# Patient Record
Sex: Female | Born: 1940 | Race: White | Hispanic: No | Smoking: Former smoker
Health system: Southern US, Community
[De-identification: ages and names within clinical notes are randomized; demographics above are authoritative.]

## PROBLEM LIST (undated history)

## (undated) DIAGNOSIS — F32A Depression, unspecified: Secondary | ICD-10-CM

## (undated) DIAGNOSIS — F418 Other specified anxiety disorders: Secondary | ICD-10-CM

## (undated) DIAGNOSIS — K219 Gastro-esophageal reflux disease without esophagitis: Secondary | ICD-10-CM

## (undated) DIAGNOSIS — S7291XA Unspecified fracture of right femur, initial encounter for closed fracture: Secondary | ICD-10-CM

## (undated) DIAGNOSIS — E43 Unspecified severe protein-calorie malnutrition: Secondary | ICD-10-CM

## (undated) DIAGNOSIS — H3530 Unspecified macular degeneration: Secondary | ICD-10-CM

## (undated) DIAGNOSIS — IMO0001 Reserved for inherently not codable concepts without codable children: Secondary | ICD-10-CM

## (undated) DIAGNOSIS — C539 Malignant neoplasm of cervix uteri, unspecified: Secondary | ICD-10-CM

## (undated) DIAGNOSIS — S7290XA Unspecified fracture of unspecified femur, initial encounter for closed fracture: Secondary | ICD-10-CM

## (undated) DIAGNOSIS — M810 Age-related osteoporosis without current pathological fracture: Secondary | ICD-10-CM

## (undated) DIAGNOSIS — S22080D Wedge compression fracture of T11-T12 vertebra, subsequent encounter for fracture with routine healing: Secondary | ICD-10-CM

## (undated) DIAGNOSIS — G250 Essential tremor: Secondary | ICD-10-CM

## (undated) DIAGNOSIS — F332 Major depressive disorder, recurrent severe without psychotic features: Secondary | ICD-10-CM

## (undated) DIAGNOSIS — G621 Alcoholic polyneuropathy: Secondary | ICD-10-CM

## (undated) DIAGNOSIS — F329 Major depressive disorder, single episode, unspecified: Secondary | ICD-10-CM

## (undated) HISTORY — DX: Unspecified severe protein-calorie malnutrition: E43

## (undated) HISTORY — DX: Reserved for inherently not codable concepts without codable children: IMO0001

## (undated) HISTORY — DX: Alcoholic polyneuropathy: G62.1

## (undated) HISTORY — PX: ANKLE SURGERY: SHX546

## (undated) HISTORY — PX: CATARACT EXTRACTION: SUR2

## (undated) HISTORY — DX: Major depressive disorder, recurrent severe without psychotic features: F33.2

## (undated) HISTORY — DX: Unspecified macular degeneration: H35.30

## (undated) HISTORY — DX: Other specified anxiety disorders: F41.8

## (undated) HISTORY — DX: Malignant neoplasm of cervix uteri, unspecified: C53.9

## (undated) HISTORY — DX: Age-related osteoporosis without current pathological fracture: M81.0

## (undated) HISTORY — DX: Essential tremor: G25.0

## (undated) HISTORY — PX: TONSILLECTOMY: SHX5217

## (undated) HISTORY — DX: Gastro-esophageal reflux disease without esophagitis: K21.9

## (undated) HISTORY — DX: Major depressive disorder, single episode, unspecified: F32.9

## (undated) HISTORY — DX: Unspecified fracture of unspecified femur, initial encounter for closed fracture: S72.90XA

## (undated) HISTORY — DX: Unspecified fracture of right femur, initial encounter for closed fracture: S72.91XA

## (undated) HISTORY — DX: Depression, unspecified: F32.A

## (undated) HISTORY — DX: Wedge compression fracture of t11-T12 vertebra, subsequent encounter for fracture with routine healing: S22.080D

---

## 1978-06-08 HISTORY — PX: CERVIX SURGERY: SHX593

## 2002-10-12 ENCOUNTER — Encounter: Payer: Self-pay | Admitting: Urology

## 2002-10-18 ENCOUNTER — Inpatient Hospital Stay (HOSPITAL_COMMUNITY): Admission: RE | Admit: 2002-10-18 | Discharge: 2002-10-20 | Payer: Self-pay | Admitting: Urology

## 2002-10-19 ENCOUNTER — Encounter: Payer: Self-pay | Admitting: Urology

## 2003-03-18 ENCOUNTER — Inpatient Hospital Stay (HOSPITAL_COMMUNITY): Admission: EM | Admit: 2003-03-18 | Discharge: 2003-03-22 | Payer: Self-pay | Admitting: Emergency Medicine

## 2003-03-18 ENCOUNTER — Encounter: Payer: Self-pay | Admitting: Orthopedic Surgery

## 2003-03-18 ENCOUNTER — Encounter: Payer: Self-pay | Admitting: Emergency Medicine

## 2003-06-09 HISTORY — PX: FEMUR SURGERY: SHX943

## 2004-06-08 HISTORY — PX: HIP SURGERY: SHX245

## 2005-08-13 ENCOUNTER — Ambulatory Visit: Payer: Self-pay | Admitting: Physical Medicine & Rehabilitation

## 2005-08-13 ENCOUNTER — Inpatient Hospital Stay (HOSPITAL_COMMUNITY): Admission: AD | Admit: 2005-08-13 | Discharge: 2005-08-17 | Payer: Self-pay | Admitting: Orthopedic Surgery

## 2005-09-04 ENCOUNTER — Ambulatory Visit (HOSPITAL_COMMUNITY): Admission: RE | Admit: 2005-09-04 | Discharge: 2005-09-04 | Payer: Self-pay | Admitting: Internal Medicine

## 2011-03-27 ENCOUNTER — Encounter (INDEPENDENT_AMBULATORY_CARE_PROVIDER_SITE_OTHER): Payer: Self-pay | Admitting: General Surgery

## 2011-03-27 ENCOUNTER — Ambulatory Visit (INDEPENDENT_AMBULATORY_CARE_PROVIDER_SITE_OTHER): Payer: Medicare Other | Admitting: General Surgery

## 2011-03-27 VITALS — BP 122/70 | HR 80 | Temp 98.0°F | Resp 20 | Ht 62.5 in | Wt 111.4 lb

## 2011-03-27 DIAGNOSIS — L723 Sebaceous cyst: Secondary | ICD-10-CM

## 2011-03-27 NOTE — Progress Notes (Signed)
Subjective:     Patient ID: Jody Moore, female   DOB: 12/29/1940, 70 y.o.   MRN: [id number]  HPIThe patient is referred for a little sebaceous cyst on the left neck posterior supraclavicular area it has been present for 2 more years and has never been tender. This was ultrasound ordered by the referring physician shows that as a sebaceous cyst the patient said that she would like to get the area removed today if possible. She is not on a type of blood thinners and we were able to remove it with local anesthesia in the minor surgery room to day  Review of Systems No current outpatient prescriptions on file.       Objective:   Physical ExamBP 122/70  Pulse 80  Temp 98 F (36.7 C)  Resp 20  Ht 5' 2.5" (1.588 m)  Wt 111 lb 6 oz (50.519 kg)  BMI 20.05 kg/m2  Patient has approximately 2-3 cm sebaceous cyst on the left neck area superficial and I excised it with local anesthesia in the minor surgery room. I did have some of the debris since the overlying skin was then but very minimal bleeding and put 3 5-0 nylon sutures to Approximate the wound. She'll start washing the air and upon little Neosporin tomorrow and return to see me for suture removal in one week we did not send the tissue to be examined since it was obviously a sebaceous cyst that does not appear to be actively infected but it did have a lot of odor and the skin debris    Assessment:     Removal of sebaceous cyst left neck with local anesthesia    Plan:     One week

## 2011-03-27 NOTE — Patient Instructions (Signed)
Washed area daily starting tomorrow and apply a little but of Neosporin to the incision I will remove her sutures in approximately one way and we will not send the tissue for pathology examination.

## 2011-04-03 ENCOUNTER — Ambulatory Visit (INDEPENDENT_AMBULATORY_CARE_PROVIDER_SITE_OTHER): Payer: Medicare Other | Admitting: General Surgery

## 2011-04-03 ENCOUNTER — Encounter (INDEPENDENT_AMBULATORY_CARE_PROVIDER_SITE_OTHER): Payer: Self-pay | Admitting: General Surgery

## 2011-04-03 VITALS — BP 126/62 | HR 68 | Temp 98.6°F | Resp 18 | Ht 62.5 in | Wt 111.0 lb

## 2011-04-03 DIAGNOSIS — L723 Sebaceous cyst: Secondary | ICD-10-CM

## 2011-04-03 NOTE — Patient Instructions (Signed)
Keep the Steri-Strips on for approximately one we. If you have increased swelling or tenderness please call for followup appointment otherwise see [place] on a p.r.n. basis

## 2011-04-03 NOTE — Progress Notes (Signed)
Subjective:     Patient ID: Jody Moore, female   DOB: November 06, 1940, 70 y.o.   MRN: [id number]  HPIPatient is here for suture removal she had a 3 cm sebaceous cyst of the left neck the removed here in the office and local anesthesia last Friday we did not sent for path report since there was obviously a big sebaceous cyst which she's material and I placed 3 5-0 nylon sutures incision appears to be unsatisfactory and more sutures placed 2 half-inch Steri-Strips quadrant Steri-Strips and hopefully the strips was then placed in one-week she can see [place] on a p.r.n. basis   Review of Systems     Objective:   Physical Exam BP 126/62  Pulse 68  Temp(Src) 98.6 F (37 C) (Temporal)  Resp 18  Ht 5' 2.5" (1.588 m)  Wt 111 lb (50.349 kg)  BMI 19.98 kg/m2     Assessment:    Satisfactory healing now one week status post removal of a large inflamed sebaceous cyst it did present to 3 years from the left lower neck     Plan:     Return prn

## 2013-10-04 ENCOUNTER — Ambulatory Visit (INDEPENDENT_AMBULATORY_CARE_PROVIDER_SITE_OTHER): Payer: Medicare Other | Admitting: Neurology

## 2013-10-04 ENCOUNTER — Encounter: Payer: Self-pay | Admitting: Neurology

## 2013-10-04 VITALS — BP 148/78 | HR 60 | Resp 14 | Ht 62.5 in | Wt 108.0 lb

## 2013-10-04 DIAGNOSIS — G621 Alcoholic polyneuropathy: Secondary | ICD-10-CM

## 2013-10-04 DIAGNOSIS — G252 Other specified forms of tremor: Secondary | ICD-10-CM

## 2013-10-04 DIAGNOSIS — G250 Essential tremor: Secondary | ICD-10-CM

## 2013-10-04 HISTORY — DX: Alcoholic polyneuropathy: G62.1

## 2013-10-04 HISTORY — DX: Essential tremor: G25.0

## 2013-10-04 NOTE — Progress Notes (Signed)
Note faxed to [facility] at [phone]. Confirmation received.

## 2013-10-04 NOTE — Patient Instructions (Signed)
1.  Let us know if tremor gets bad enough that you decide you want treatment 2.  You need to slowly taper your alcohol use and ultimately discontinue it.  I would recommend you talk to your primary care physician about how to do this safely
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
## 2014-05-14 ENCOUNTER — Telehealth: Payer: Self-pay | Admitting: Neurology

## 2014-05-14 NOTE — Telephone Encounter (Signed)
Called and made sister aware that we do not have a signed release on file so I can not discuss her sister's medical information with her. She expressed understanding. States she thinks her sister is depressed, so she will encourage her to call us but she doesn't think she will. I encouraged that if patient is having a change in symptoms, they should make an appt to come in and discuss.

## 2014-05-14 NOTE — Telephone Encounter (Signed)
Is this new or the same problem she was having in April?  Make sure you have a release to talk with sister

## 2014-05-14 NOTE — Telephone Encounter (Signed)
Sunday Spillers, pt's sister called wanting a referral for pt for physical therapy due to her weak legs. Please call Sunday Spillers # [phone]

## 2014-05-14 NOTE — Telephone Encounter (Signed)
Have not seen patient since April- Okay to refer?

## 2014-10-09 DIAGNOSIS — Z1211 Encounter for screening for malignant neoplasm of colon: Secondary | ICD-10-CM | POA: Diagnosis not present

## 2014-10-09 DIAGNOSIS — D120 Benign neoplasm of cecum: Secondary | ICD-10-CM | POA: Diagnosis not present

## 2014-10-09 DIAGNOSIS — D128 Benign neoplasm of rectum: Secondary | ICD-10-CM | POA: Diagnosis not present

## 2014-10-09 DIAGNOSIS — D122 Benign neoplasm of ascending colon: Secondary | ICD-10-CM | POA: Diagnosis not present

## 2014-10-09 DIAGNOSIS — D123 Benign neoplasm of transverse colon: Secondary | ICD-10-CM | POA: Diagnosis not present

## 2015-07-10 ENCOUNTER — Ambulatory Visit (INDEPENDENT_AMBULATORY_CARE_PROVIDER_SITE_OTHER): Payer: Medicare Other | Admitting: Internal Medicine

## 2015-07-10 ENCOUNTER — Encounter: Payer: Self-pay | Admitting: Internal Medicine

## 2015-07-10 VITALS — BP 120/72 | HR 77 | Temp 98.5°F | Resp 20 | Ht 63.0 in | Wt 125.8 lb

## 2015-07-10 DIAGNOSIS — M810 Age-related osteoporosis without current pathological fracture: Secondary | ICD-10-CM | POA: Diagnosis not present

## 2015-07-10 DIAGNOSIS — K219 Gastro-esophageal reflux disease without esophagitis: Secondary | ICD-10-CM

## 2015-07-10 DIAGNOSIS — H3530 Unspecified macular degeneration: Secondary | ICD-10-CM | POA: Diagnosis not present

## 2015-07-10 DIAGNOSIS — G621 Alcoholic polyneuropathy: Secondary | ICD-10-CM

## 2015-07-10 DIAGNOSIS — Z1239 Encounter for other screening for malignant neoplasm of breast: Secondary | ICD-10-CM

## 2015-07-10 DIAGNOSIS — Z23 Encounter for immunization: Secondary | ICD-10-CM | POA: Diagnosis not present

## 2015-07-10 DIAGNOSIS — F332 Major depressive disorder, recurrent severe without psychotic features: Secondary | ICD-10-CM | POA: Diagnosis not present

## 2015-07-10 DIAGNOSIS — Z136 Encounter for screening for cardiovascular disorders: Secondary | ICD-10-CM | POA: Diagnosis not present

## 2015-07-10 DIAGNOSIS — E2839 Other primary ovarian failure: Secondary | ICD-10-CM

## 2015-07-10 DIAGNOSIS — G250 Essential tremor: Secondary | ICD-10-CM | POA: Diagnosis not present

## 2015-07-10 HISTORY — DX: Age-related osteoporosis without current pathological fracture: M81.0

## 2015-07-10 HISTORY — DX: Major depressive disorder, recurrent severe without psychotic features: F33.2

## 2015-07-10 HISTORY — DX: Unspecified macular degeneration: H35.30

## 2015-07-10 HISTORY — DX: Gastro-esophageal reflux disease without esophagitis: K21.9

## 2015-07-10 MED ORDER — GABAPENTIN 100 MG PO CAPS: 100.0000 mg | ORAL_CAPSULE | Freq: Every day | ORAL | Status: DC

## 2015-07-10 MED ORDER — PAROXETINE HCL 40 MG PO TABS: 40.0000 mg | ORAL_TABLET | ORAL | Status: DC

## 2015-07-10 NOTE — Patient Instructions (Signed)
Increase paxil 40mg  daily  Start gabapentin 100mg  at bedtime for tremors and neuropathy  Continue other medications as ordered  Avoid alcohol use   Will call with lab results  Will call with imaging referrals  Follow up in 3 mos for CPE/ECG/MMSE

## 2015-07-10 NOTE — Progress Notes (Signed)
Patient ID: Jody Moore, female   DOB: March 05, 1941, 75 y.o.   MRN: [id number]    Location:    [facility]   Place of Service:   OFFICE  Chief Complaint  Patient presents with  . Establish Care    HPI:  75 yo female seen today as a new pt.  Depression - she feels overwhelmed at times due to frail spouse who has dementia. She takes her spouse's paxil until her appt today. She is having breakthrough sx's and feels she needs a higher dose. She increased anxiety off meds along with shaking tremors. No SI/HI  Osteoporosis - takes Calcium supplements. Last DXA about 4 yrs ago.  GERD - stable. Takes tums as Omeprazole causes hives  Hx cervical cancer - she was tx with XRT with radium implants and 2 weeks of cobalt in the 1970s.. She has been cancer free x many years  Macular degeneration - followed by Dr [place]. She had OU cats with IOL about 3 yrs ago. Vision is impaired  Essential tremor - worsening. Has seen Neurology in the past. It is difficult to sign paperwork. 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 She stopped heavy drinking in 2015. She has unstable gait due to arthritic condition from right hip fx that did not heal properly. Uses cane to ambulate  Past Medical History  Diagnosis Date  . Osteoporosis   . Reflux   . Depression   . Cervical cancer [facility])     Past Surgical History  Procedure Laterality Date  . Tonsillectomy    . Femur surgery  2005    fractured right femur   . Hip surgery  2006    fractured right hip   . Cervix surgery  1980    cancer-radiation   . Ankle surgery    . Cataract extraction      Patient Care Team: Merrilee Seashore, MD as PCP - General (Internal Medicine)  Social History   Social History  . Marital Status: Single    Spouse Name: N/A  . Number of Children: N/A  . Years of Education: N/A   Occupational History  . 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 Smoking status: Former  Smoker -- 2.00 packs/day    Types: Cigarettes    Quit date: 06/08/2013  . Smokeless tobacco: Never Used  . Alcohol Use: Yes     Comment: 3-4 glasses wine/day  . Drug Use: No  . Sexual Activity: Not on file   Other Topics Concern  . Not on file   Social History Narrative   Diet:   Do you drink/eat things with caffeine? yes   Marital status:  Married                            What year were you married? 2015   Do you live in a house, apartment, assisted living, condo, trailer, etc)? house   Is it one or more stories? yes   How many persons live in your home? 2  Do you have any pets in your home?  2 cats   Current or past profession: Hospital doctor   Do you exercise?    no                                                 Type & how often:   Do you have a living will?  no   Do you have a DNR Form?  no   Do you have a POA/HPOA forms?  no        reports that she quit smoking about 2 years ago. Her smoking use included Cigarettes. She smoked 2.00 packs per day. She has never used smokeless tobacco. She reports that she drinks alcohol. She reports that she does not use illicit drugs.  Family History  Problem Relation Age of Onset  . Heart disease Father    Family Status  Relation Status Death Age  . Mother Alive     HTN, diabetes  . Father Deceased 6    cancer  . Sister Alive     healthy     There is no immunization history on file for this patient.  Allergies  Allergen Reactions  . Coumadin [Warfarin Sodium] Rash    Medications: Patient's Medications  New Prescriptions   No medications on file  Previous Medications   CALCIUM CARBONATE-VITAMIN D (CALCIUM + D PO)    Take by mouth.   MULTIPLE VITAMIN (MULTIVITAMIN) TABLET    Take 1 tablet by mouth daily.   OMEPRAZOLE (PRILOSEC) 40 MG CAPSULE    Take 40 mg by mouth daily.    PAROXETINE (PAXIL) 20 MG TABLET    Take 20 mg by  mouth daily.  Modified Medications   No medications on file  Discontinued Medications   No medications on file    Review of Systems  Constitutional: Positive for fatigue.  HENT: Positive for dental problem (wears dentures).        Loss of taste   Eyes: Positive for visual disturbance (due to macular degeneration).       Dry eyes  Gastrointestinal: Positive for diarrhea.  Genitourinary:       Weak urine stream  Skin:       itchy  Neurological: Positive for tremors and weakness.       Loss of balance  Hematological: Bruises/bleeds easily.  Psychiatric/Behavioral: Positive for dysphoric mood (depression; mood swings).  All other systems reviewed and are negative.   Filed Vitals:   07/10/15 1102  BP: 120/72  Pulse: 77  Temp: 98.5 F (36.9 C)  TempSrc: Oral  Resp: 20  Height: 5\' 3"  (1.6 m)  Weight: 125 lb 12.8 oz (57.063 kg)  SpO2: 93%   Body mass index is 22.29 kg/(m^2).  Physical Exam  Constitutional: She is oriented to person, place, and time. She appears well-developed.  Frail appearing in NAD  HENT:  Mouth/Throat: Oropharynx is clear and moist. No oropharyngeal exudate.  Eyes: Pupils are equal, round, and reactive to light. No scleral icterus.  Neck: Neck supple. Carotid bruit is not present. No tracheal deviation present.  Cardiovascular: Normal rate, regular rhythm, normal heart sounds and intact distal pulses.  Exam reveals no gallop and no friction rub.   No murmur heard. No LE edema b/l. no calf TTP.   Pulmonary/Chest: Effort normal and breath sounds normal. No stridor.  No respiratory distress. She has no wheezes. She has no rales.  Abdominal: Soft. Bowel sounds are normal. She exhibits no distension and no mass. There is no hepatomegaly. There is no tenderness. There is no rebound and no guarding.  Musculoskeletal: She exhibits edema and tenderness.  Right hip internally rotated. RLE contracture present  Lymphadenopathy:    She has no cervical adenopathy.    Neurological: She is alert and oriented to person, place, and time. She displays tremor (resting). Gait (unsteady) abnormal.  Skin: Skin is warm and dry. No rash noted.  Psychiatric: She has a normal mood and affect. Her behavior is normal. Judgment and thought content normal.     Labs reviewed: No results found for any previous visit.  No results found.   Assessment/Plan   ICD-9-CM ICD-10-CM   1. Severe episode of recurrent major depressive disorder, without psychotic features ([place]) 296.33 F33.2 CMP     CBC with Differential     PARoxetine (PAXIL) 40 MG tablet  2. Essential tremor 333.1 G25.0 CMP     TSH     gabapentin (NEURONTIN) 100 MG capsule  3. Gastroesophageal reflux disease without esophagitis 530.81 K21.9   4. 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 Osteoporosis 733.00 M81.0 DG Bone Density  6. Macular degeneration of both eyes 362.50 H35.30   7. Screening for cardiovascular condition V81.2 Z13.6 Lipid Panel  8. Breast cancer screening V76.10 Z12.39 MM DIGITAL SCREENING BILATERAL   Increase paxil 40mg  daily  Start gabapentin 100mg  at bedtime for tremors and neuropathy  Pneumovax and influenza vaccine given today  Continue other medications as ordered  Avoid alcohol use   Will call with lab results  Will call with imaging referrals  Follow up in 3 mos for CPE/ECG/MMSE  Tibor Lemmons S. Perlie Gold  [facility] [address] [place], [place] [address] [phone] Cell (Monday-Friday 8 AM - 5 PM) [phone] After 5 PM and follow prompts

## 2015-07-11 ENCOUNTER — Ambulatory Visit (INDEPENDENT_AMBULATORY_CARE_PROVIDER_SITE_OTHER): Payer: Medicare Other

## 2015-07-11 ENCOUNTER — Telehealth: Payer: Self-pay

## 2015-07-11 VITALS — HR 73 | Temp 97.3°F | Resp 20 | Wt 125.8 lb

## 2015-07-11 DIAGNOSIS — T7840XA Allergy, unspecified, initial encounter: Secondary | ICD-10-CM | POA: Diagnosis not present

## 2015-07-11 LAB — CBC WITH DIFFERENTIAL/PLATELET
BASOS ABS: 0.1 10*3/uL (ref 0.0–0.2)
Basos: 1 %
EOS (ABSOLUTE): 0.2 10*3/uL (ref 0.0–0.4)
Eos: 2 %
Hematocrit: 37.4 % (ref 34.0–46.6)
Hemoglobin: 12.1 g/dL (ref 11.1–15.9)
IMMATURE GRANS (ABS): 0 10*3/uL (ref 0.0–0.1)
Immature Granulocytes: 0 %
LYMPHS: 20 %
Lymphocytes Absolute: 2 10*3/uL (ref 0.7–3.1)
MCH: 30.3 pg (ref 26.6–33.0)
MCHC: 32.4 g/dL (ref 31.5–35.7)
MCV: 94 fL (ref 79–97)
Monocytes Absolute: 0.9 10*3/uL (ref 0.1–0.9)
Monocytes: 9 %
NEUTROS ABS: 7 10*3/uL (ref 1.4–7.0)
NEUTROS PCT: 68 %
PLATELETS: 399 10*3/uL — AB (ref 150–379)
RBC: 3.99 x10E6/uL (ref 3.77–5.28)
RDW: 14.3 % (ref 12.3–15.4)
WBC: 10.1 10*3/uL (ref 3.4–10.8)

## 2015-07-11 LAB — COMPREHENSIVE METABOLIC PANEL
A/G RATIO: 1.4 (ref 1.1–2.5)
ALBUMIN: 3.8 g/dL (ref 3.5–4.8)
ALK PHOS: 97 IU/L (ref 39–117)
ALT: 7 IU/L (ref 0–32)
AST: 18 IU/L (ref 0–40)
BILIRUBIN TOTAL: 0.5 mg/dL (ref 0.0–1.2)
BUN / CREAT RATIO: 14 (ref 11–26)
BUN: 9 mg/dL (ref 8–27)
CHLORIDE: 100 mmol/L (ref 96–106)
CO2: 21 mmol/L (ref 18–29)
Calcium: 9.5 mg/dL (ref 8.7–10.3)
Creatinine, Ser: 0.66 mg/dL (ref 0.57–1.00)
GFR calc Af Amer: 101 mL/min/{1.73_m2} (ref >59)
GFR calc non Af Amer: 87 mL/min/{1.73_m2} (ref >59)
GLOBULIN, TOTAL: 2.7 g/dL (ref 1.5–4.5)
Glucose: 93 mg/dL (ref 65–99)
POTASSIUM: 4.7 mmol/L (ref 3.5–5.2)
SODIUM: 141 mmol/L (ref 134–144)
Total Protein: 6.5 g/dL (ref 6.0–8.5)

## 2015-07-11 LAB — HEMOGLOBIN A1C
Est. average glucose Bld gHb Est-mCnc: 128 mg/dL
HEMOGLOBIN A1C: 6.1 % — AB (ref 4.8–5.6)

## 2015-07-11 LAB — TSH: TSH: 1.39 u[IU]/mL (ref 0.450–4.500)

## 2015-07-11 LAB — LIPID PANEL
CHOL/HDL RATIO: 3.1 ratio (ref 0.0–4.4)
Cholesterol, Total: 237 mg/dL — ABNORMAL HIGH (ref 100–199)
HDL: 76 mg/dL (ref >39)
LDL CALC: 132 mg/dL — AB (ref 0–99)
TRIGLYCERIDES: 147 mg/dL (ref 0–149)
VLDL Cholesterol Cal: 29 mg/dL (ref 5–40)

## 2015-07-11 MED ORDER — PREDNISONE 10 MG (21) PO TBPK: 10.0000 mg | ORAL_TABLET | Freq: Every day | ORAL | Status: DC

## 2015-07-11 MED ORDER — METHYLPREDNISOLONE ACETATE 40 MG/ML IJ SUSP
40.0000 mg | Freq: Once | INTRAMUSCULAR | Status: AC
  Administered 2015-07-11: 40 mg via INTRAMUSCULAR

## 2015-07-11 MED ORDER — CETIRIZINE HCL 10 MG PO TABS: 10.0000 mg | ORAL_TABLET | Freq: Every day | ORAL | Status: DC

## 2015-07-11 NOTE — Telephone Encounter (Signed)
Per Dr.Carter: 1.) Nurse Visit for Depo Medrol 40 mg.mL, diagnosis- Allergic reaction  2.) Continue Benadryl as instructed on packaging 3.) Zyrtec OTC 4.) Prednisone dose pack to begin tomorrow 07/12/15 (Friday)  Return office visit if symptoms not resolved after treatment plan.  Patient aware of instructions, scheduled nurse visit for today at 3:15 pm. RX for prednisone and Zyrtec sent to the pharmacy.

## 2015-07-11 NOTE — Telephone Encounter (Signed)
Patient called triage line stating she had a reaction to flu vaccine given yesterday. Patient with redness/sewlling and warmth to the touch at the site of the injection. Patient c/o hives all over and swelling in hands (had to remove wedding band). Patient states last year she had slight trouble with the flu vaccine (soreness and swelling) and was willing to give it another try. Patient will avoid flu vaccine in the future. Patient took benadryl with no relief.   Please advise

## 2015-07-23 NOTE — Addendum Note (Signed)
Addended by: Rafael Bihari A on: 07/23/2015 10:38 AM   Modules accepted: Orders

## 2015-08-02 ENCOUNTER — Telehealth: Payer: Self-pay

## 2015-08-02 ENCOUNTER — Other Ambulatory Visit: Payer: Self-pay | Admitting: Internal Medicine

## 2015-08-02 NOTE — Telephone Encounter (Signed)
Called the patient left message, we received a request from her pharmacy for refill on the Prednisone pack. This was given to her 07/11/15 for the problem she was having, we can't refill it. If she continues to have the problem she needs to call or make an appointment to be seen.

## 2015-08-09 ENCOUNTER — Telehealth: Payer: Self-pay | Admitting: Internal Medicine

## 2015-08-09 NOTE — Telephone Encounter (Signed)
S/w her spouse, Francene Castle, by phone and pt has been having intense itching mostly at night. She awakens with severe bruising and scratching. She also has been c/o no energy and no appetite. He would like to schedule her an appt before her routine visit. Instructed him to call office to schedule appt to further eval pt. She does have a hx Etoh abuse, depression, GERD. She also has a hx cervical cancer. Once seen in office, further recommendations to follow

## 2015-08-12 ENCOUNTER — Ambulatory Visit: Payer: Self-pay

## 2015-08-12 ENCOUNTER — Inpatient Hospital Stay: Admission: RE | Admit: 2015-08-12 | Payer: Self-pay | Source: Ambulatory Visit

## 2015-09-02 ENCOUNTER — Ambulatory Visit
Admission: RE | Admit: 2015-09-02 | Discharge: 2015-09-02 | Disposition: A | Discharge status: Home / Self Care | Payer: Medicare Other | Source: Ambulatory Visit | Attending: Internal Medicine | Admitting: Internal Medicine

## 2015-09-02 DIAGNOSIS — Z780 Asymptomatic menopausal state: Secondary | ICD-10-CM | POA: Diagnosis not present

## 2015-09-02 DIAGNOSIS — Z1239 Encounter for other screening for malignant neoplasm of breast: Secondary | ICD-10-CM

## 2015-09-02 DIAGNOSIS — Z1231 Encounter for screening mammogram for malignant neoplasm of breast: Secondary | ICD-10-CM | POA: Diagnosis not present

## 2015-09-02 DIAGNOSIS — E2839 Other primary ovarian failure: Secondary | ICD-10-CM

## 2015-09-02 DIAGNOSIS — M810 Age-related osteoporosis without current pathological fracture: Secondary | ICD-10-CM | POA: Diagnosis not present

## 2015-10-09 ENCOUNTER — Encounter: Payer: Self-pay | Admitting: Internal Medicine

## 2015-10-09 ENCOUNTER — Ambulatory Visit (INDEPENDENT_AMBULATORY_CARE_PROVIDER_SITE_OTHER): Payer: Medicare Other | Admitting: Internal Medicine

## 2015-10-09 VITALS — BP 110/68 | HR 100 | Temp 98.0°F | Resp 20 | Ht 63.0 in | Wt 107.8 lb

## 2015-10-09 DIAGNOSIS — R61 Generalized hyperhidrosis: Secondary | ICD-10-CM

## 2015-10-09 DIAGNOSIS — F332 Major depressive disorder, recurrent severe without psychotic features: Secondary | ICD-10-CM

## 2015-10-09 DIAGNOSIS — F418 Other specified anxiety disorders: Secondary | ICD-10-CM | POA: Diagnosis not present

## 2015-10-09 DIAGNOSIS — M810 Age-related osteoporosis without current pathological fracture: Secondary | ICD-10-CM | POA: Diagnosis not present

## 2015-10-09 DIAGNOSIS — E2839 Other primary ovarian failure: Secondary | ICD-10-CM | POA: Diagnosis not present

## 2015-10-09 DIAGNOSIS — R7303 Prediabetes: Secondary | ICD-10-CM

## 2015-10-09 DIAGNOSIS — R630 Anorexia: Secondary | ICD-10-CM | POA: Diagnosis not present

## 2015-10-09 DIAGNOSIS — Z87891 Personal history of nicotine dependence: Secondary | ICD-10-CM

## 2015-10-09 DIAGNOSIS — Z0000 Encounter for general adult medical examination without abnormal findings: Secondary | ICD-10-CM

## 2015-10-09 DIAGNOSIS — E785 Hyperlipidemia, unspecified: Secondary | ICD-10-CM

## 2015-10-09 DIAGNOSIS — K219 Gastro-esophageal reflux disease without esophagitis: Secondary | ICD-10-CM | POA: Diagnosis not present

## 2015-10-09 DIAGNOSIS — G250 Essential tremor: Secondary | ICD-10-CM | POA: Diagnosis not present

## 2015-10-09 MED ORDER — BUSPIRONE HCL 15 MG PO TABS: 15.0000 mg | ORAL_TABLET | Freq: Two times a day (BID) | ORAL | Status: DC

## 2015-10-09 MED ORDER — GABAPENTIN 100 MG PO CAPS
200.0000 mg | ORAL_CAPSULE | Freq: Every day | ORAL | Status: DC
Start: 2015-10-09 — End: 2016-07-29

## 2015-10-09 MED ORDER — MIRTAZAPINE 7.5 MG PO TABS: 7.5000 mg | ORAL_TABLET | Freq: Every day | ORAL | Status: DC

## 2015-10-09 NOTE — Progress Notes (Addendum)
Patient ID: TRECA TALLMADGE, female   DOB: [birth year], 75 y.o.   MRN: [id number]   Location:    [facility]   Place of Service:   OFFICE  Provider: DR Arletha Grippe  Patient Care Team: Gildardo Cranker, DO as PCP - General (Internal Medicine)  Extended Emergency Contact Information Primary Emergency Contact: Stewart,Keith Address: [place], [place] [address] [address]nette [place]itter of [place] Phone: [phone] Mobile Phone: [phone] Relation: Spouse Secondary Emergency Contact: Graham,Sylvia Address: [address] [place] [address] [place]nette [place]itter of [place] Phone: [phone] Mobile Phone: [phone] Relation: Sister   Goals of Care: Advanced Directive information Advanced Directives 10/09/2015  Does patient have an advance directive? No  Would patient like information on creating an advanced directive? Yes - Environmental education officer Complaint  Patient presents with  . Annual Exam    Annual Exam  . OTHER    Sister in room with patient    HPI: Patient is a 75 y.o. female seen in today for an annual wellness exam. She is a poor historian due to depression/anxiety. Hx obtained from sister, Sunday Spillers, who is present today and chart.   She c/o at least 2 episodes of drenching night sweat in the same night where she had to change the linen on her bed. She is a past smoker.  Depression - she feels overwhelmed at times due to frail spouse who has dementia. Mood improved on paxil 40mg  daily but she has loss of appetite and not eating and has lost 15 Lbs since last OV. No SI/HI. Increased anxiety with interruption of sleep, hives, HA, nausea, diarrhea, increased weakness. She attempts to exercise with TV program to gain strength.  Osteoporosis - takes Calcium supplements. Last DXA in March 2017.  GERD - stable. Takes tums as Omeprazole causes hives  Hx cervical cancer - she was tx with XRT with radium implants and 2 weeks of cobalt  in the 1970s.. She has been cancer free x many years  Macular degeneration - followed by Dr [place]. She had OU cats with IOL about 3 yrs ago. Vision is impaired  Essential tremor - worsening. Has seen Neurology in the past. It is difficult to sign paperwork. 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 She stopped heavy drinking in 2015. She has unstable gait due to arthritic condition from right hip fx that did not heal properly. She has a short right leg. Uses cane to ambulate. A1c 6.1%   Depression screen [facility] 2/9 10/09/2015  Decreased Interest 0  Down, Depressed, Hopeless 0  PHQ - 2 Score 0    Fall Risk  10/09/2015  Falls in the past year? No   MMSE - Mini Mental State Exam 10/09/2015  Orientation to time 5  Orientation to Place 5  Registration 3  Attention/ Calculation 5  Recall 3  Language- name 2 objects 2  Language- repeat 1  Language- follow 3 step command 3  Language- read & follow direction 1  Write a sentence 1  Copy design 1  Total score 30     Health Maintenance  Topic Date Due  . TETANUS/TDAP  03/23/1960  . COLONOSCOPY  03/24/1991  . ZOSTAVAX  03/23/2001  . INFLUENZA VACCINE  01/07/2016  . PNA vac Low Risk Adult (2 of 2 - PCV13) 07/09/2016  .  MAMMOGRAM  09/01/2017  . DEXA SCAN  Completed    Urinary incontinence? none  Functional Status Survey: Is the patient deaf or have difficulty hearing?: No Does the patient have difficulty seeing, even when wearing glasses/contacts?: Yes Does the patient have difficulty concentrating, remembering, or making decisions?: No Does the patient have difficulty walking or climbing stairs?: Yes (uses cane) Does the patient have difficulty dressing or bathing?: Yes Does the patient have difficulty doing errands alone such as visiting a doctor's office or shopping?: Yes (requires transportation) Exercise? Does sit and be fit with TV program  Diet? Makes healthy food choices. Avoids foods  high in sugar  Vision Screening Comments: Was seen at the doctor on 02/07/2015 Dentition: she has upper and partial lower dentures  Pain: in lower back  Past Medical History  Diagnosis Date  . Osteoporosis   . Reflux   . Depression   . Cervical cancer [facility])     Past Surgical History  Procedure Laterality Date  . Tonsillectomy    . Femur surgery  2005    fractured right femur   . Hip surgery  2006    fractured right hip   . Cervix surgery  1980    cancer-radiation   . Ankle surgery    . Cataract extraction      Social History   Social History  . Marital Status: Single    Spouse Name: N/A  . Number of Children: N/A  . Years of Education: N/A   Occupational History  . 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 Smoking status: Former Smoker -- 2.00 packs/day    Types: Cigarettes    Quit date: 06/08/2013  . Smokeless tobacco: Never Used  . Alcohol Use: Yes     Comment: 3-4 glasses wine/day  . Drug Use: No  . Sexual Activity: Not on file   Other Topics Concern  . Not on file   Social History Narrative   Diet:   Do you drink/eat things with caffeine? yes   Marital status:  Married                            What year were you married? 2015   Do you live in a house, apartment, assisted living, condo, trailer, etc)? house   Is it one or more stories? yes   How many persons live in your home? 2                                                                                               Do you have any pets in your home?  2 cats   Current or past profession: Hospital doctor   Do you exercise?    no                                                 Type & how often:  Do you have a living will?  no   Do you have a DNR Form?  no   Do you have a POA/HPOA forms?  no       Allergies  Allergen Reactions  . Influenza Vaccines Hives    Hives/swelling/injection site war to the touch. No relief with benadryl   . Omeprazole Hives  . Coumadin [Warfarin Sodium]  Rash      Medication List       This list is accurate as of: 10/09/15  3:41 PM.  Always use your most recent med list.               CALCIUM + D PO  Take by mouth.     cetirizine 10 MG tablet  Commonly known as:  ZYRTEC  Take 1 tablet (10 mg total) by mouth daily.     gabapentin 100 MG capsule  Commonly known as:  NEURONTIN  Take 1 capsule (100 mg total) by mouth at bedtime.     multivitamin tablet  Take 1 tablet by mouth daily.     PARoxetine 40 MG tablet  Commonly known as:  PAXIL  Take 1 tablet (40 mg total) by mouth every morning.     predniSONE 10 MG (21) Tbpk tablet  Commonly known as:  STERAPRED UNI-PAK 21 TAB  Take 1 tablet (10 mg total) by mouth daily. As directed per package instructions         Review of Systems:  Review of Systems  Unable to perform ROS: Psychiatric disorder    Physical Exam: Filed Vitals:   10/09/15 1453  BP: 110/68  Pulse: 100  Temp: 98 F (36.7 C)  TempSrc: Oral  Resp: 20  Height: 5\' 3"  (1.6 m)  Weight: 107 lb 12.8 oz (48.898 kg)  SpO2: 92%   Body mass index is 19.1 kg/(m^2). Physical Exam  Constitutional: She appears well-developed. No distress.  Frail appearing in NAD.   HENT:  Head: Normocephalic and atraumatic.  Right Ear: Hearing, tympanic membrane, external ear and ear canal normal.  Left Ear: Hearing, tympanic membrane, external ear and ear canal normal.  Mouth/Throat: Uvula is midline, oropharynx is clear and moist and mucous membranes are normal. She does not have dentures.  Eyes: Conjunctivae, EOM and lids are normal. Pupils are equal, round, and reactive to light. No scleral icterus.  (+) diplopia with accomodation  Neck: Trachea normal and normal range of motion. Neck supple. Carotid bruit is not present. No thyroid mass and no thyromegaly present.  Cardiovascular: Normal rate, regular rhythm, normal heart sounds and intact distal pulses.  Exam reveals no gallop and no friction rub.   No murmur heard. No  carotid bruit b/l. No LE edema b/l. No calf TTP.   Pulmonary/Chest: Effort normal and breath sounds normal. She has no wheezes. She has no rhonchi. She has no rales. Right breast exhibits no inverted nipple, no mass, no nipple discharge, no skin change and no tenderness. Left breast exhibits no inverted nipple, no mass, no nipple discharge, no skin change and no tenderness. Breasts are symmetrical.  Abdominal: Soft. Normal appearance, normal aorta and bowel sounds are normal. She exhibits no pulsatile midline mass and no mass. There is no hepatosplenomegaly. There is no tenderness. There is no rigidity, no rebound and no guarding. No hernia.  Musculoskeletal: Normal range of motion. She exhibits edema (small and large joint).  (+) thoracic kyphosis  Lymphadenopathy:       Head (right side): No posterior auricular adenopathy  present.       Head (left side): No posterior auricular adenopathy present.    She has no cervical adenopathy.       Right: No supraclavicular adenopathy present.       Left: No supraclavicular adenopathy present.  Neurological: She is alert. She has normal strength. She displays tremor (resting). No cranial nerve deficit. Gait normal.  Skin: Skin is warm, dry and intact. No rash noted. Nails show no clubbing.  Psychiatric: Her speech is normal and behavior is normal. Thought content normal. Her mood appears anxious. Cognition and memory are normal.    Labs reviewed:  Basic Metabolic Panel:  Recent Labs  07/10/15 1214  NA 141  K 4.7  CL 100  CO2 21  GLUCOSE 93  BUN 9  CREATININE 0.66  CALCIUM 9.5  TSH 1.390   Liver Function Tests:  Recent Labs  07/10/15 1214  AST 18  ALT 7  ALKPHOS 97  BILITOT 0.5  PROT 6.5  ALBUMIN 3.8   No results for input(s): LIPASE, AMYLASE in the last 8760 hours. No results for input(s): AMMONIA in the last 8760 hours. CBC:  Recent Labs  07/10/15 1214  WBC 10.1  NEUTROABS 7.0  HCT 37.4  MCV 94  PLT 399*   Lipid  Panel:  Recent Labs  07/10/15 1214  CHOL 237*  HDL 76  LDLCALC 132*  TRIG 147  CHOLHDL 3.1   Lab Results  Component Value Date   HGBA1C 6.1* 07/10/2015    Procedures: ECG OBTAINED AND REVIEWED BY MYSELF:  NSR @ 96 bpm, LAD,LAE, T wave inversion V1-V4, IVCD. No acute ischemic changes. No other ECG available to compare  Assessment/Plan   ICD-9-CM ICD-10-CM   1. Well adult exam V70.0 Z00.00 EKG 12-Lead  2. Night sweats 780.8 R61 DG Chest 2 View     CMP  3. Loss of appetite with  783.0 R63.0 mirtazapine (REMERON) 7.5 MG tablet  4. Essential tremor 333.1 G25.0 gabapentin (NEURONTIN) 100 MG capsule  5. Situational anxiety 300.09 F41.8 busPIRone (BUSPAR) 15 MG tablet  6. Severe episode of recurrent major depressive disorder, without psychotic features ([place]) 296.33 F33.2 mirtazapine (REMERON) 7.5 MG tablet  7. Osteoporosis 733.00 M81.0   8. Gastroesophageal reflux disease without esophagitis 530.81 K21.9   9. Estrogen deficiency 256.39 E28.39   10. History of tobacco abuse V15.82 Z87.891   11. Hyperlipidemia 272.4 E78.5 Lipid Panel     EKG 12-Lead  12. Prediabetes 790.29 R73.03 Hemoglobin A1c   Pt is UTD on health maintenance. Vaccinations are UTD. Pt maintains a healthy lifestyle. Encouraged pt to exercise 30-45 minutes 4-5 times per week. Eat a well balanced diet. Avoid smoking. Limit alcohol intake. Wear seatbelt when riding in the car. Wear sun block (SPF >50) when spending extended times outside.  Increase gabapentin to 2 caps at bedtime  Start remeron for appetite, depression and insomnia  Start buspar as needed for anxiety  Continue other medications as ordered  Will call with lab results  Get chest xray and will call with results  Follow up in 1 month for depression/anxiety/loss of appetite  Adel Burch S. Perlie Gold  [facility] [address] [place], [place] [address] [phone] Cell (Monday-Friday 8 AM - 5  PM) [phone] After 5 PM and follow prompts

## 2015-10-09 NOTE — Addendum Note (Signed)
Addended by: Gildardo Cranker on: 10/09/2015 10:27 PM   Modules accepted: Miquel Dunn

## 2015-10-09 NOTE — Patient Instructions (Signed)
Encouraged her to exercise 30-45 minutes 4-5 times per week. Eat a well balanced diet. Avoid smoking. Limit alcohol intake. Wear seatbelt when riding in the car. Wear sun block (SPF >50) when spending extended times outside.  Increase gabapentin to 2 caps at bedtime  Start remeron for appetite, depression and insomnia  Start buspar as needed for anxiety  Continue other medications as ordered  Will call with lab results  Get chest xray and will call with results  Follow up in 1 month for depression/anxiety/loss of appetite

## 2015-10-10 LAB — COMPREHENSIVE METABOLIC PANEL
ALT: 9 IU/L (ref 0–32)
AST: 14 IU/L (ref 0–40)
Albumin/Globulin Ratio: 0.9 — ABNORMAL LOW (ref 1.2–2.2)
Albumin: 3.2 g/dL — ABNORMAL LOW (ref 3.5–4.8)
Alkaline Phosphatase: 120 IU/L — ABNORMAL HIGH (ref 39–117)
BUN/Creatinine Ratio: 18 (ref 12–28)
BUN: 10 mg/dL (ref 8–27)
Bilirubin Total: 0.7 mg/dL (ref 0.0–1.2)
CALCIUM: 9 mg/dL (ref 8.7–10.3)
CO2: 19 mmol/L (ref 18–29)
Chloride: 95 mmol/L — ABNORMAL LOW (ref 96–106)
Creatinine, Ser: 0.57 mg/dL (ref 0.57–1.00)
GFR, EST AFRICAN AMERICAN: 106 mL/min/{1.73_m2} (ref >59)
GFR, EST NON AFRICAN AMERICAN: 92 mL/min/{1.73_m2} (ref >59)
GLUCOSE: 94 mg/dL (ref 65–99)
Globulin, Total: 3.4 g/dL (ref 1.5–4.5)
Potassium: 4.1 mmol/L (ref 3.5–5.2)
Sodium: 136 mmol/L (ref 134–144)
TOTAL PROTEIN: 6.6 g/dL (ref 6.0–8.5)

## 2015-10-10 LAB — LIPID PANEL
CHOL/HDL RATIO: 2.1 ratio (ref 0.0–4.4)
Cholesterol, Total: 184 mg/dL (ref 100–199)
HDL: 87 mg/dL (ref >39)
LDL CALC: 76 mg/dL (ref 0–99)
Triglycerides: 107 mg/dL (ref 0–149)
VLDL CHOLESTEROL CAL: 21 mg/dL (ref 5–40)

## 2015-10-10 LAB — HEMOGLOBIN A1C
ESTIMATED AVERAGE GLUCOSE: 123 mg/dL
HEMOGLOBIN A1C: 5.9 % — AB (ref 4.8–5.6)

## 2015-11-05 ENCOUNTER — Encounter: Payer: Self-pay | Admitting: Internal Medicine

## 2015-11-13 ENCOUNTER — Ambulatory Visit: Payer: Medicare Other | Admitting: Internal Medicine

## 2016-04-23 ENCOUNTER — Other Ambulatory Visit: Payer: Self-pay | Admitting: Internal Medicine

## 2016-04-23 DIAGNOSIS — F332 Major depressive disorder, recurrent severe without psychotic features: Secondary | ICD-10-CM

## 2016-05-20 ENCOUNTER — Other Ambulatory Visit: Payer: Self-pay | Admitting: Internal Medicine

## 2016-05-20 DIAGNOSIS — F332 Major depressive disorder, recurrent severe without psychotic features: Secondary | ICD-10-CM

## 2016-07-08 ENCOUNTER — Other Ambulatory Visit: Payer: Self-pay | Admitting: Internal Medicine

## 2016-07-08 ENCOUNTER — Telehealth: Payer: Self-pay

## 2016-07-08 DIAGNOSIS — F332 Major depressive disorder, recurrent severe without psychotic features: Secondary | ICD-10-CM

## 2016-07-08 NOTE — Telephone Encounter (Signed)
Pending appointment 07/29/16

## 2016-07-08 NOTE — Telephone Encounter (Signed)
Spoke with patient and informed her she was due for a follow-up in June 2017, in order to continue refilling medications patient will need to schedule a follow-up appointment as instructed by Dr.Carter.  Patient verbalized understanding and schedule an appointment for 07/29/16

## 2016-07-29 ENCOUNTER — Encounter: Payer: Self-pay | Admitting: Internal Medicine

## 2016-07-29 ENCOUNTER — Ambulatory Visit (INDEPENDENT_AMBULATORY_CARE_PROVIDER_SITE_OTHER): Payer: Medicare Other | Admitting: Internal Medicine

## 2016-07-29 VITALS — BP 124/78 | HR 68 | Temp 98.5°F | Ht 63.0 in | Wt 106.8 lb

## 2016-07-29 DIAGNOSIS — G250 Essential tremor: Secondary | ICD-10-CM

## 2016-07-29 DIAGNOSIS — R7303 Prediabetes: Secondary | ICD-10-CM | POA: Diagnosis not present

## 2016-07-29 DIAGNOSIS — F332 Major depressive disorder, recurrent severe without psychotic features: Secondary | ICD-10-CM

## 2016-07-29 DIAGNOSIS — F418 Other specified anxiety disorders: Secondary | ICD-10-CM | POA: Diagnosis not present

## 2016-07-29 DIAGNOSIS — G621 Alcoholic polyneuropathy: Secondary | ICD-10-CM

## 2016-07-29 DIAGNOSIS — M810 Age-related osteoporosis without current pathological fracture: Secondary | ICD-10-CM

## 2016-07-29 DIAGNOSIS — Z79899 Other long term (current) drug therapy: Secondary | ICD-10-CM

## 2016-07-29 HISTORY — DX: Other specified anxiety disorders: F41.8

## 2016-07-29 LAB — COMPLETE METABOLIC PANEL WITH GFR
ALT: 14 U/L (ref 6–29)
AST: 22 U/L (ref 10–35)
Albumin: 3.9 g/dL (ref 3.6–5.1)
Alkaline Phosphatase: 64 U/L (ref 33–130)
BILIRUBIN TOTAL: 1 mg/dL (ref 0.2–1.2)
BUN: 9 mg/dL (ref 7–25)
CO2: 23 mmol/L (ref 20–31)
Calcium: 9.5 mg/dL (ref 8.6–10.4)
Chloride: 108 mmol/L (ref 98–110)
Creat: 0.76 mg/dL (ref 0.60–0.93)
GFR, EST AFRICAN AMERICAN: 89 mL/min (ref >=60)
GFR, EST NON AFRICAN AMERICAN: 77 mL/min (ref >=60)
Glucose, Bld: 96 mg/dL (ref 65–99)
POTASSIUM: 5 mmol/L (ref 3.5–5.3)
Sodium: 139 mmol/L (ref 135–146)
TOTAL PROTEIN: 6.6 g/dL (ref 6.1–8.1)

## 2016-07-29 MED ORDER — ATENOLOL 25 MG PO TABS: 12.5000 mg | ORAL_TABLET | Freq: Every day | ORAL | 2 refills | Status: DC

## 2016-07-29 MED ORDER — PAROXETINE HCL 40 MG PO TABS: 40.0000 mg | ORAL_TABLET | Freq: Every day | ORAL | 3 refills | Status: DC

## 2016-07-29 MED ORDER — TETANUS-DIPHTH-ACELL PERTUSSIS 5-2.5-18.5 LF-MCG/0.5 IM SUSP: 0.5000 mL | Freq: Once | INTRAMUSCULAR | 0 refills | Status: AC

## 2016-07-29 NOTE — Patient Instructions (Addendum)
START ATENOLOL take 1/2 tablet daily for tremors  Continue other medications as ordered  Follow up in 3 - 4 mos for AWV/CPE/ECG. Fasting labs prior to appt

## 2016-07-29 NOTE — Progress Notes (Signed)
Patient ID: Jody Moore, female   DOB: 27-May-1941, 76 y.o.   MRN: [id number]    Location:  [facility] Place of Service: OFFICE  Chief Complaint  Patient presents with  . Medical Management of Chronic Issues    9 months routine visit    HPI:  76 yo female seen today. She has not been seen since May 2017. Took 1 tab of remeron and noticed increased anger and fatigue and she never took another dose.  She is a poor historian due to depression/anxiety. Hx obtained from chart.   Depression - she feels overwhelmed at times due to frail spouse who has dementia. Mood improved on paxil 28m daily but she has loss of appetite and not eating and has lost 15 Lbs since last OV. No SI/HI. Increased anxiety with interruption of sleep, hives, HA, nausea, diarrhea, increased weakness. She does not recall trying buspar at all when it was rx at last OV. She attempts to exercise with TV program to gain strength.  Osteoporosis - takes Calcium supplements. Last DXA in March 2017.  GERD - stable. Takes tums as Omeprazole causes hives  Hx cervical cancer - she was tx with XRT with radium implants and 2 weeks of cobalt in the 1970s. She has been cancer free x many years  Macular degeneration - followed by Dr [place] She had OU cats with IOL about 3 yrs ago. Vision is impaired  Essential tremor - worsening. Has seen Neurology in the past. It is difficult to sign paperwork. She tried gabapentin x 3-4 weeks but did not see any change in tremor and she felt "spacey".  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 She stopped heavy drinking in 2015. She has unstable gait due to arthritic condition from right hip fx that did not heal properly. She has a short right leg. Uses cane to ambulate.   Prediabetes- A1c 5.9%. Diet controlled  Hx tobacco/Etoh abuse - stopped smoking in 2015; stopped drinking in 2016.   Past Medical History:  Diagnosis Date  . Cervical cancer ([facility]   . Depression   . Osteoporosis     . Reflux     Past Surgical History:  Procedure Laterality Date  . ANKLE SURGERY    . CATARACT EXTRACTION    . [place]  cancer-radiation   . FEMUR SURGERY  2005   fractured right femur   . HIP SURGERY  2006   fractured right hip   . TONSILLECTOMY      Patient Care Team: MGildardo Cranker DO as PCP - General (Internal Medicine)  Social History   Social History  . Marital status: Single    Spouse name: N/A  . Number of children: N/A  . Years of education: N/A   Occupational History  . 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 Smoking status: Former Smoker    Packs/day: 2.00    Types: Cigarettes    Quit date: 06/08/2013  . Smokeless tobacco: Never Used  . Alcohol use Yes     Comment: 3-4 glasses wine/day  . Drug use: No  . Sexual activity: Not on file   Other Topics Concern  . Not on file   Social History Narrative   Diet:   Do you drink/eat things with caffeine? yes   Marital status:  Married  What year were you married? 2015   Do you live in a house, apartment, assisted living, condo, trailer, etc)? house   Is it one or more stories? yes   How many persons live in your home? 2                                                                                               Do you have any pets in your home?  2 cats   Current or past profession: Hospital doctor   Do you exercise?    no                                                 Type & how often:   Do you have a living will?  no   Do you have a DNR Form?  no   Do you have a POA/HPOA forms?  no        reports that she quit smoking about 3 years ago. Her smoking use included Cigarettes. She smoked 2.00 packs per day. She has never used smokeless tobacco. She reports that she drinks alcohol. She reports that she does not use drugs.  Family History  Problem Relation Age of Onset  . Heart disease Father    Family Status  Relation Status  . Mother Alive   HTN,  diabetes  . Father Deceased at age 37   cancer  . Sister Alive   healthy     Allergies  Allergen Reactions  . Influenza Vaccines Hives    Hives/swelling/injection site war to the touch. No relief with benadryl   . Omeprazole Hives  . Coumadin [Warfarin Sodium] Rash    Medications: Patient's Medications  New Prescriptions   No medications on file  Previous Medications   CALCIUM CARBONATE-VITAMIN D (CALCIUM + D PO)    Take by mouth.   MULTIPLE VITAMIN (MULTIVITAMIN) TABLET    Take 1 tablet by mouth daily.   PAROXETINE (PAXIL) 40 MG TABLET    TAKE 1 TABLET BY MOUTH EVERY MORNING, APPOINTMENT OVERDUE   TDAP (BOOSTRIX) 5-2.5-18.5 LF-MCG/0.5 INJECTION    Inject 0.5 mLs into the muscle once.  Modified Medications   No medications on file  Discontinued Medications   BUSPIRONE (BUSPAR) 15 MG TABLET    Take 1 tablet (15 mg total) by mouth 2 (two) times daily. Take as needed   CETIRIZINE (ZYRTEC) 10 MG TABLET    Take 1 tablet (10 mg total) by mouth daily.   GABAPENTIN (NEURONTIN) 100 MG CAPSULE    Take 2 capsules (200 mg total) by mouth at bedtime.   MIRTAZAPINE (REMERON) 7.5 MG TABLET    Take 1 tablet (7.5 mg total) by mouth at bedtime.   PREDNISONE (STERAPRED UNI-PAK 21 TAB) 10 MG (21) TBPK TABLET    Take 1 tablet (10 mg total) by mouth daily. As directed per package instructions    Review of Systems  Unable to perform ROS: Psychiatric disorder  Vitals:   07/29/16 1058  BP: 124/78  Pulse: 68  Temp: 98.5 F (36.9 C)  TempSrc: Oral  SpO2: 95%  Weight: 106 lb 12.8 oz (48.4 kg)  Height: 5' 3"  (1.6 m)   Body mass index is 18.92 kg/m.  Physical Exam  Constitutional: She is oriented to person, place, and time. She appears well-developed.  Frail appearing in NAD  HENT:  Mouth/Throat: Oropharynx is clear and moist. No oropharyngeal exudate.  Eyes: Pupils are equal, round, and reactive to light. No scleral icterus.  Neck: Neck supple. Carotid bruit is not present. No  tracheal deviation present.  Cardiovascular: Normal rate, regular rhythm, normal heart sounds and intact distal pulses.  Exam reveals no gallop and no friction rub.   No murmur heard. No LE edema b/l. no calf TTP.   Pulmonary/Chest: Effort normal and breath sounds normal. No stridor. No respiratory distress. She has no wheezes. She has no rales.  Abdominal: Soft. Bowel sounds are normal. She exhibits no distension and no mass. There is no hepatomegaly. There is no tenderness. There is no rebound and no guarding.  Musculoskeletal: She exhibits edema, tenderness and deformity (kyphosis).  Right hip internally rotated. RLE contracture present  Lymphadenopathy:    She has no cervical adenopathy.  Neurological: She is alert and oriented to person, place, and time. She displays tremor (resting). Gait (unsteady) abnormal.  Skin: Skin is warm and dry. No rash noted.  Psychiatric: She has a normal mood and affect. Her behavior is normal. Judgment and thought content normal.     Labs reviewed: No visits with results within 3 Month(s) from this visit.  Latest known visit with results is:  Office Visit on 10/09/2015  Component Date Value Ref Range Status  . Glucose 10/09/2015 94  65 - 99 mg/dL Final  . BUN 10/09/2015 10  8 - 27 mg/dL Final  . Creatinine, Ser 10/09/2015 0.57  0.57 - 1.00 mg/dL Final  . GFR calc non Af Amer 10/09/2015 92  >59 mL/min/1.73 Final  . GFR calc Af Amer 10/09/2015 106  >59 mL/min/1.73 Final  . BUN/Creatinine Ratio 10/09/2015 18  12 - 28 Final  . Sodium 10/09/2015 136  134 - 144 mmol/L Final  . Potassium 10/09/2015 4.1  3.5 - 5.2 mmol/L Final  . Chloride 10/09/2015 95* 96 - 106 mmol/L Final  . CO2 10/09/2015 19  18 - 29 mmol/L Final  . Calcium 10/09/2015 9.0  8.7 - 10.3 mg/dL Final  . Total Protein 10/09/2015 6.6  6.0 - 8.5 g/dL Final  . Albumin 10/09/2015 3.2* 3.5 - 4.8 g/dL Final  . Globulin, Total 10/09/2015 3.4  1.5 - 4.5 g/dL Final  . Albumin/Globulin Ratio  10/09/2015 0.9* 1.2 - 2.2 Final  . Bilirubin Total 10/09/2015 0.7  0.0 - 1.2 mg/dL Final  . Alkaline Phosphatase 10/09/2015 120* 39 - 117 IU/L Final  . AST 10/09/2015 14  0 - 40 IU/L Final  . ALT 10/09/2015 9  0 - 32 IU/L Final  . Cholesterol, Total 10/09/2015 184  100 - 199 mg/dL Final  . Triglycerides 10/09/2015 107  0 - 149 mg/dL Final  . HDL 10/09/2015 87  >39 mg/dL Final  . VLDL Cholesterol Cal 10/09/2015 21  5 - 40 mg/dL Final  . LDL Calculated 10/09/2015 76  0 - 99 mg/dL Final  . Chol/HDL Ratio 10/09/2015 2.1  0.0 - 4.4 ratio units Final   Comment:  T. Chol/HDL Ratio                                             Men  Women                               1/2 Avg.Risk  3.4    3.3                                   Avg.Risk  5.0    4.4                                2X Avg.Risk  9.6    7.1                                3X Avg.Risk 23.4   11.0   . Hgb A1c MFr Bld 10/09/2015 5.9* 4.8 - 5.6 % Final   Comment:          Pre-diabetes: 5.7 - 6.4          Diabetes: >6.4          Glycemic control for adults with diabetes: <7.0   . Est. average glucose Bld gHb Est-m* 10/09/2015 123  mg/dL Final    No results found.   Assessment/Plan   ICD-9-CM ICD-10-CM   1. Severe episode of recurrent major depressive disorder, without psychotic features ([place]) 296.33 F33.2 PARoxetine (PAXIL) 40 MG tablet     CMP with eGFR     Lipid Panel  2. Situational anxiety 300.09 F41.8   3. Prediabetes 790.29 R73.03 CMP with eGFR     BMP with eGFR     Lipid Panel     Hemoglobin A1c  4. Essential tremor 333.1 G25.0 CMP with eGFR     TSH  5. Age-related osteoporosis without current pathological fracture 733.01 M81.0   6. 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 High risk medication use V58.69 Z79.899 BMP with eGFR     ALT     CBC with Differential/Platelets     Lipid Panel   START ATENOLOL take 1/2 tablet daily for  tremors  Continue other medications as ordered  Follow up in 3 - 4 mos for AWV/CPE/ECG. Fasting labs prior to appt    Logan S. Perlie Gold  [facility] [address] [place], [place] [address] [phone] Cell (Monday-Friday 8 AM - 5 PM) [phone] After 5 PM and follow prompts

## 2016-10-29 ENCOUNTER — Other Ambulatory Visit: Payer: Self-pay | Admitting: Internal Medicine

## 2016-10-29 DIAGNOSIS — F332 Major depressive disorder, recurrent severe without psychotic features: Secondary | ICD-10-CM

## 2016-12-03 ENCOUNTER — Encounter: Payer: Self-pay | Admitting: Internal Medicine

## 2016-12-21 ENCOUNTER — Inpatient Hospital Stay (HOSPITAL_COMMUNITY): Payer: Medicare Other

## 2016-12-21 ENCOUNTER — Inpatient Hospital Stay (HOSPITAL_COMMUNITY)
Admission: EM | Admit: 2016-12-21 | Discharge: 2016-12-25 | DRG: 481 | Disposition: A | Discharge status: Skilled Nursing Facility | Payer: Medicare Other | Attending: Internal Medicine | Admitting: Internal Medicine

## 2016-12-21 ENCOUNTER — Encounter (HOSPITAL_COMMUNITY): Payer: Self-pay | Admitting: Emergency Medicine

## 2016-12-21 ENCOUNTER — Emergency Department (HOSPITAL_COMMUNITY): Payer: Medicare Other

## 2016-12-21 DIAGNOSIS — K219 Gastro-esophageal reflux disease without esophagitis: Secondary | ICD-10-CM | POA: Diagnosis not present

## 2016-12-21 DIAGNOSIS — Z681 Body mass index (BMI) 19 or less, adult: Secondary | ICD-10-CM | POA: Diagnosis not present

## 2016-12-21 DIAGNOSIS — E872 Acidosis: Secondary | ICD-10-CM | POA: Diagnosis not present

## 2016-12-21 DIAGNOSIS — E46 Unspecified protein-calorie malnutrition: Secondary | ICD-10-CM | POA: Diagnosis not present

## 2016-12-21 DIAGNOSIS — Z87891 Personal history of nicotine dependence: Secondary | ICD-10-CM

## 2016-12-21 DIAGNOSIS — M4850XA Collapsed vertebra, not elsewhere classified, site unspecified, initial encounter for fracture: Secondary | ICD-10-CM | POA: Diagnosis present

## 2016-12-21 DIAGNOSIS — S0033XA Contusion of nose, initial encounter: Secondary | ICD-10-CM | POA: Diagnosis present

## 2016-12-21 DIAGNOSIS — S72412A Displaced unspecified condyle fracture of lower end of left femur, initial encounter for closed fracture: Secondary | ICD-10-CM | POA: Diagnosis not present

## 2016-12-21 DIAGNOSIS — W19XXXA Unspecified fall, initial encounter: Secondary | ICD-10-CM | POA: Diagnosis not present

## 2016-12-21 DIAGNOSIS — S72401A Unspecified fracture of lower end of right femur, initial encounter for closed fracture: Secondary | ICD-10-CM | POA: Diagnosis not present

## 2016-12-21 DIAGNOSIS — R600 Localized edema: Secondary | ICD-10-CM | POA: Diagnosis not present

## 2016-12-21 DIAGNOSIS — M810 Age-related osteoporosis without current pathological fracture: Secondary | ICD-10-CM | POA: Diagnosis not present

## 2016-12-21 DIAGNOSIS — R488 Other symbolic dysfunctions: Secondary | ICD-10-CM | POA: Diagnosis not present

## 2016-12-21 DIAGNOSIS — S50812A Abrasion of left forearm, initial encounter: Secondary | ICD-10-CM | POA: Diagnosis not present

## 2016-12-21 DIAGNOSIS — E8889 Other specified metabolic disorders: Secondary | ICD-10-CM | POA: Diagnosis present

## 2016-12-21 DIAGNOSIS — D72829 Elevated white blood cell count, unspecified: Secondary | ICD-10-CM | POA: Diagnosis not present

## 2016-12-21 DIAGNOSIS — M4850XD Collapsed vertebra, not elsewhere classified, site unspecified, subsequent encounter for fracture with routine healing: Secondary | ICD-10-CM | POA: Diagnosis not present

## 2016-12-21 DIAGNOSIS — S728X9A Other fracture of unspecified femur, initial encounter for closed fracture: Secondary | ICD-10-CM | POA: Diagnosis not present

## 2016-12-21 DIAGNOSIS — Y92009 Unspecified place in unspecified non-institutional (private) residence as the place of occurrence of the external cause: Secondary | ICD-10-CM

## 2016-12-21 DIAGNOSIS — S0083XA Contusion of other part of head, initial encounter: Secondary | ICD-10-CM | POA: Diagnosis not present

## 2016-12-21 DIAGNOSIS — Z888 Allergy status to other drugs, medicaments and biological substances status: Secondary | ICD-10-CM | POA: Diagnosis not present

## 2016-12-21 DIAGNOSIS — R1311 Dysphagia, oral phase: Secondary | ICD-10-CM | POA: Diagnosis not present

## 2016-12-21 DIAGNOSIS — E876 Hypokalemia: Secondary | ICD-10-CM | POA: Diagnosis not present

## 2016-12-21 DIAGNOSIS — Z8541 Personal history of malignant neoplasm of cervix uteri: Secondary | ICD-10-CM

## 2016-12-21 DIAGNOSIS — Z9181 History of falling: Secondary | ICD-10-CM | POA: Diagnosis not present

## 2016-12-21 DIAGNOSIS — S7290XA Unspecified fracture of unspecified femur, initial encounter for closed fracture: Secondary | ICD-10-CM | POA: Diagnosis present

## 2016-12-21 DIAGNOSIS — W0110XA Fall on same level from slipping, tripping and stumbling with subsequent striking against unspecified object, initial encounter: Secondary | ICD-10-CM | POA: Diagnosis not present

## 2016-12-21 DIAGNOSIS — S0990XA Unspecified injury of head, initial encounter: Secondary | ICD-10-CM | POA: Diagnosis not present

## 2016-12-21 DIAGNOSIS — Z419 Encounter for procedure for purposes other than remedying health state, unspecified: Secondary | ICD-10-CM

## 2016-12-21 DIAGNOSIS — Z23 Encounter for immunization: Secondary | ICD-10-CM | POA: Diagnosis not present

## 2016-12-21 DIAGNOSIS — S7291XA Unspecified fracture of right femur, initial encounter for closed fracture: Secondary | ICD-10-CM

## 2016-12-21 DIAGNOSIS — S0512XA Contusion of eyeball and orbital tissues, left eye, initial encounter: Secondary | ICD-10-CM | POA: Diagnosis present

## 2016-12-21 DIAGNOSIS — G8911 Acute pain due to trauma: Secondary | ICD-10-CM | POA: Diagnosis not present

## 2016-12-21 DIAGNOSIS — E860 Dehydration: Secondary | ICD-10-CM | POA: Diagnosis not present

## 2016-12-21 DIAGNOSIS — Z7982 Long term (current) use of aspirin: Secondary | ICD-10-CM | POA: Diagnosis not present

## 2016-12-21 DIAGNOSIS — M25571 Pain in right ankle and joints of right foot: Secondary | ICD-10-CM

## 2016-12-21 DIAGNOSIS — Z79899 Other long term (current) drug therapy: Secondary | ICD-10-CM | POA: Diagnosis not present

## 2016-12-21 DIAGNOSIS — S299XXA Unspecified injury of thorax, initial encounter: Secondary | ICD-10-CM | POA: Diagnosis not present

## 2016-12-21 DIAGNOSIS — Z9849 Cataract extraction status, unspecified eye: Secondary | ICD-10-CM

## 2016-12-21 DIAGNOSIS — S72401D Unspecified fracture of lower end of right femur, subsequent encounter for closed fracture with routine healing: Secondary | ICD-10-CM | POA: Diagnosis not present

## 2016-12-21 DIAGNOSIS — M19071 Primary osteoarthritis, right ankle and foot: Secondary | ICD-10-CM | POA: Diagnosis not present

## 2016-12-21 DIAGNOSIS — Z887 Allergy status to serum and vaccine status: Secondary | ICD-10-CM | POA: Diagnosis not present

## 2016-12-21 DIAGNOSIS — M5124 Other intervertebral disc displacement, thoracic region: Secondary | ICD-10-CM | POA: Diagnosis not present

## 2016-12-21 DIAGNOSIS — S199XXA Unspecified injury of neck, initial encounter: Secondary | ICD-10-CM | POA: Diagnosis not present

## 2016-12-21 DIAGNOSIS — M25551 Pain in right hip: Secondary | ICD-10-CM | POA: Diagnosis not present

## 2016-12-21 DIAGNOSIS — F329 Major depressive disorder, single episode, unspecified: Secondary | ICD-10-CM | POA: Diagnosis present

## 2016-12-21 DIAGNOSIS — T148XXA Other injury of unspecified body region, initial encounter: Secondary | ICD-10-CM | POA: Diagnosis not present

## 2016-12-21 DIAGNOSIS — S72391A Other fracture of shaft of right femur, initial encounter for closed fracture: Secondary | ICD-10-CM | POA: Diagnosis not present

## 2016-12-21 DIAGNOSIS — S72491A Other fracture of lower end of right femur, initial encounter for closed fracture: Principal | ICD-10-CM | POA: Diagnosis present

## 2016-12-21 DIAGNOSIS — E441 Mild protein-calorie malnutrition: Secondary | ICD-10-CM | POA: Diagnosis present

## 2016-12-21 DIAGNOSIS — R251 Tremor, unspecified: Secondary | ICD-10-CM | POA: Diagnosis present

## 2016-12-21 DIAGNOSIS — M545 Low back pain, unspecified: Secondary | ICD-10-CM

## 2016-12-21 DIAGNOSIS — S0511XA Contusion of eyeball and orbital tissues, right eye, initial encounter: Secondary | ICD-10-CM | POA: Diagnosis present

## 2016-12-21 DIAGNOSIS — M79604 Pain in right leg: Secondary | ICD-10-CM | POA: Diagnosis not present

## 2016-12-21 DIAGNOSIS — R627 Adult failure to thrive: Secondary | ICD-10-CM | POA: Diagnosis not present

## 2016-12-21 DIAGNOSIS — D649 Anemia, unspecified: Secondary | ICD-10-CM | POA: Diagnosis present

## 2016-12-21 DIAGNOSIS — S79911A Unspecified injury of right hip, initial encounter: Secondary | ICD-10-CM | POA: Diagnosis not present

## 2016-12-21 DIAGNOSIS — M6281 Muscle weakness (generalized): Secondary | ICD-10-CM | POA: Diagnosis not present

## 2016-12-21 HISTORY — DX: Unspecified fracture of right femur, initial encounter for closed fracture: S72.91XA

## 2016-12-21 LAB — COMPREHENSIVE METABOLIC PANEL
ALBUMIN: 3.2 g/dL — AB (ref 3.5–5.0)
ALT: 30 U/L (ref 14–54)
AST: 71 U/L — AB (ref 15–41)
Alkaline Phosphatase: 73 U/L (ref 38–126)
Anion gap: 11 (ref 5–15)
BUN: 20 mg/dL (ref 6–20)
CHLORIDE: 105 mmol/L (ref 101–111)
CO2: 22 mmol/L (ref 22–32)
CREATININE: 0.71 mg/dL (ref 0.44–1.00)
Calcium: 8.6 mg/dL — ABNORMAL LOW (ref 8.9–10.3)
GFR calc non Af Amer: >60 mL/min (ref >60)
GLUCOSE: 109 mg/dL — AB (ref 65–99)
Potassium: 3.4 mmol/L — ABNORMAL LOW (ref 3.5–5.1)
SODIUM: 138 mmol/L (ref 135–145)
Total Bilirubin: 0.8 mg/dL (ref 0.3–1.2)
Total Protein: 6.6 g/dL (ref 6.5–8.1)

## 2016-12-21 LAB — CREATININE, SERUM: CREATININE: 0.69 mg/dL (ref 0.44–1.00)

## 2016-12-21 LAB — CBC
HCT: 30.7 % — ABNORMAL LOW (ref 36.0–46.0)
HCT: 29.4 % — ABNORMAL LOW (ref 36.0–46.0)
HEMOGLOBIN: 9.7 g/dL — AB (ref 12.0–15.0)
Hemoglobin: 10.2 g/dL — ABNORMAL LOW (ref 12.0–15.0)
MCH: 31.4 pg (ref 26.0–34.0)
MCH: 31.4 pg (ref 26.0–34.0)
MCHC: 33.2 g/dL (ref 30.0–36.0)
MCHC: 33 g/dL (ref 30.0–36.0)
MCV: 94.5 fL (ref 78.0–100.0)
MCV: 95.1 fL (ref 78.0–100.0)
PLATELETS: 327 10*3/uL (ref 150–400)
PLATELETS: 319 10*3/uL (ref 150–400)
RBC: 3.25 MIL/uL — AB (ref 3.87–5.11)
RBC: 3.09 MIL/uL — AB (ref 3.87–5.11)
RDW: 13.6 % (ref 11.5–15.5)
RDW: 13.8 % (ref 11.5–15.5)
WBC: 12.5 10*3/uL — ABNORMAL HIGH (ref 4.0–10.5)
WBC: 10.8 10*3/uL — AB (ref 4.0–10.5)

## 2016-12-21 LAB — URINALYSIS, ROUTINE W REFLEX MICROSCOPIC
BILIRUBIN URINE: NEGATIVE
Glucose, UA: NEGATIVE mg/dL
KETONES UR: 80 mg/dL — AB
Leukocytes, UA: NEGATIVE
Nitrite: NEGATIVE
Protein, ur: 30 mg/dL — AB
Specific Gravity, Urine: 1.021 (ref 1.005–1.030)
pH: 5 (ref 5.0–8.0)

## 2016-12-21 LAB — ETHANOL

## 2016-12-21 MED ORDER — TETANUS-DIPHTH-ACELL PERTUSSIS 5-2.5-18.5 LF-MCG/0.5 IM SUSP
0.5000 mL | Freq: Once | INTRAMUSCULAR | Status: AC
  Administered 2016-12-21: 0.5 mL via INTRAMUSCULAR
  Filled 2016-12-21: qty 0.5

## 2016-12-21 MED ORDER — SODIUM CHLORIDE 0.9 % IV SOLN
INTRAVENOUS | Status: AC
  Administered 2016-12-21: 22:00:00 via INTRAVENOUS

## 2016-12-21 MED ORDER — FENTANYL CITRATE (PF) 100 MCG/2ML IJ SOLN
50.0000 ug | Freq: Once | INTRAMUSCULAR | Status: AC
  Administered 2016-12-21: 50 ug via INTRAVENOUS
  Filled 2016-12-21: qty 2

## 2016-12-21 MED ORDER — OXYCODONE-ACETAMINOPHEN 5-325 MG PO TABS
1.0000 | ORAL_TABLET | Freq: Once | ORAL | Status: AC
  Administered 2016-12-21: 1 via ORAL
  Filled 2016-12-21: qty 1

## 2016-12-21 MED ORDER — OXYCODONE-ACETAMINOPHEN 5-325 MG PO TABS
1.0000 | ORAL_TABLET | ORAL | Status: DC | PRN
Start: 2016-12-21 — End: 2016-12-25
  Administered 2016-12-22 – 2016-12-24 (×5): 1 via ORAL
  Filled 2016-12-21 (×6): qty 1

## 2016-12-21 MED ORDER — POLYETHYLENE GLYCOL 3350 17 G PO PACK
17.0000 g | PACK | Freq: Every day | ORAL | Status: DC
  Administered 2016-12-21 – 2016-12-23 (×2): 17 g via ORAL
  Filled 2016-12-21 (×3): qty 1

## 2016-12-21 MED ORDER — SENNOSIDES-DOCUSATE SODIUM 8.6-50 MG PO TABS
1.0000 | ORAL_TABLET | Freq: Two times a day (BID) | ORAL | Status: DC
  Administered 2016-12-21 – 2016-12-25 (×5): 1 via ORAL
  Filled 2016-12-21 (×7): qty 1

## 2016-12-21 MED ORDER — PAROXETINE HCL 20 MG PO TABS
40.0000 mg | ORAL_TABLET | Freq: Every day | ORAL | Status: DC
  Administered 2016-12-22 – 2016-12-25 (×4): 40 mg via ORAL
  Filled 2016-12-21 (×5): qty 2

## 2016-12-21 MED ORDER — MORPHINE SULFATE (PF) 4 MG/ML IV SOLN
1.0000 mg | INTRAVENOUS | Status: DC | PRN
  Administered 2016-12-21 – 2016-12-22 (×2): 1 mg via INTRAVENOUS
  Filled 2016-12-21 (×2): qty 1

## 2016-12-21 MED ORDER — ENOXAPARIN SODIUM 40 MG/0.4ML ~~LOC~~ SOLN: 40.0000 mg | SUBCUTANEOUS | Status: DC

## 2016-12-21 NOTE — Progress Notes (Signed)
Call ED RN and requested to bring patient after patient has floor orders, to verify that patient will not have orders for telemetry.

## 2016-12-21 NOTE — ED Notes (Signed)
IT called twice about broken scanner in room

## 2016-12-21 NOTE — ED Notes (Signed)
Bed: WA03 Expected date:  Expected time:  Means of arrival:  Comments: EMS-fall-knee pain

## 2016-12-21 NOTE — H&P (Signed)
History and Physical  SHARLISA HOLLIFIELD NWG:[id number] DOB: [birth year] DOA: 12/21/2016  Referring physician: EDP PCP: Gildardo Cranker, DO   Chief Complaint: fall on Friday and Sunday, not able to ambulate, right leg pain  HPI: Jody Moore is a 76 y.o. female   H/o osteoporosis, depression sent from home by EMS due to fall x1 on Friday and once on Sunday. she has not been able to ambulate since the fall, she has been crowing in the house , she has not been eating much due to not able to get up. she takes antidiarrhea meds to prevent having BM because she could not get up, she finally called 911 today and is brought to the ED.  Patient report the fall is accidental, she denies chest pain, denies syncope, she report no recent illness, no fever, no cough, no chest pain, no dysuria.   ED course: vital stable, no fever, wbc 12.5, hgb 10,  k 3.4, ast 71, ua concentrated , + ketone, cxr lung and cardiac stable but reviewed thoracic vertebral compression deformity, ct right leg "Impacted transverse fracture of the metaphysis of the distal right femur." [facility] consulted, hospitalist called to admit the patient.   Review of Systems:  Detail per HPI, Review of systems are otherwise negative  Past Medical History:  Diagnosis Date  . Cervical cancer ([place])   . Depression   . Osteoporosis   . Reflux    Past Surgical History:  Procedure Laterality Date  . ANKLE SURGERY    . CATARACT EXTRACTION    . [place]   cancer-radiation   . FEMUR SURGERY  2005   fractured right femur   . HIP SURGERY  2006   fractured right hip   . TONSILLECTOMY     Social History:  reports that she quit smoking about 3 years ago. Her smoking use included Cigarettes. She smoked 2.00 packs per day. She has never used smokeless tobacco. She reports that she drinks alcohol. She reports that she does not use drugs. Patient lives at home, independent prior to the fall, not able to function after the  fall  Allergies  Allergen Reactions  . Influenza Vaccines Hives    Hives/swelling/injection site war to the touch. No relief with benadryl   . Omeprazole Hives  . Coumadin [Warfarin Sodium] Rash    Family History  Problem Relation Age of Onset  . Heart disease Father       Prior to Admission medications   Medication Sig Start Date End Date Taking? Authorizing Provider  aspirin 325 MG EC tablet Take 325 mg by mouth daily as needed for pain.    Yes [provider]  Calcium Carbonate-Vitamin D (CALCIUM + D PO) Take 1 tablet by mouth daily.    Yes [provider]  Multiple Vitamin (MULTIVITAMIN) tablet Take 1 tablet by mouth daily.   Yes [provider]  PARoxetine (PAXIL) 40 MG tablet TAKE 1 TABLET(40 MG) BY MOUTH DAILY 10/29/16  Yes Gildardo Cranker, DO  atenolol (TENORMIN) 25 MG tablet Take 0.5 tablets (12.5 mg total) by mouth daily. Patient not taking: Reported on 12/21/2016 07/29/16   Gildardo Cranker, DO    Physical Exam: BP (!) 147/78 (BP Location: Right Arm)   Pulse 75   Temp 97.6 F (36.4 C) (Oral)   Resp 17   Ht 5\' 3"  (1.6 m)   Wt 46.7 kg (103 lb)   SpO2 95%   BMI 18.25 kg/m   General:  Frail, thin, malnourish elderly female , NAD Eyes: PERRL ENT: unremarkable Neck: supple, no JVD Cardiovascular: RRR Respiratory: CTABL Abdomen: soft/ND/ND, positive bowel sounds Skin: scatter ecchymosis, right periorbit, bilateral arms, legs Musculoskeletal:  Right leg shortened, externally rotated, right lower leg and foot significant edematous, decreased sensation right foot, palpable pedal pulse. Point tenderness mid/low back Psychiatric: calm/cooperative Neurologic: aaox3          Labs on Admission:  Basic Metabolic Panel:  Recent Labs Lab 12/21/16 1232  NA 138  K 3.4*  CL 105  CO2 22  GLUCOSE 109*  BUN 20  CREATININE 0.71  CALCIUM 8.6*   Liver Function Tests:  Recent Labs Lab 12/21/16 1232  AST 71*  ALT 30  ALKPHOS 73  BILITOT  0.8  PROT 6.6  ALBUMIN 3.2*   No results for input(s): LIPASE, AMYLASE in the last 168 hours. No results for input(s): AMMONIA in the last 168 hours. CBC:  Recent Labs Lab 12/21/16 1232  WBC 12.5*  HGB 10.2*  HCT 30.7*  MCV 94.5  PLT 327   Cardiac Enzymes: No results for input(s): CKTOTAL, CKMB, CKMBINDEX, TROPONINI in the last 168 hours.  BNP (last 3 results) No results for input(s): BNP in the last 8760 hours.  ProBNP (last 3 results) No results for input(s): PROBNP in the last 8760 hours.  CBG: No results for input(s): GLUCAP in the last 168 hours.  Radiological Exams on Admission: Dg Chest 2 View  Result Date: 12/21/2016 CLINICAL DATA:  [place] 3 days ago.  No reported chest symptoms. EXAM: CHEST  2 VIEW COMPARISON:  08/13/2005. FINDINGS: Normal sized heart. Mild linear density at both lung bases. Approximately 50% lower thoracic vertebral compression deformity. No visible acute fracture lines. No previous lateral view for comparison. IMPRESSION: 1. Approximately 50% lower thoracic vertebral compression deformity, age indeterminate. 2. Mild bibasilar linear atelectasis or scarring. Electronically Signed   By: Claudie Revering M.D.   On: 12/21/2016 13:26   Ct Head Wo Contrast  Result Date: 12/21/2016 CLINICAL DATA:  Fall 3 days ago EXAM: CT HEAD WITHOUT CONTRAST CT CERVICAL SPINE WITHOUT CONTRAST TECHNIQUE: Multidetector CT imaging of the head and cervical spine was performed following the standard protocol without intravenous contrast. Multiplanar CT image reconstructions of the cervical spine were also generated. COMPARISON:  None. FINDINGS: CT HEAD FINDINGS Brain: There are mild chronic ischemic changes in the periventricular white matter. No mass effect, midline shift, or acute hemorrhage. Mild global atrophy appropriate to age. Vascular: No hyperdense vessel or unexpected calcification. Skull: Cranium is intact. Sinuses/Orbits: Visualized paranasal sinuses and mastoid air cells  are clear. Other: Noncontributory CT CERVICAL SPINE FINDINGS Alignment: There is anterolisthesis C4 upon C5 which is related to facet arthropathy. There is reversed cervical lordosis. Skull base and vertebrae: No acute fracture.  No dislocation. Soft tissues and spinal canal: No evidence of spinal hematoma or soft tissue injury. Disc levels: There is severe narrowing of the C5-6 and C6-7 discs with posterior osteophytic ridging. Spinal canal is grossly patent. Upper chest: Emphysema.  No lung mass. Other: Thyroid is unremarkable. IMPRESSION: No acute intracranial pathology. No evidence of cervical spine injury. Chronic changes. Electronically Signed   By: Marybelle Killings M.D.   On: 12/21/2016 13:39   Ct Cervical Spine Wo Contrast  Result Date: 12/21/2016 CLINICAL DATA:  Fall 3 days ago EXAM: CT HEAD WITHOUT CONTRAST CT CERVICAL SPINE WITHOUT CONTRAST TECHNIQUE: Multidetector CT imaging of the head and cervical spine was performed following the standard protocol without intravenous  contrast. Multiplanar CT image reconstructions of the cervical spine were also generated. COMPARISON:  None. FINDINGS: CT HEAD FINDINGS Brain: There are mild chronic ischemic changes in the periventricular white matter. No mass effect, midline shift, or acute hemorrhage. Mild global atrophy appropriate to age. Vascular: No hyperdense vessel or unexpected calcification. Skull: Cranium is intact. Sinuses/Orbits: Visualized paranasal sinuses and mastoid air cells are clear. Other: Noncontributory CT CERVICAL SPINE FINDINGS Alignment: There is anterolisthesis C4 upon C5 which is related to facet arthropathy. There is reversed cervical lordosis. Skull base and vertebrae: No acute fracture.  No dislocation. Soft tissues and spinal canal: No evidence of spinal hematoma or soft tissue injury. Disc levels: There is severe narrowing of the C5-6 and C6-7 discs with posterior osteophytic ridging. Spinal canal is grossly patent. Upper chest:  Emphysema.  No lung mass. Other: Thyroid is unremarkable. IMPRESSION: No acute intracranial pathology. No evidence of cervical spine injury. Chronic changes. Electronically Signed   By: Marybelle Killings M.D.   On: 12/21/2016 13:39   Dg Hip Unilat With Pelvis 2-3 Views Right  Result Date: 12/21/2016 CLINICAL DATA:  Right leg pain following a fall 3 days ago. EXAM: DG HIP (WITH OR WITHOUT PELVIS) 2-3V RIGHT COMPARISON:  Right femur radiographs obtained at the same obtained. FINDINGS: Intramedullary rod and screw fixation of the proximal femur. Old, healed proximal femur fracture, laterally. No acute fracture or dislocation. Diffuse osteopenia. IMPRESSION: No right hip fracture or dislocation. Electronically Signed   By: Claudie Revering M.D.   On: 12/21/2016 13:30   Dg Femur Min 2 Views Right  Result Date: 12/21/2016 CLINICAL DATA:  Right leg pain following a fall 3 days ago. EXAM: RIGHT FEMUR 2 VIEWS COMPARISON:  None. FINDINGS: Intramedullary variety extending the length of the femur with proximal and distal fixation screws. Mildly comminuted fracture of the distal femoral metaphysis with possible extension into the joint space. Posterior displacement and angulation of the distal fragments. Old, healed proximal femur fracture. Diffuse osteopenia. Moderate-sized effusion. Atheromatous arterial calcifications. IMPRESSION: Mildly comminuted fracture of the distal femoral metaphysis with possible extension into the joint space with an associated moderate-sized effusion. Electronically Signed   By: Claudie Revering M.D.   On: 12/21/2016 13:29     Assessment/Plan Present on Admission: **None**  Accidental Fall/right femur fracture: -CT right leg: Impacted transverse fracture of the metaphysis of the distal right femur. -exam right leg externally rotated, edematous , with decreased sensation.  -[facility] Dr Marlou Sa consulted, will keep npo after midnight in case of surgery. -prn analgesics  Right ankle pain,  right leg edema: Will get right ankle xray Right lower extremity venous doppler Pain control.  Vertebral compression fracture and back pain: She report low back pain is progressive, with band like sensation, her back pain was aggravating recently.  On exam she does has point tenderness mid low back, she denies bowel and bladder incontinence.  Will get MRI thoracic and lumber spine Pain control, may need eval for kyphoplasty by IR pending mri result  Leukocytosis: likely from dehydration and stress, no sign of infection Ivf, repeat labs in am  Hypokalemia: replace k, check mag  Normocytic anemia: hgb 10. possible some blood loss from fracture. Repeat cbc in am  Mildly Elevated AST:  -She does report alcohol use, report drinks wine about three times a week, she has not drink any since the fall on Friday. -will check ck level, she might have some muscle injury from the fall -will check hepatitis panel  Dehydration/starvation ketosis (+ ketone in urine) -hydration, regular diet  Malnutrition:  Body mass index is 18.25 kg/m.  Will get nutrition consult   Poor social situation: patient report she takes cares of her husband who has progressive dementia.  Social worker consulted, she agreed to SNF placement inf needed at discharge.  DVT prophylaxis: hold lovenox for now due to possible surgical intervention, foot pump for now  Consultants: orthopedics  Code Status: full   Family Communication:  Patient   Disposition Plan: admit to med surg  Time spent: 75mins  Davionne Mastrangelo MD, PhD [facility] Pager [phone] If 7PM-7AM, please contact night-coverage at [email or web address], password [facility]

## 2016-12-21 NOTE — ED Triage Notes (Addendum)
Patient here via EMS from home with complaints of fall on Friday. Lives with husband who helped her get in chair after fall. Has been sitting in chair since. Pain 10/10 of right leg. Unable to move. Normally walks. Reports that she has broken her right femur before so leg is normally 1 inch shorter than other.  50 mcg Fentanyl given in route

## 2016-12-21 NOTE — ED Notes (Signed)
Patient declined ankle x-rays. MD notified.

## 2016-12-21 NOTE — ED Provider Notes (Signed)
[facility] DEPT Provider Note   CSN: [id number] Arrival date & time: 12/21/16  1107     History   Chief Complaint Chief Complaint  Patient presents with  . Fall  . Leg Pain    HPI Jody Moore is a 76 y.o. female.  75 year old female with PMH including osteoporosis, GERD, depression who p/w R leg pain. Three days ago, she twisted her right ankle and lost her balance, falling to the ground. She hit her head but did not lose consciousness. She has had constant right thigh pain since then, 10/10 in intensity with moving but minimal when still. She has been unable to walk and has been dragging herself around home since then. She received fentanyl by EMS in route. She denies any vomiting, chest or abdominal pain, or other extremity injury. No anticoagulant use. She previously had femur fracture repair by Dr. Marlou Sa.    The history is provided by the patient.  Fall   Knee Pain      Past Medical History:  Diagnosis Date  . Cervical cancer ([place])   . Depression   . Osteoporosis   . Reflux     Patient Active Problem List   Diagnosis Date Noted  . Prediabetes 07/29/2016  . Situational anxiety 07/29/2016  . Gastroesophageal reflux disease without esophagitis 07/10/2015  . Severe episode of recurrent major depressive disorder, without psychotic features ([place]) 07/10/2015  . Osteoporosis 07/10/2015  . 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 Essential tremor 10/04/2013  . 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 ANKLE SURGERY    . CATARACT EXTRACTION    . [place]   cancer-radiation   . FEMUR SURGERY  2005   fractured right femur   . HIP SURGERY  2006   fractured right hip   . TONSILLECTOMY      OB History    No data available       Home Medications    Prior to Admission medications   Medication Sig Start Date End Date Taking? Authorizing Provider  aspirin 325 MG EC tablet  Take 325 mg by mouth daily as needed for pain.    Yes [provider]  Calcium Carbonate-Vitamin D (CALCIUM + D PO) Take 1 tablet by mouth daily.    Yes [provider]  Multiple Vitamin (MULTIVITAMIN) tablet Take 1 tablet by mouth daily.   Yes [provider]  PARoxetine (PAXIL) 40 MG tablet TAKE 1 TABLET(40 MG) BY MOUTH DAILY 10/29/16  Yes Gildardo Cranker, DO  atenolol (TENORMIN) 25 MG tablet Take 0.5 tablets (12.5 mg total) by mouth daily. Patient not taking: Reported on 12/21/2016 07/29/16   Gildardo Cranker, DO    Family History Family History  Problem Relation Age of Onset  . Heart disease Father     Social History Social History  Substance Use Topics  . Smoking status: Former Smoker    Packs/day: 2.00    Types: Cigarettes    Quit date: 06/08/2013  . Smokeless tobacco: Never Used  . Alcohol use Yes     Comment: 3-4 glasses wine/day     Allergies   Influenza vaccines; Omeprazole; and Coumadin [warfarin sodium]   Review of Systems Review of Systems All other systems reviewed and are negative except that which was mentioned in HPI   Physical Exam Updated Vital Signs BP (!) 147/78 (BP Location: Right Arm)   Pulse 75   Temp 97.6  F (36.4 C) (Oral)   Resp 17   Ht 5\' 3"  (1.6 m)   Wt 46.7 kg (103 lb)   SpO2 95%   BMI 18.25 kg/m   Physical Exam  Constitutional: She is oriented to person, place, and time. No distress.  Frail elderly woman awake and comfortable  HENT:  Head: Normocephalic.  B/l periorbital ecchymoses, ecchymosis on nasal bridge  Eyes: Pupils are equal, round, and reactive to light. Conjunctivae are normal.  Neck: Normal range of motion. Neck supple.  Cardiovascular: Normal rate, regular rhythm and intact distal pulses.   Murmur heard.  Systolic murmur is present with a grade of 4/6  Pulmonary/Chest: Effort normal and breath sounds normal.  Abdominal: Soft. Bowel sounds are normal. She exhibits no distension. There is no  tenderness.  Musculoskeletal: She exhibits edema, tenderness and deformity.  Closed deformity of R femur w/ dependent ecchymoses on posterior thigh, R knee; edema of R foot; R leg externally rotated and significantly shortened compared to left  Neurological: She is alert and oriented to person, place, and time. No sensory deficit.  Fluent speech  Skin: Skin is warm and dry.  Ecchymoses b/l knees, scattered R thigh; abrasion down left forearm  Psychiatric: She has a normal mood and affect. Judgment normal.  Nursing note and vitals reviewed.    ED Treatments / Results  Labs (all labs ordered are listed, but only abnormal results are displayed) Labs Reviewed  COMPREHENSIVE METABOLIC PANEL - Abnormal; Notable for the following:       Result Value   Potassium 3.4 (*)    Glucose, Bld 109 (*)    Calcium 8.6 (*)    Albumin 3.2 (*)    AST 71 (*)    All other components within normal limits  CBC - Abnormal; Notable for the following:    WBC 12.5 (*)    RBC 3.25 (*)    Hemoglobin 10.2 (*)    HCT 30.7 (*)    All other components within normal limits  URINALYSIS, ROUTINE W REFLEX MICROSCOPIC - Abnormal; Notable for the following:    APPearance HAZY (*)    Hgb urine dipstick MODERATE (*)    Ketones, ur 80 (*)    Protein, ur 30 (*)    Bacteria, UA RARE (*)    Squamous Epithelial / LPF 0-5 (*)    All other components within normal limits    EKG  EKG Interpretation None       Radiology Dg Chest 2 View  Result Date: 12/21/2016 CLINICAL DATA:  [place] 3 days ago.  No reported chest symptoms. EXAM: CHEST  2 VIEW COMPARISON:  08/13/2005. FINDINGS: Normal sized heart. Mild linear density at both lung bases. Approximately 50% lower thoracic vertebral compression deformity. No visible acute fracture lines. No previous lateral view for comparison. IMPRESSION: 1. Approximately 50% lower thoracic vertebral compression deformity, age indeterminate. 2. Mild bibasilar linear atelectasis or scarring.  Electronically Signed   By: Claudie Revering M.D.   On: 12/21/2016 13:26   Ct Head Wo Contrast  Result Date: 12/21/2016 CLINICAL DATA:  Fall 3 days ago EXAM: CT HEAD WITHOUT CONTRAST CT CERVICAL SPINE WITHOUT CONTRAST TECHNIQUE: Multidetector CT imaging of the head and cervical spine was performed following the standard protocol without intravenous contrast. Multiplanar CT image reconstructions of the cervical spine were also generated. COMPARISON:  None. FINDINGS: CT HEAD FINDINGS Brain: There are mild chronic ischemic changes in the periventricular white matter. No mass effect, midline shift, or acute hemorrhage. Mild  global atrophy appropriate to age. Vascular: No hyperdense vessel or unexpected calcification. Skull: Cranium is intact. Sinuses/Orbits: Visualized paranasal sinuses and mastoid air cells are clear. Other: Noncontributory CT CERVICAL SPINE FINDINGS Alignment: There is anterolisthesis C4 upon C5 which is related to facet arthropathy. There is reversed cervical lordosis. Skull base and vertebrae: No acute fracture.  No dislocation. Soft tissues and spinal canal: No evidence of spinal hematoma or soft tissue injury. Disc levels: There is severe narrowing of the C5-6 and C6-7 discs with posterior osteophytic ridging. Spinal canal is grossly patent. Upper chest: Emphysema.  No lung mass. Other: Thyroid is unremarkable. IMPRESSION: No acute intracranial pathology. No evidence of cervical spine injury. Chronic changes. Electronically Signed   By: Marybelle Killings M.D.   On: 12/21/2016 13:39   Ct Cervical Spine Wo Contrast  Result Date: 12/21/2016 CLINICAL DATA:  Fall 3 days ago EXAM: CT HEAD WITHOUT CONTRAST CT CERVICAL SPINE WITHOUT CONTRAST TECHNIQUE: Multidetector CT imaging of the head and cervical spine was performed following the standard protocol without intravenous contrast. Multiplanar CT image reconstructions of the cervical spine were also generated. COMPARISON:  None. FINDINGS: CT HEAD FINDINGS  Brain: There are mild chronic ischemic changes in the periventricular white matter. No mass effect, midline shift, or acute hemorrhage. Mild global atrophy appropriate to age. Vascular: No hyperdense vessel or unexpected calcification. Skull: Cranium is intact. Sinuses/Orbits: Visualized paranasal sinuses and mastoid air cells are clear. Other: Noncontributory CT CERVICAL SPINE FINDINGS Alignment: There is anterolisthesis C4 upon C5 which is related to facet arthropathy. There is reversed cervical lordosis. Skull base and vertebrae: No acute fracture.  No dislocation. Soft tissues and spinal canal: No evidence of spinal hematoma or soft tissue injury. Disc levels: There is severe narrowing of the C5-6 and C6-7 discs with posterior osteophytic ridging. Spinal canal is grossly patent. Upper chest: Emphysema.  No lung mass. Other: Thyroid is unremarkable. IMPRESSION: No acute intracranial pathology. No evidence of cervical spine injury. Chronic changes. Electronically Signed   By: Marybelle Killings M.D.   On: 12/21/2016 13:39   Dg Hip Unilat With Pelvis 2-3 Views Right  Result Date: 12/21/2016 CLINICAL DATA:  Right leg pain following a fall 3 days ago. EXAM: DG HIP (WITH OR WITHOUT PELVIS) 2-3V RIGHT COMPARISON:  Right femur radiographs obtained at the same obtained. FINDINGS: Intramedullary rod and screw fixation of the proximal femur. Old, healed proximal femur fracture, laterally. No acute fracture or dislocation. Diffuse osteopenia. IMPRESSION: No right hip fracture or dislocation. Electronically Signed   By: Claudie Revering M.D.   On: 12/21/2016 13:30   Dg Femur Min 2 Views Right  Result Date: 12/21/2016 CLINICAL DATA:  Right leg pain following a fall 3 days ago. EXAM: RIGHT FEMUR 2 VIEWS COMPARISON:  None. FINDINGS: Intramedullary variety extending the length of the femur with proximal and distal fixation screws. Mildly comminuted fracture of the distal femoral metaphysis with possible extension into the joint  space. Posterior displacement and angulation of the distal fragments. Old, healed proximal femur fracture. Diffuse osteopenia. Moderate-sized effusion. Atheromatous arterial calcifications. IMPRESSION: Mildly comminuted fracture of the distal femoral metaphysis with possible extension into the joint space with an associated moderate-sized effusion. Electronically Signed   By: Claudie Revering M.D.   On: 12/21/2016 13:29    Procedures Procedures (including critical care time)  Medications Ordered in ED Medications  oxyCODONE-acetaminophen (PERCOCET/ROXICET) 5-325 MG per tablet 1 tablet (not administered)  Tdap (BOOSTRIX) injection 0.5 mL (0.5 mLs Intramuscular Given 12/21/16 1234)  fentaNYL (  SUBLIMAZE) injection 50 mcg (50 mcg Intravenous Given 12/21/16 1542)     Initial Impression / Assessment and Plan / ED Course  I have reviewed the triage vital signs and the nursing notes.  Pertinent labs & imaging results that were available during my care of the patient were reviewed by me and considered in my medical decision making (see chart for details).    Pt w/ R leg injury after fall 3 days ago. She had multiple areas of bruising including her face and right thigh on exam. She was neurologically intact. Vital signs reassuring. Reading labs show hemoglobin 10.2 which is slightly lower than previous. Obtained imaging of leg as well as head and C-spine given trauma on exam.   CT head and C-spine negative acute. Chest x-ray shows thoracic vertebral compression fracture of indeterminate age. Leg imaging shows mildly comminuted fracture of distal femoral metaphysis with possible extension into joint space. I discussed this injury with [place], Dr.Whitfield, who reviewed films and recommended knee immobilizer, non-weightbearing. He will discuss with Dr. Marlou Sa but feels that this injury will likely be non-operative. He did recommend CT for more detailed characterization of injury. Gave fentanyl and  later percocet for pain, updated tetanus. Discussed admission w/ hospitalist, Dr. Erlinda Hong, and pt admitted for further care. Final Clinical Impressions(s) / ED Diagnoses   Final diagnoses:  None    New Prescriptions New Prescriptions   No medications on file     Terril Chestnut, Wenda Overland, MD 12/21/16 1625

## 2016-12-21 NOTE — Progress Notes (Signed)
Orthopedic Tech Progress Note Patient Details:  Jody Moore Nov 01, 1940 [id number]  Ortho Devices Type of Ortho Device: Knee Immobilizer Ortho Device/Splint Interventions: Application   [place] Pink 12/21/2016, 5:34 PM

## 2016-12-22 ENCOUNTER — Encounter (HOSPITAL_COMMUNITY): Payer: Self-pay | Admitting: Certified Registered Nurse Anesthetist

## 2016-12-22 ENCOUNTER — Encounter (HOSPITAL_COMMUNITY)
Admission: EM | Disposition: A | Discharge status: Skilled Nursing Facility | Payer: Self-pay | Source: Home / Self Care | Attending: Internal Medicine

## 2016-12-22 ENCOUNTER — Inpatient Hospital Stay (HOSPITAL_COMMUNITY): Payer: Medicare Other | Admitting: Certified Registered Nurse Anesthetist

## 2016-12-22 ENCOUNTER — Inpatient Hospital Stay (HOSPITAL_COMMUNITY): Payer: Medicare Other

## 2016-12-22 ENCOUNTER — Encounter (HOSPITAL_COMMUNITY): Payer: Medicare Other

## 2016-12-22 DIAGNOSIS — S7290XA Unspecified fracture of unspecified femur, initial encounter for closed fracture: Secondary | ICD-10-CM | POA: Diagnosis present

## 2016-12-22 HISTORY — DX: Unspecified fracture of unspecified femur, initial encounter for closed fracture: S72.90XA

## 2016-12-22 HISTORY — PX: ORIF FEMUR FRACTURE: SHX2119

## 2016-12-22 LAB — SURGICAL PCR SCREEN
MRSA, PCR: NEGATIVE
Staphylococcus aureus: NEGATIVE

## 2016-12-22 LAB — COMPREHENSIVE METABOLIC PANEL
ALT: 28 U/L (ref 14–54)
ANION GAP: 9 (ref 5–15)
AST: 55 U/L — AB (ref 15–41)
Albumin: 2.6 g/dL — ABNORMAL LOW (ref 3.5–5.0)
Alkaline Phosphatase: 60 U/L (ref 38–126)
BILIRUBIN TOTAL: 1 mg/dL (ref 0.3–1.2)
BUN: 16 mg/dL (ref 6–20)
CHLORIDE: 107 mmol/L (ref 101–111)
CO2: 23 mmol/L (ref 22–32)
Calcium: 8.5 mg/dL — ABNORMAL LOW (ref 8.9–10.3)
Creatinine, Ser: 0.59 mg/dL (ref 0.44–1.00)
Glucose, Bld: 101 mg/dL — ABNORMAL HIGH (ref 65–99)
POTASSIUM: 3.3 mmol/L — AB (ref 3.5–5.1)
Sodium: 139 mmol/L (ref 135–145)
TOTAL PROTEIN: 5.6 g/dL — AB (ref 6.5–8.1)

## 2016-12-22 LAB — CBC WITH DIFFERENTIAL/PLATELET
BASOS ABS: 0 10*3/uL (ref 0.0–0.1)
BASOS PCT: 1 %
EOS PCT: 4 %
Eosinophils Absolute: 0.2 10*3/uL (ref 0.0–0.7)
HEMATOCRIT: 27.1 % — AB (ref 36.0–46.0)
Hemoglobin: 9 g/dL — ABNORMAL LOW (ref 12.0–15.0)
LYMPHS PCT: 25 %
Lymphs Abs: 1.6 10*3/uL (ref 0.7–4.0)
MCH: 30.9 pg (ref 26.0–34.0)
MCHC: 33.2 g/dL (ref 30.0–36.0)
MCV: 93.1 fL (ref 78.0–100.0)
MONO ABS: 0.8 10*3/uL (ref 0.1–1.0)
MONOS PCT: 13 %
NEUTROS ABS: 3.7 10*3/uL (ref 1.7–7.7)
Neutrophils Relative %: 57 %
PLATELETS: 291 10*3/uL (ref 150–400)
RBC: 2.91 MIL/uL — ABNORMAL LOW (ref 3.87–5.11)
RDW: 13.7 % (ref 11.5–15.5)
WBC: 6.5 10*3/uL (ref 4.0–10.5)

## 2016-12-22 LAB — PREPARE RBC (CROSSMATCH)

## 2016-12-22 LAB — MAGNESIUM: MAGNESIUM: 1.6 mg/dL — AB (ref 1.7–2.4)

## 2016-12-22 LAB — CK: Total CK: 520 U/L — ABNORMAL HIGH (ref 38–234)

## 2016-12-22 LAB — PROTIME-INR
INR: 1.2
PROTHROMBIN TIME: 15.3 s — AB (ref 11.4–15.2)

## 2016-12-22 SURGERY — OPEN REDUCTION INTERNAL FIXATION (ORIF) DISTAL FEMUR FRACTURE
Anesthesia: General | Site: Leg Lower | Laterality: Right

## 2016-12-22 MED ORDER — SODIUM CHLORIDE 0.9 % IV SOLN
INTRAVENOUS | Status: DC | PRN
  Administered 2016-12-22: 19:00:00 via INTRAVENOUS

## 2016-12-22 MED ORDER — MIDAZOLAM HCL 2 MG/2ML IJ SOLN
INTRAMUSCULAR | Status: AC
  Filled 2016-12-22: qty 2

## 2016-12-22 MED ORDER — PROPOFOL 10 MG/ML IV BOLUS
INTRAVENOUS | Status: DC | PRN
  Administered 2016-12-22: 100 mg via INTRAVENOUS

## 2016-12-22 MED ORDER — METOCLOPRAMIDE HCL 5 MG/ML IJ SOLN: 5.0000 mg | Freq: Three times a day (TID) | INTRAMUSCULAR | Status: DC | PRN

## 2016-12-22 MED ORDER — FENTANYL CITRATE (PF) 250 MCG/5ML IJ SOLN
INTRAMUSCULAR | Status: AC
  Filled 2016-12-22: qty 5

## 2016-12-22 MED ORDER — POTASSIUM CHLORIDE CRYS ER 20 MEQ PO TBCR
40.0000 meq | EXTENDED_RELEASE_TABLET | ORAL | Status: AC
  Administered 2016-12-22 (×2): 40 meq via ORAL
  Filled 2016-12-22 (×2): qty 2

## 2016-12-22 MED ORDER — TRANEXAMIC ACID 1000 MG/10ML IV SOLN
1000.0000 mg | INTRAVENOUS | Status: AC
  Administered 2016-12-22: 1000 mg via INTRAVENOUS
  Filled 2016-12-22: qty 10

## 2016-12-22 MED ORDER — POTASSIUM CHLORIDE IN NACL 20-0.9 MEQ/L-% IV SOLN
INTRAVENOUS | Status: AC
  Administered 2016-12-22: 23:00:00 via INTRAVENOUS
  Filled 2016-12-22 (×2): qty 1000

## 2016-12-22 MED ORDER — MENTHOL 3 MG MT LOZG: 1.0000 | LOZENGE | OROMUCOSAL | Status: DC | PRN

## 2016-12-22 MED ORDER — METOCLOPRAMIDE HCL 5 MG PO TABS: 5.0000 mg | ORAL_TABLET | Freq: Three times a day (TID) | ORAL | Status: DC | PRN

## 2016-12-22 MED ORDER — BUPIVACAINE-EPINEPHRINE (PF) 0.5% -1:200000 IJ SOLN
INTRAMUSCULAR | Status: AC
  Filled 2016-12-22: qty 30

## 2016-12-22 MED ORDER — METHOCARBAMOL 500 MG PO TABS
500.0000 mg | ORAL_TABLET | Freq: Four times a day (QID) | ORAL | Status: DC | PRN
  Administered 2016-12-23 – 2016-12-24 (×3): 500 mg via ORAL
  Filled 2016-12-22 (×3): qty 1

## 2016-12-22 MED ORDER — NALOXONE HCL 0.4 MG/ML IJ SOLN
0.0400 mg | INTRAMUSCULAR | Status: DC | PRN
  Administered 2016-12-22 (×4): 0.04 mg via INTRAVENOUS

## 2016-12-22 MED ORDER — ONDANSETRON HCL 4 MG PO TABS: 4.0000 mg | ORAL_TABLET | Freq: Four times a day (QID) | ORAL | Status: DC | PRN

## 2016-12-22 MED ORDER — LACTATED RINGERS IV SOLN
INTRAVENOUS | Status: DC | PRN
  Administered 2016-12-22: 17:00:00 via INTRAVENOUS

## 2016-12-22 MED ORDER — ONDANSETRON HCL 4 MG/2ML IJ SOLN: 4.0000 mg | Freq: Once | INTRAMUSCULAR | Status: DC | PRN

## 2016-12-22 MED ORDER — VANCOMYCIN HCL 500 MG IV SOLR
INTRAVENOUS | Status: DC | PRN
  Administered 2016-12-22: 500 mg via TOPICAL

## 2016-12-22 MED ORDER — NALOXONE HCL 0.4 MG/ML IJ SOLN
INTRAMUSCULAR | Status: AC
  Filled 2016-12-22: qty 1

## 2016-12-22 MED ORDER — 0.9 % SODIUM CHLORIDE (POUR BTL) OPTIME
TOPICAL | Status: DC | PRN
  Administered 2016-12-22 (×4): 1000 mL

## 2016-12-22 MED ORDER — ONDANSETRON HCL 4 MG/2ML IJ SOLN
INTRAMUSCULAR | Status: AC
  Filled 2016-12-22: qty 6

## 2016-12-22 MED ORDER — MORPHINE SULFATE (PF) 4 MG/ML IV SOLN: 1.0000 mg | INTRAVENOUS | Status: DC | PRN

## 2016-12-22 MED ORDER — TRANEXAMIC ACID 1000 MG/10ML IV SOLN
INTRAVENOUS | Status: AC | PRN
  Administered 2016-12-22: 2000 mg via TOPICAL

## 2016-12-22 MED ORDER — LACTATED RINGERS IV SOLN
INTRAVENOUS | Status: DC
  Administered 2016-12-22 (×2): via INTRAVENOUS

## 2016-12-22 MED ORDER — ALBUMIN HUMAN 5 % IV SOLN
INTRAVENOUS | Status: DC | PRN
  Administered 2016-12-22: 18:00:00 via INTRAVENOUS

## 2016-12-22 MED ORDER — ROCURONIUM BROMIDE 100 MG/10ML IV SOLN
INTRAVENOUS | Status: DC | PRN
  Administered 2016-12-22: 50 mg via INTRAVENOUS

## 2016-12-22 MED ORDER — PHENYLEPHRINE HCL 10 MG/ML IJ SOLN
INTRAVENOUS | Status: DC | PRN
  Administered 2016-12-22: 40 ug/min via INTRAVENOUS

## 2016-12-22 MED ORDER — MAGNESIUM SULFATE 2 GM/50ML IV SOLN
2.0000 g | Freq: Once | INTRAVENOUS | Status: AC
  Administered 2016-12-22: 10:00:00 2 g via INTRAVENOUS
  Filled 2016-12-22: qty 50

## 2016-12-22 MED ORDER — FENTANYL CITRATE (PF) 100 MCG/2ML IJ SOLN: 25.0000 ug | INTRAMUSCULAR | Status: DC | PRN

## 2016-12-22 MED ORDER — METHOCARBAMOL 1000 MG/10ML IJ SOLN
500.0000 mg | Freq: Four times a day (QID) | INTRAVENOUS | Status: DC | PRN
  Filled 2016-12-22: qty 5

## 2016-12-22 MED ORDER — SUGAMMADEX SODIUM 200 MG/2ML IV SOLN
INTRAVENOUS | Status: DC | PRN
  Administered 2016-12-22: 100 mg via INTRAVENOUS

## 2016-12-22 MED ORDER — PHENYLEPHRINE HCL 10 MG/ML IJ SOLN
INTRAMUSCULAR | Status: DC | PRN
  Administered 2016-12-22 (×4): 120 ug via INTRAVENOUS

## 2016-12-22 MED ORDER — ONDANSETRON HCL 4 MG/2ML IJ SOLN: 4.0000 mg | Freq: Four times a day (QID) | INTRAMUSCULAR | Status: DC | PRN

## 2016-12-22 MED ORDER — FENTANYL CITRATE (PF) 100 MCG/2ML IJ SOLN
INTRAMUSCULAR | Status: DC | PRN
  Administered 2016-12-22: 25 ug via INTRAVENOUS
  Administered 2016-12-22 (×2): 100 ug via INTRAVENOUS
  Administered 2016-12-22: 50 ug via INTRAVENOUS

## 2016-12-22 MED ORDER — MIDAZOLAM HCL 5 MG/5ML IJ SOLN
INTRAMUSCULAR | Status: DC | PRN
  Administered 2016-12-22: 2 mg via INTRAVENOUS

## 2016-12-22 MED ORDER — CEFAZOLIN SODIUM-DEXTROSE 2-4 GM/100ML-% IV SOLN
2.0000 g | Freq: Four times a day (QID) | INTRAVENOUS | Status: AC
  Administered 2016-12-22 – 2016-12-23 (×2): 2 g via INTRAVENOUS
  Filled 2016-12-22 (×2): qty 100

## 2016-12-22 MED ORDER — ACETAMINOPHEN 325 MG PO TABS
650.0000 mg | ORAL_TABLET | Freq: Four times a day (QID) | ORAL | Status: DC | PRN
Start: 2016-12-22 — End: 2016-12-25
  Administered 2016-12-23 – 2016-12-25 (×2): 650 mg via ORAL
  Filled 2016-12-22 (×2): qty 2

## 2016-12-22 MED ORDER — EPHEDRINE SULFATE 50 MG/ML IJ SOLN
INTRAMUSCULAR | Status: DC | PRN
  Administered 2016-12-22: 10 mg via INTRAVENOUS
  Administered 2016-12-22 (×2): 15 mg via INTRAVENOUS

## 2016-12-22 MED ORDER — PHENOL 1.4 % MT LIQD: 1.0000 | OROMUCOSAL | Status: DC | PRN

## 2016-12-22 MED ORDER — VANCOMYCIN HCL 500 MG IV SOLR
INTRAVENOUS | Status: AC
  Filled 2016-12-22: qty 500

## 2016-12-22 MED ORDER — TRANEXAMIC ACID 1000 MG/10ML IV SOLN
2000.0000 mg | INTRAVENOUS | Status: AC
  Filled 2016-12-22: qty 20

## 2016-12-22 MED ORDER — LIDOCAINE HCL (CARDIAC) 20 MG/ML IV SOLN
INTRAVENOUS | Status: DC | PRN
  Administered 2016-12-22: 40 mg via INTRAVENOUS

## 2016-12-22 MED ORDER — ENSURE ENLIVE PO LIQD
237.0000 mL | Freq: Two times a day (BID) | ORAL | Status: DC
  Administered 2016-12-23 – 2016-12-25 (×4): 237 mL via ORAL
  Filled 2016-12-22: qty 237

## 2016-12-22 MED ORDER — ONDANSETRON HCL 4 MG/2ML IJ SOLN
INTRAMUSCULAR | Status: DC | PRN
  Administered 2016-12-22: 4 mg via INTRAVENOUS

## 2016-12-22 MED ORDER — ACETAMINOPHEN 650 MG RE SUPP: 650.0000 mg | Freq: Four times a day (QID) | RECTAL | Status: DC | PRN

## 2016-12-22 MED ORDER — CEFAZOLIN SODIUM-DEXTROSE 2-3 GM-% IV SOLR
INTRAVENOUS | Status: DC | PRN
  Administered 2016-12-22: 2 g via INTRAVENOUS

## 2016-12-22 SURGICAL SUPPLY — 86 items
BAG DECANTER FOR FLEXI CONT (MISCELLANEOUS) ×2 IMPLANT
BANDAGE ACE 4X5 VEL STRL LF (GAUZE/BANDAGES/DRESSINGS) ×3 IMPLANT
BANDAGE ACE 6X5 VEL STRL LF (GAUZE/BANDAGES/DRESSINGS) ×1 IMPLANT
BANDAGE ESMARK 6X9 LF (GAUZE/BANDAGES/DRESSINGS) IMPLANT
BIT DRILL 4.3 (BIT) IMPLANT
BIT DRILL QC 3.3X195 (BIT) ×2 IMPLANT
BLADE CLIPPER SURG (BLADE) IMPLANT
BLADE SURG 15 STRL LF DISP TIS (BLADE) ×1 IMPLANT
BLADE SURG 15 STRL SS (BLADE)
BNDG CMPR 9X6 STRL LF SNTH (GAUZE/BANDAGES/DRESSINGS) ×1
BNDG CMPR MED 10X6 ELC LF (GAUZE/BANDAGES/DRESSINGS) ×1
BNDG COHESIVE 6X5 TAN STRL LF (GAUZE/BANDAGES/DRESSINGS) ×3 IMPLANT
BNDG ELASTIC 6X10 VLCR STRL LF (GAUZE/BANDAGES/DRESSINGS) ×2 IMPLANT
BNDG ESMARK 6X9 LF (GAUZE/BANDAGES/DRESSINGS) ×3
BNDG GAUZE ELAST 4 BULKY (GAUZE/BANDAGES/DRESSINGS) ×1 IMPLANT
CABLE CERLAGE W/CRIMP 1.8 (Cable) ×2 IMPLANT
CABLE CERLAGE W/CRIMP 1.8MM (Cable) ×2 IMPLANT
CANISTER SUCTION WELLS/JOHNSON (MISCELLANEOUS) ×2 IMPLANT
CAP LOCK NCB (Cap) ×20 IMPLANT
CEMENT CAL PHOSPHATE 5CC (Cement) ×2 IMPLANT
CLOSURE WOUND 1/2 X4 (GAUZE/BANDAGES/DRESSINGS) ×1
CONT SPEC 4OZ CLIKSEAL STRL BL (MISCELLANEOUS) ×2 IMPLANT
CUFF TOURNIQUET SINGLE 34IN LL (TOURNIQUET CUFF) IMPLANT
CUFF TOURNIQUET SINGLE 44IN (TOURNIQUET CUFF) IMPLANT
DRAPE C-ARM 42X72 X-RAY (DRAPES) ×3 IMPLANT
DRAPE C-ARMOR (DRAPES) ×2 IMPLANT
DRAPE HALF SHEET 40X57 (DRAPES) ×6 IMPLANT
DRAPE IMP U-DRAPE 54X76 (DRAPES) ×3 IMPLANT
DRAPE ORTHO SPLIT 77X108 STRL (DRAPES) ×6
DRAPE SURG ORHT 6 SPLT 77X108 (DRAPES) ×2 IMPLANT
DRAPE U-SHAPE 47X51 STRL (DRAPES) ×5 IMPLANT
DRILL BIT 4.3 (BIT) ×3
DRSG AQUACEL AG ADV 3.5X14 (GAUZE/BANDAGES/DRESSINGS) ×2 IMPLANT
DURAPREP 26ML APPLICATOR (WOUND CARE) ×5 IMPLANT
ELECT REM PT RETURN 9FT ADLT (ELECTROSURGICAL) ×3
ELECTRODE REM PT RTRN 9FT ADLT (ELECTROSURGICAL) ×1 IMPLANT
GAUZE SPONGE 4X4 12PLY STRL (GAUZE/BANDAGES/DRESSINGS) ×6 IMPLANT
GAUZE XEROFORM 1X8 LF (GAUZE/BANDAGES/DRESSINGS) ×1 IMPLANT
GLOVE BIOGEL PI IND STRL 8 (GLOVE) ×1 IMPLANT
GLOVE BIOGEL PI INDICATOR 8 (GLOVE) ×4
GLOVE SURG ORTHO 8.0 STRL STRW (GLOVE) ×7 IMPLANT
GLOVE XGUARD RR 2 7.5 (GLOVE) IMPLANT
GLOVE XGUARD RR2 7.5 (GLOVE) ×3
GOWN STRL REUS W/ TWL LRG LVL3 (GOWN DISPOSABLE) ×2 IMPLANT
GOWN STRL REUS W/ TWL XL LVL3 (GOWN DISPOSABLE) ×1 IMPLANT
GOWN STRL REUS W/TWL LRG LVL3 (GOWN DISPOSABLE) ×12
GOWN STRL REUS W/TWL XL LVL3 (GOWN DISPOSABLE) ×6
IMMOBILIZER KNEE 20 (SOFTGOODS) ×3
IMMOBILIZER KNEE 20 THIGH 36 (SOFTGOODS) IMPLANT
K-WIRE 2.0 (WIRE) ×12
K-WIRE FXSTD 280X2XNS SS (WIRE) ×4
KIT BASIN OR (CUSTOM PROCEDURE TRAY) ×3 IMPLANT
KIT ROOM TURNOVER OR (KITS) ×3 IMPLANT
KWIRE FXSTD 280X2XNS SS (WIRE) IMPLANT
LOCKPLATE CABLE BUTTON NCP HIP (Orthopedic Implant) ×4 IMPLANT
MANIFOLD NEPTUNE II (INSTRUMENTS) ×3 IMPLANT
NEEDLE 22X1 1/2 (OR ONLY) (NEEDLE) ×1 IMPLANT
NS IRRIG 1000ML POUR BTL (IV SOLUTION) ×7 IMPLANT
PACK GENERAL/GYN (CUSTOM PROCEDURE TRAY) ×3 IMPLANT
PACK UNIVERSAL I (CUSTOM PROCEDURE TRAY) ×3 IMPLANT
PAD ARMBOARD 7.5X6 YLW CONV (MISCELLANEOUS) ×6 IMPLANT
PAD CAST 4YDX4 CTTN HI CHSV (CAST SUPPLIES) IMPLANT
PADDING CAST COTTON 4X4 STRL (CAST SUPPLIES) ×3
PADDING CAST COTTON 6X4 STRL (CAST SUPPLIES) ×4 IMPLANT
PLATE PERIPROS RT 12H DIST FEM (Plate) ×2 IMPLANT
PUTTY DBM STAGRAFT PLUS 10CC (Putty) ×2 IMPLANT
SCREW 5.0 80MM (Screw) ×8 IMPLANT
SCREW CORTICAL 5.0X10MM (Screw) ×4 IMPLANT
SCREW NCB 4.0 32MM (Screw) ×2 IMPLANT
SCREW NCB 4.0MX30M (Screw) ×2 IMPLANT
SCREW NCB 4.0X40MM (Screw) ×2 IMPLANT
SCREW NCB 5.0X85MM (Screw) ×4 IMPLANT
SCREW UNI 5.0 12MM (Screw) ×4 IMPLANT
SPONGE LAP 18X18 X RAY DECT (DISPOSABLE) ×4 IMPLANT
STAPLER VISISTAT 35W (STAPLE) ×1 IMPLANT
STOCKINETTE IMPERVIOUS LG (DRAPES) ×3 IMPLANT
STRIP CLOSURE SKIN 1/2X4 (GAUZE/BANDAGES/DRESSINGS) ×1 IMPLANT
SUT ETHILON 3 0 PS 1 (SUTURE) ×2 IMPLANT
SUT MNCRL AB 3-0 PS2 18 (SUTURE) ×2 IMPLANT
SUT VIC AB 0 CTB1 27 (SUTURE) ×16 IMPLANT
SUT VIC AB 1 CT1 27 (SUTURE) ×6
SUT VIC AB 1 CT1 27XBRD ANBCTR (SUTURE) IMPLANT
SUT VIC AB 2-0 CTB1 (SUTURE) ×10 IMPLANT
SYR CONTROL 10ML LL (SYRINGE) ×1 IMPLANT
TOWEL OR 17X24 6PK STRL BLUE (TOWEL DISPOSABLE) ×3 IMPLANT
TOWEL OR 17X26 10 PK STRL BLUE (TOWEL DISPOSABLE) ×3 IMPLANT

## 2016-12-22 NOTE — Progress Notes (Signed)
Patient being transferred to [facility] for [facility], report given to OR, unit RN Suanne Marker [id number]. Patient was prepped for surgery except for CHG and EKG which was reported to RhondaRN.

## 2016-12-22 NOTE — Anesthesia Procedure Notes (Signed)
Procedure Name: Intubation Date/Time: 12/22/2016 5:05 PM Performed by: Ollen Bowl Pre-anesthesia Checklist: Patient identified, Emergency Drugs available, Suction available, Patient being monitored and Timeout performed Patient Re-evaluated:Patient Re-evaluated prior to induction Oxygen Delivery Method: Circle system utilized and Simple face mask Preoxygenation: Pre-oxygenation with 100% oxygen Induction Type: IV induction Ventilation: Mask ventilation without difficulty Laryngoscope Size: Miller and 2 Grade View: Grade I Tube type: Oral Tube size: 7.5 mm Number of attempts: 1 Airway Equipment and Method: Patient positioned with wedge pillow and Stylet Placement Confirmation: ETT inserted through vocal cords under direct vision,  positive ETCO2 and breath sounds checked- equal and bilateral Secured at: 21 cm Tube secured with: Tape Dental Injury: Teeth and Oropharynx as per pre-operative assessment

## 2016-12-22 NOTE — Progress Notes (Signed)
Anesthesia MD called.  Pt still very sleepy since surgery.  Pt will follow commands such as grips and wiggling her toes but does not respond at all verbally.  Will continue to monitor and notify for further changes.

## 2016-12-22 NOTE — Progress Notes (Signed)
After returning patient to her room she was still alert and conversing with her step daughter.  RN will request portable pulse o2 machine for patient.

## 2016-12-22 NOTE — Progress Notes (Signed)
Initial Nutrition Assessment  DOCUMENTATION CODES:   Underweight  INTERVENTION:  Once diet advances, provides Ensure Enlive po BID, each supplement provides 350 kcal and 20 grams of protein.  NUTRITION DIAGNOSIS:   Increased nutrient needs related to  (post op healing) as evidenced by estimated needs.  GOAL:   Patient will meet greater than or equal to 90% of their needs  MONITOR:   Labs, Weight trends, I & O's, Diet advancement, Supplement acceptance, Skin  REASON FOR ASSESSMENT:   Consult Assessment of nutrition requirement/status  ASSESSMENT:    76 y.o. female H/o osteoporosis, depression present with fall from home. CT right leg with Impacted transverse fracture of the metaphysis of the distal right femur  Pt currently in OR. RD unable to obtain most recent nutrition therapy. From MD note, pt not eating well since fall on Friday 7/13 as she has not been able to ambulate. RD to order Ensure once diet advances to aid in caloric and protein needs. Unable to complete Nutrition-Focused physical exam at this time. RD to perform physical exam at next visit.   Labs and medications reviewed. Potassium low at 3.3. Magnesium low at 1.6.  Diet Order:  Diet NPO time specified Except for: Sips with Meds, Ice Chips  Skin:  Reviewed, no issues  Last BM:  Unknown  Height:   Ht Readings from Last 1 Encounters:  12/21/16 5\' 3"  (1.6 m)    Weight:   Wt Readings from Last 1 Encounters:  12/21/16 103 lb (46.7 kg)    Ideal Body Weight:  52.27 kg  BMI:  Body mass index is 18.25 kg/m.  Estimated Nutritional Needs:   Kcal:  1500-1700  Protein:  65-75 grams  Fluid:  >/= 1.5 L/day  EDUCATION NEEDS:   No education needs identified at this time  Corrin Parker, MS, RD, LDN Pager # [phone] After hours/ weekend pager # [phone]

## 2016-12-22 NOTE — Anesthesia Preprocedure Evaluation (Addendum)
Anesthesia Evaluation  Patient identified by MRN, date of birth, ID band Patient awake    Reviewed: Allergy & Precautions, NPO status , Patient's Chart, lab work & pertinent test results  Airway Mallampati: II  TM Distance: >3 FB Neck ROM: Full    Dental no notable dental hx.    Pulmonary former smoker,    Pulmonary exam normal breath sounds clear to auscultation       Cardiovascular negative cardio ROS Normal cardiovascular exam Rhythm:Regular Rate:Normal  ECG: NSR, rate 66   Neuro/Psych Anxiety Depression negative neurological ROS     GI/Hepatic Neg liver ROS,   Endo/Other  negative endocrine ROS  Renal/GU negative Renal ROS  negative genitourinary   Musculoskeletal negative musculoskeletal ROS (+)   Abdominal   Peds negative pediatric ROS (+)  Hematology  (+) anemia ,   Anesthesia Other Findings Vertebral compression fracture and back pain tremors  Reproductive/Obstetrics negative OB ROS                            Anesthesia Physical Anesthesia Plan  ASA: II  Anesthesia Plan: General   Post-op Pain Management:    Induction:   PONV Risk Score and Plan: 3 and Ondansetron, Dexamethasone and Propofol  Airway Management Planned: Oral ETT  Additional Equipment:   Intra-op Plan:   Post-operative Plan: Extubation in OR  Informed Consent: I have reviewed the patients History and Physical, chart, labs and discussed the procedure including the risks, benefits and alternatives for the proposed anesthesia with the patient or authorized representative who has indicated his/her understanding and acceptance.   Dental advisory given  Plan Discussed with: CRNA  Anesthesia Plan Comments:       Anesthesia Quick Evaluation

## 2016-12-22 NOTE — Brief Op Note (Signed)
12/21/2016 - 12/22/2016  9:12 PM  PATIENT:  Jody Moore  76 y.o. female  PRE-OPERATIVE DIAGNOSIS:  RIGHT DISTAL FEMUR FRACTURE  POST-OPERATIVE DIAGNOSIS:  RIGHT DISTAL FEMUR FRACTURE  PROCEDURE:  Procedure(s): OPEN REDUCTION INTERNAL FIXATION (ORIF) DISTAL FEMUR FRACTURE  SURGEON:  Surgeon(s): [name], MD  ASSISTANT: Laure Kidney rnfa  ANESTHESIA:   general  EBL: 175 ml    Total I/O In: 1900 [I.V.:1900] Out: 350 [Urine:250; Blood:100]  BLOOD ADMINISTERED:1 unit  DRAINS: none   LOCAL MEDICATIONS USED:  none  SPECIMEN:  No Specimen  COUNTS:  YES  TOURNIQUET:    DICTATION: .Other Dictation: Dictation Number [id number]  PLAN OF CARE: Admit to inpatient   PATIENT DISPOSITION:  PACU - hemodynamically stable

## 2016-12-22 NOTE — Progress Notes (Signed)
MD again at bedside and after 4 doses of .04 Narcan pt is much more easily aroused, has her head off the pillow recited the pledge of allegiance and will be taken to her room.

## 2016-12-22 NOTE — Transfer of Care (Signed)
Immediate Anesthesia Transfer of Care Note  Patient: Jody Moore  Procedure(s) Performed: Procedure(s): OPEN REDUCTION INTERNAL FIXATION (ORIF) DISTAL FEMUR FRACTURE (Right)  Patient Location: PACU  Anesthesia Type:General  Level of Consciousness: drowsy and responds to stimulation  Airway & Oxygen Therapy: Patient Spontanous Breathing and Patient connected to nasal cannula oxygen  Post-op Assessment: Report given to RN and Post -op Vital signs reviewed and stable  Post vital signs: Reviewed and stable  Last Vitals:  Vitals:   12/22/16 0555 12/22/16 2117  BP: (!) 108/57   Pulse: (!) 101   Resp: 17   Temp: 36.8 C (!) 36.3 C    Last Pain:  Vitals:   12/22/16 1415  TempSrc:   PainSc: 3       Patients Stated Pain Goal: 3 (59/74/71 8550)  Complications: No apparent anesthesia complications

## 2016-12-22 NOTE — Progress Notes (Signed)
PROGRESS NOTE  Jody Moore [facility]:[id number] DOB: 1941-04-09 DOA: 12/21/2016 PCP: Gildardo Cranker, DO  HPI/Recap of past 24 hours:  Right leg remain swollen, she required prn pain meds, no fever, denies back pain Family in room  Assessment/Plan: Active Problems:   Femur fracture, right ([address])   Accidental Fall/right femur fracture: -CT right leg: Impacted transverse fracture of the metaphysis of the distal right femur. -on exam right leg externally rotated, edematous , with decreased sensation.  -patient is to transferred to [place] to have surgery by Dr Marlou Sa.  keep npo,  On prn analgesics, DVT prophylaxis per ortho   Right ankle pain, right leg /foot edema: right ankle xray no acute findings Right lower extremity venous doppler pending Pain control.  Vertebral compression fracture and back pain: -She report low back pain is progressive, with band like sensation, her back pain has been aggravating recently.  -On exam she does has mild point tenderness mid low back, she denies bowel and bladder incontinence.  - MRI thoracic + Acute compression fracture involving the superior endplate of [id number] associated 50% height loss with trace 2 mm bony retropulsion. No significant stenosis.  -lumber spine chronic findings  -case discussed with IR Dr Barbie Banner who recommended outpatient follow up with IR in 3-4 weeks.    Leukocytosis: likely from dehydration and stress, no sign of infection Wbc normalized with Ivf  Hypokalemia/hypomagnesemia: replace k/ mag  Normocytic anemia:  hgb 10-9 Possible acute on chronic from some blood loss from fracture.  Repeat cbc in am, no indication for blood transfusion as of now. Anemia work up.  Mildly Elevated AST:  -She does report alcohol use, report drinks wine about three times a week, she has not drink any since the fall on Friday. -she dose has mildly elevated ck, so muscle injury from the fall could cause elevated ast - hepatitis  panel pending   Dehydration/starvation ketosis (+ ketone in urine) -hydration, resume regular diet after surgery  Malnutrition:  Body mass index is 18.25 kg/m.  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  Social worker consulted, she agreed to SNF placement if needed at discharge.  DVT prophylaxis: hold lovenox for  surgical intervention, foot pump for now  Consultants: orthopedics, interventional radiology   Code Status: full   Family Communication:  Patient and family at bedside  Disposition Plan: transfer to [place] for surgery    Procedures:  ORIF for distal femur fracture planned on 7/17 pm  Antibiotics:  Expect Perioperative  abx   Objective: BP (!) 108/57 (BP Location: Right Arm)   Pulse (!) 101   Temp 98.3 F (36.8 C) (Oral)   Resp 17   Ht 5\' 3"  (1.6 m)   Wt 46.7 kg (103 lb)   SpO2 94%   BMI 18.25 kg/m   Intake/Output Summary (Last 24 hours) at 12/22/16 1055 Last data filed at 12/22/16 0850  Gross per 24 hour  Intake            862.5 ml  Output              350 ml  Net            512.5 ml   Filed Weights   12/21/16 1124  Weight: 46.7 kg (103 lb)    Exam:   General:  Frail, thin, malnourish elderly female , NAD  Cardiovascular: RRR, + murmur best heard at  mitral valve region ( report chronic)  Respiratory: CTABL  Abdomen: Soft/ND/NT, positive BS  Musculoskeletal: Right leg shortened, externally rotated, right lower leg and foot significant edematous, decreased sensation right foot, palpable pedal pulse. Point tenderness mid/low back  Neuro: aaox3  Data Reviewed: Basic Metabolic Panel:  Recent Labs Lab 12/21/16 1232 12/21/16 1854 12/22/16 0546  NA 138  --  139  K 3.4*  --  3.3*  CL 105  --  107  CO2 22  --  23  GLUCOSE 109*  --  101*  BUN 20  --  16  CREATININE 0.71 0.69 0.59  CALCIUM 8.6*  --  8.5*  MG  --   --  1.6*    Liver Function Tests:  Recent Labs Lab 12/21/16 1232 12/22/16 0546  AST 71* 55*  ALT 30 28  ALKPHOS 73 60  BILITOT 0.8 1.0  PROT 6.6 5.6*  ALBUMIN 3.2* 2.6*   No results for input(s): LIPASE, AMYLASE in the last 168 hours. No results for input(s): AMMONIA in the last 168 hours. CBC:  Recent Labs Lab 12/21/16 1232 12/21/16 1854 12/22/16 0546  WBC 12.5* 10.8* 6.5  NEUTROABS  --   --  3.7  HGB 10.2* 9.7* 9.0*  HCT 30.7* 29.4* 27.1*  MCV 94.5 95.1 93.1  PLT 327 319 291   Cardiac Enzymes:    Recent Labs Lab 12/22/16 0546  CKTOTAL 520*   BNP (last 3 results) No results for input(s): BNP in the last 8760 hours.  ProBNP (last 3 results) No results for input(s): PROBNP in the last 8760 hours.  CBG: No results for input(s): GLUCAP in the last 168 hours.  No results found for this or any previous visit (from the past 240 hour(s)).   Studies: Dg Chest 2 View  Result Date: 12/21/2016 CLINICAL DATA:  [place] 3 days ago.  No reported chest symptoms. EXAM: CHEST  2 VIEW COMPARISON:  08/13/2005. FINDINGS: Normal sized heart. Mild linear density at both lung bases. Approximately 50% lower thoracic vertebral compression deformity. No visible acute fracture lines. No previous lateral view for comparison. IMPRESSION: 1. Approximately 50% lower thoracic vertebral compression deformity, age indeterminate. 2. Mild bibasilar linear atelectasis or scarring. Electronically Signed   By: Claudie Revering M.D.   On: 12/21/2016 13:26   Dg Ankle 2 Views Right  Result Date: 12/21/2016 CLINICAL DATA:  Remote right ankle fracture.  Ankle now on stable. EXAM: RIGHT ANKLE - 2 VIEW COMPARISON:  None. FINDINGS: Two views study shows plate and screw fixation of the distal tibia with persistent apex posterior angulation. Degenerative changes are noted at the tibiotalar joint. Relationship of the distal fibula to the ankle mortise not well demonstrated on this study. Bones are diffusely demineralized.  IMPRESSION: 1. Posttraumatic deformity of the distal tibia with evidence of ORIF. 2. Osteoarthritis of the tibiotalar joint. Electronically Signed   By: Misty Stanley M.D.   On: 12/21/2016 18:54   Ct Head Wo Contrast  Result Date: 12/21/2016 CLINICAL DATA:  Fall 3 days ago EXAM: CT HEAD WITHOUT CONTRAST CT CERVICAL SPINE WITHOUT CONTRAST TECHNIQUE: Multidetector CT imaging of the head and cervical spine was performed following the standard protocol without intravenous contrast. Multiplanar CT image reconstructions of the cervical spine were also generated. COMPARISON:  None. FINDINGS: CT HEAD FINDINGS Brain: There are mild chronic ischemic changes in the periventricular white matter. No mass effect, midline shift, or acute hemorrhage. Mild global atrophy appropriate to age. Vascular: No hyperdense vessel or unexpected calcification. Skull:  Cranium is intact. Sinuses/Orbits: Visualized paranasal sinuses and mastoid air cells are clear. Other: Noncontributory CT CERVICAL SPINE FINDINGS Alignment: There is anterolisthesis C4 upon C5 which is related to facet arthropathy. There is reversed cervical lordosis. Skull base and vertebrae: No acute fracture.  No dislocation. Soft tissues and spinal canal: No evidence of spinal hematoma or soft tissue injury. Disc levels: There is severe narrowing of the C5-6 and C6-7 discs with posterior osteophytic ridging. Spinal canal is grossly patent. Upper chest: Emphysema.  No lung mass. Other: Thyroid is unremarkable. IMPRESSION: No acute intracranial pathology. No evidence of cervical spine injury. Chronic changes. Electronically Signed   By: Marybelle Killings M.D.   On: 12/21/2016 13:39   Ct Cervical Spine Wo Contrast  Result Date: 12/21/2016 CLINICAL DATA:  Fall 3 days ago EXAM: CT HEAD WITHOUT CONTRAST CT CERVICAL SPINE WITHOUT CONTRAST TECHNIQUE: Multidetector CT imaging of the head and cervical spine was performed following the standard protocol without intravenous contrast.  Multiplanar CT image reconstructions of the cervical spine were also generated. COMPARISON:  None. FINDINGS: CT HEAD FINDINGS Brain: There are mild chronic ischemic changes in the periventricular white matter. No mass effect, midline shift, or acute hemorrhage. Mild global atrophy appropriate to age. Vascular: No hyperdense vessel or unexpected calcification. Skull: Cranium is intact. Sinuses/Orbits: Visualized paranasal sinuses and mastoid air cells are clear. Other: Noncontributory CT CERVICAL SPINE FINDINGS Alignment: There is anterolisthesis C4 upon C5 which is related to facet arthropathy. There is reversed cervical lordosis. Skull base and vertebrae: No acute fracture.  No dislocation. Soft tissues and spinal canal: No evidence of spinal hematoma or soft tissue injury. Disc levels: There is severe narrowing of the C5-6 and C6-7 discs with posterior osteophytic ridging. Spinal canal is grossly patent. Upper chest: Emphysema.  No lung mass. Other: Thyroid is unremarkable. IMPRESSION: No acute intracranial pathology. No evidence of cervical spine injury. Chronic changes. Electronically Signed   By: Marybelle Killings M.D.   On: 12/21/2016 13:39   Ct Knee Right Wo Contrast  Result Date: 12/21/2016 CLINICAL DATA:  Acute distal femur fracture. EXAM: CT OF THE RIGHT KNEE WITHOUT CONTRAST TECHNIQUE: Multidetector CT imaging of the right knee was performed according to the standard protocol. Multiplanar CT image reconstructions were also generated. COMPARISON:  Radiographs dated 12/21/2016 FINDINGS: Bones/Joint/Cartilage There is an old healed fracture of the distal right femoral shaft with an intramedullary nail in place. There is an old fracture of the lateral femoral condyle with 2 screws in place in the lateral femoral condyle. There is an acute impacted transverse fracture of the metaphysis of the distal right femur. There are no acute vertical fractures through the femoral condyles or intertrochanteric notch.  IMPRESSION: Impacted transverse fracture of the metaphysis of the distal right femur. Electronically Signed   By: Lorriane Shire M.D.   On: 12/21/2016 16:26   Mr Thoracic Spine Wo Contrast  Result Date: 12/21/2016 CLINICAL DATA:  Initial evaluation for acute low back pain, history of osteoporosis, suspected compression fracture. EXAM: MRI THORACIC AND LUMBAR SPINE WITHOUT CONTRAST TECHNIQUE: Multiplanar and multiecho pulse sequences of the thoracic and lumbar spine were obtained without intravenous contrast. COMPARISON:  None. FINDINGS: MRI THORACIC SPINE FINDINGS Alignment: Mild exaggeration of the normal thoracic kyphosis. No listhesis or subluxation. Vertebrae: There is abnormal linear T1 hypointense signal intensity with associated T2/STIR hyperintensity involving the T11 vertebral body, consistent with acute compression fracture. Associated height loss of up to 50% the with trace 2 mm bony retropulsion. This fracture is  benign/ osteoporotic in appearance. Vertebral body heights are otherwise maintained. No other evidence for acute or chronic fracture. Signal intensity within the vertebral body bone marrow within normal limits. No worrisome osseous lesions. Few small benign hemangiomas noted. Cord:  Signal intensity within the thoracic spinal cord is normal. Paraspinal and other soft tissues: Paraspinous soft tissues within normal limits. Visualized visceral structures grossly unremarkable. Atelectatic changes present within the posterior right lung base. Disc levels: No significant degenerative changes seen within the thoracic spine. No significant disc bulge or focal disc protrusion. No canal or foraminal stenosis. MRI LUMBAR SPINE FINDINGS Segmentation: Normal segmentation. Lowest well-formed disc is labeled the L5-S1 level. Alignment: Mild exaggeration of the normal thoracic kyphosis. Trace retrolisthesis of L1 on L2 and L2 on L3. Vertebral bodies otherwise normally aligned. Vertebrae: Acute compression  fracture involving the T11 vertebral body, described on thoracic spine portion of this report. Vertebral body heights are otherwise maintained. No other evidence for acute or chronic fracture within the lumbar spine. Signal intensity within the vertebral body bone marrow within normal limits. No discrete or worrisome osseous lesions. Conus medullaris: Extends to the L1 level and appears normal. Paraspinal and other soft tissues: Paraspinous soft tissues within normal limits. Left kidney atrophic with scattered cysts. Infrarenal aorta ectatic measuring up to 2.6 cm. Disc levels: L1-2: Trace retrolisthesis of L1 on L2. Mild diffuse disc bulge with bilateral facet hypertrophy. No significant canal or foraminal stenosis. L2-3: Trace retrolisthesis of L2 on L3. Diffuse circumferential disc bulge. Moderate facet hypertrophy. Resultant mild to moderate canal with bilateral lateral recess stenosis, slightly greater on the left. No significant foraminal encroachment. L3-4: Minimal disc bulge. Moderate bilateral facet hypertrophy. Resultant mild canal and bilateral lateral recess stenosis. No significant foraminal encroachment. L4-5: Minimal annular disc bulge. Moderate bilateral facet arthrosis. Resultant mild to moderate left lateral recess stenosis. No significant canal narrowing. Foramina are widely patent. L5-S1: Shallow posterior disc bulge. Moderate bilateral facet arthropathy. No significant stenosis. IMPRESSION: MR THORACIC SPINE IMPRESSION 1. Acute compression fracture involving the superior endplate of M01 with associated 50% height loss with trace 2 mm bony retropulsion. No significant stenosis. 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 2. No other acute traumatic injury within the thoracic spine. MR LUMBAR SPINE IMPRESSION 1. No acute compression fracture or other abnormality within the lumbar spine. 2. Multilevel degenerative spondylolysis as above with resultant mild to moderate canal and bilateral lateral  recess stenosis, most prominent at L2-3. 3. Moderate facet arthropathy at L4-5 and L5-S1, which could serve as a source for back pain. 4. **An incidental finding of potential clinical significance has been found. 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 Recommend followup by ultrasound in 5 years. This recommendation follows ACR consensus guidelines: White Paper of the ACR Incidental Findings Committee II on Vascular Findings. J Am Coll Radiol 2013; 10:789-794.** Electronically Signed   By: Jeannine Boga M.D.   On: 12/21/2016 21:28   Mr Lumbar Spine Wo Contrast  Result Date: 12/21/2016 CLINICAL DATA:  Initial evaluation for acute low back pain, history of osteoporosis, suspected compression fracture. EXAM: MRI THORACIC AND LUMBAR SPINE WITHOUT CONTRAST TECHNIQUE: Multiplanar and multiecho pulse sequences of the thoracic and lumbar spine were obtained without intravenous contrast. COMPARISON:  None. FINDINGS: MRI THORACIC SPINE FINDINGS Alignment: Mild exaggeration of the normal thoracic kyphosis. No listhesis or subluxation. Vertebrae: There is abnormal linear T1 hypointense signal intensity with associated T2/STIR hyperintensity involving the T11 vertebral body, consistent with acute compression fracture.  Associated height loss of up to 50% the with trace 2 mm bony retropulsion. This fracture is benign/ osteoporotic in appearance. Vertebral body heights are otherwise maintained. No other evidence for acute or chronic fracture. Signal intensity within the vertebral body bone marrow within normal limits. No worrisome osseous lesions. Few small benign hemangiomas noted. Cord:  Signal intensity within the thoracic spinal cord is normal. Paraspinal and other soft tissues: Paraspinous soft tissues within normal limits. Visualized visceral structures grossly unremarkable. Atelectatic changes present within the posterior right lung base. Disc levels: No significant  degenerative changes seen within the thoracic spine. No significant disc bulge or focal disc protrusion. No canal or foraminal stenosis. MRI LUMBAR SPINE FINDINGS Segmentation: Normal segmentation. Lowest well-formed disc is labeled the L5-S1 level. Alignment: Mild exaggeration of the normal thoracic kyphosis. Trace retrolisthesis of L1 on L2 and L2 on L3. Vertebral bodies otherwise normally aligned. Vertebrae: Acute compression fracture involving the T11 vertebral body, described on thoracic spine portion of this report. Vertebral body heights are otherwise maintained. No other evidence for acute or chronic fracture within the lumbar spine. Signal intensity within the vertebral body bone marrow within normal limits. No discrete or worrisome osseous lesions. Conus medullaris: Extends to the L1 level and appears normal. Paraspinal and other soft tissues: Paraspinous soft tissues within normal limits. Left kidney atrophic with scattered cysts. Infrarenal aorta ectatic measuring up to 2.6 cm. Disc levels: L1-2: Trace retrolisthesis of L1 on L2. Mild diffuse disc bulge with bilateral facet hypertrophy. No significant canal or foraminal stenosis. L2-3: Trace retrolisthesis of L2 on L3. Diffuse circumferential disc bulge. Moderate facet hypertrophy. Resultant mild to moderate canal with bilateral lateral recess stenosis, slightly greater on the left. No significant foraminal encroachment. L3-4: Minimal disc bulge. Moderate bilateral facet hypertrophy. Resultant mild canal and bilateral lateral recess stenosis. No significant foraminal encroachment. L4-5: Minimal annular disc bulge. Moderate bilateral facet arthrosis. Resultant mild to moderate left lateral recess stenosis. No significant canal narrowing. Foramina are widely patent. L5-S1: Shallow posterior disc bulge. Moderate bilateral facet arthropathy. No significant stenosis. IMPRESSION: MR THORACIC SPINE IMPRESSION 1. Acute compression fracture involving the superior  endplate of H88 with associated 50% height loss with trace 2 mm bony retropulsion. No significant stenosis. 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 2. No other acute traumatic injury within the thoracic spine. MR LUMBAR SPINE IMPRESSION 1. No acute compression fracture or other abnormality within the lumbar spine. 2. 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 3. Moderate facet arthropathy at L4-5 and L5-S1, which could serve as a source for back pain. 4. **An incidental finding of potential clinical significance has been found. 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 Recommend followup by ultrasound in 5 years. This recommendation follows ACR consensus guidelines: White Paper of the ACR Incidental Findings Committee II on Vascular Findings. J Am Coll Radiol 2013; 10:789-794.** Electronically Signed   By: Jeannine Boga M.D.   On: 12/21/2016 21:28   Dg Hip Unilat With Pelvis 2-3 Views Right  Result Date: 12/21/2016 CLINICAL DATA:  Right leg pain following a fall 3 days ago. EXAM: DG HIP (WITH OR WITHOUT PELVIS) 2-3V RIGHT COMPARISON:  Right femur radiographs obtained at the same obtained. FINDINGS: Intramedullary rod and screw fixation of the proximal femur. Old, healed proximal femur fracture, laterally. No acute fracture or dislocation. Diffuse osteopenia. IMPRESSION: No right hip fracture or dislocation. Electronically Signed  By: Claudie Revering M.D.   On: 12/21/2016 13:30   Dg Femur Min 2 Views Right  Result Date: 12/21/2016 CLINICAL DATA:  Right leg pain following a fall 3 days ago. EXAM: RIGHT FEMUR 2 VIEWS COMPARISON:  None. FINDINGS: Intramedullary variety extending the length of the femur with proximal and distal fixation screws. Mildly comminuted fracture of the distal femoral metaphysis with possible extension into the joint space. Posterior  displacement and angulation of the distal fragments. Old, healed proximal femur fracture. Diffuse osteopenia. Moderate-sized effusion. Atheromatous arterial calcifications. IMPRESSION: Mildly comminuted fracture of the distal femoral metaphysis with possible extension into the joint space with an associated moderate-sized effusion. Electronically Signed   By: Claudie Revering M.D.   On: 12/21/2016 13:29    Scheduled Meds: . [START ON 12/23/2016] enoxaparin (LOVENOX) injection  40 mg Subcutaneous Q24H  . PARoxetine  40 mg Oral Daily  . polyethylene glycol  17 g Oral Daily  . potassium chloride  40 mEq Oral Q4H  . senna-docusate  1 tablet Oral BID    Continuous Infusions: . sodium chloride 75 mL/hr at 12/21/16 2142     Time spent: 1mins  Jazzlynn Rawe MD, PhD  [facility] Pager [phone]. If 7PM-7AM, please contact night-coverage at [email or web address], password [facility] 12/22/2016, 10:55 AM  LOS: 1 day

## 2016-12-22 NOTE — Consult Note (Signed)
Reason for Consult: Right knee pain Referring Physician: Dr [name] is an 76 y.o. female.  HPI: Jody Moore is an ambulatory 76 year old female who sustained a mechanical fall without syncope yesterday.  Patient has a history of right femur fracture treated over 10 years ago.  She takes care of her husband who has progressive dementia.  She stopped smoking about a year ago.  She denies any other orthopedic complaints.  Workup to date demonstrates acute compression fracture without spinal stenosis or nerve compression in the thoracic or lumbar spine.  She is here with her family.  Past Medical History:  Diagnosis Date  . Cervical cancer ([place])   . Depression   . Osteoporosis   . Reflux     Past Surgical History:  Procedure Laterality Date  . ANKLE SURGERY    . CATARACT EXTRACTION    . [place]   cancer-radiation   . FEMUR SURGERY  2005   fractured right femur   . HIP SURGERY  2006   fractured right hip   . TONSILLECTOMY      Family History  Problem Relation Age of Onset  . Heart disease Father     Social History:  reports that she quit smoking about 3 years ago. Her smoking use included Cigarettes. She smoked 2.00 packs per day. She has never used smokeless tobacco. She reports that she drinks alcohol. She reports that she does not use drugs.  Allergies:  Allergies  Allergen Reactions  . Influenza Vaccines Hives    Hives/swelling/injection site war to the touch. No relief with benadryl   . Omeprazole Hives  . Coumadin [Warfarin Sodium] Rash    Medications: I have reviewed the patient's current medications.  Results for orders placed or performed during the hospital encounter of 12/21/16 (from the past 48 hour(s))  Comprehensive metabolic panel     Status: Abnormal   Collection Time: 12/21/16 12:32 PM  Result Value Ref Range   Sodium 138 135 - 145 mmol/L   Potassium 3.4 (L) 3.5 - 5.1 mmol/L   Chloride 105 101 - 111 mmol/L   CO2 22 22 - 32 mmol/L    Glucose, Bld 109 (H) 65 - 99 mg/dL   BUN 20 6 - 20 mg/dL   Creatinine, Ser 0.71 0.44 - 1.00 mg/dL   Calcium 8.6 (L) 8.9 - 10.3 mg/dL   Total Protein 6.6 6.5 - 8.1 g/dL   Albumin 3.2 (L) 3.5 - 5.0 g/dL   AST 71 (H) 15 - 41 U/L   ALT 30 14 - 54 U/L   Alkaline Phosphatase 73 38 - 126 U/L   Total Bilirubin 0.8 0.3 - 1.2 mg/dL   GFR calc non Af Amer >60 >60 mL/min   GFR calc Af Amer >60 >60 mL/min    Comment: (NOTE) The eGFR has been calculated using the CKD EPI equation. This calculation has not been validated in all clinical situations. eGFR's persistently <60 mL/min signify possible Chronic Kidney Disease.    Anion gap 11 5 - 15  CBC     Status: Abnormal   Collection Time: 12/21/16 12:32 PM  Result Value Ref Range   WBC 12.5 (H) 4.0 - 10.5 K/uL   RBC 3.25 (L) 3.87 - 5.11 MIL/uL   Hemoglobin 10.2 (L) 12.0 - 15.0 g/dL   HCT 30.7 (L) 36.0 - 46.0 %   MCV 94.5 78.0 - 100.0 fL   MCH 31.4 26.0 - 34.0 pg   MCHC 33.2 30.0 -  36.0 g/dL   RDW 13.6 11.5 - 15.5 %   Platelets 327 150 - 400 K/uL  Urinalysis, Routine w reflex microscopic     Status: Abnormal   Collection Time: 12/21/16  2:14 PM  Result Value Ref Range   Color, Urine YELLOW YELLOW   APPearance HAZY (A) CLEAR   Specific Gravity, Urine 1.021 1.005 - 1.030   pH 5.0 5.0 - 8.0   Glucose, UA NEGATIVE NEGATIVE mg/dL   Hgb urine dipstick MODERATE (A) NEGATIVE   Bilirubin Urine NEGATIVE NEGATIVE   Ketones, ur 80 (A) NEGATIVE mg/dL   Protein, ur 30 (A) NEGATIVE mg/dL   Nitrite NEGATIVE NEGATIVE   Leukocytes, UA NEGATIVE NEGATIVE   RBC / HPF 6-30 0 - 5 RBC/hpf   WBC, UA 0-5 0 - 5 WBC/hpf   Bacteria, UA RARE (A) NONE SEEN   Squamous Epithelial / LPF 0-5 (A) NONE SEEN   Mucous PRESENT    Hyaline Casts, UA PRESENT   Ethanol     Status: None   Collection Time: 12/21/16  6:54 PM  Result Value Ref Range   Alcohol, Ethyl (B) <5 <5 mg/dL    Comment:        LOWEST DETECTABLE LIMIT FOR SERUM ALCOHOL IS 5 mg/dL FOR MEDICAL PURPOSES  ONLY   CBC     Status: Abnormal   Collection Time: 12/21/16  6:54 PM  Result Value Ref Range   WBC 10.8 (H) 4.0 - 10.5 K/uL   RBC 3.09 (L) 3.87 - 5.11 MIL/uL   Hemoglobin 9.7 (L) 12.0 - 15.0 g/dL   HCT 29.4 (L) 36.0 - 46.0 %   MCV 95.1 78.0 - 100.0 fL   MCH 31.4 26.0 - 34.0 pg   MCHC 33.0 30.0 - 36.0 g/dL   RDW 13.8 11.5 - 15.5 %   Platelets 319 150 - 400 K/uL  Creatinine, serum     Status: None   Collection Time: 12/21/16  6:54 PM  Result Value Ref Range   Creatinine, Ser 0.69 0.44 - 1.00 mg/dL   GFR calc non Af Amer >60 >60 mL/min   GFR calc Af Amer >60 >60 mL/min    Comment: (NOTE) The eGFR has been calculated using the CKD EPI equation. This calculation has not been validated in all clinical situations. eGFR's persistently <60 mL/min signify possible Chronic Kidney Disease.   CBC with Differential/Platelet     Status: Abnormal   Collection Time: 12/22/16  5:46 AM  Result Value Ref Range   WBC 6.5 4.0 - 10.5 K/uL   RBC 2.91 (L) 3.87 - 5.11 MIL/uL   Hemoglobin 9.0 (L) 12.0 - 15.0 g/dL   HCT 27.1 (L) 36.0 - 46.0 %   MCV 93.1 78.0 - 100.0 fL   MCH 30.9 26.0 - 34.0 pg   MCHC 33.2 30.0 - 36.0 g/dL   RDW 13.7 11.5 - 15.5 %   Platelets 291 150 - 400 K/uL   Neutrophils Relative % 57 %   Neutro Abs 3.7 1.7 - 7.7 K/uL   Lymphocytes Relative 25 %   Lymphs Abs 1.6 0.7 - 4.0 K/uL   Monocytes Relative 13 %   Monocytes Absolute 0.8 0.1 - 1.0 K/uL   Eosinophils Relative 4 %   Eosinophils Absolute 0.2 0.0 - 0.7 K/uL   Basophils Relative 1 %   Basophils Absolute 0.0 0.0 - 0.1 K/uL  Comprehensive metabolic panel     Status: Abnormal   Collection Time: 12/22/16  5:46 AM  Result Value  Ref Range   Sodium 139 135 - 145 mmol/L   Potassium 3.3 (L) 3.5 - 5.1 mmol/L   Chloride 107 101 - 111 mmol/L   CO2 23 22 - 32 mmol/L   Glucose, Bld 101 (H) 65 - 99 mg/dL   BUN 16 6 - 20 mg/dL   Creatinine, Ser 0.59 0.44 - 1.00 mg/dL   Calcium 8.5 (L) 8.9 - 10.3 mg/dL   Total Protein 5.6 (L) 6.5  - 8.1 g/dL   Albumin 2.6 (L) 3.5 - 5.0 g/dL   AST 55 (H) 15 - 41 U/L   ALT 28 14 - 54 U/L   Alkaline Phosphatase 60 38 - 126 U/L   Total Bilirubin 1.0 0.3 - 1.2 mg/dL   GFR calc non Af Amer >60 >60 mL/min   GFR calc Af Amer >60 >60 mL/min    Comment: (NOTE) The eGFR has been calculated using the CKD EPI equation. This calculation has not been validated in all clinical situations. eGFR's persistently <60 mL/min signify possible Chronic Kidney Disease.    Anion gap 9 5 - 15  Magnesium     Status: Abnormal   Collection Time: 12/22/16  5:46 AM  Result Value Ref Range   Magnesium 1.6 (L) 1.7 - 2.4 mg/dL  Protime-INR     Status: Abnormal   Collection Time: 12/22/16  5:46 AM  Result Value Ref Range   Prothrombin Time 15.3 (H) 11.4 - 15.2 seconds   INR 1.20   CK     Status: Abnormal   Collection Time: 12/22/16  5:46 AM  Result Value Ref Range   Total CK 520 (H) 38 - 234 U/L  Surgical pcr screen     Status: None   Collection Time: 12/22/16 11:15 AM  Result Value Ref Range   MRSA, PCR NEGATIVE NEGATIVE   Staphylococcus aureus NEGATIVE NEGATIVE    Comment:        The Xpert SA Assay (FDA approved for NASAL specimens in patients over 51 years of age), is one component of a comprehensive surveillance program.  Test performance has been validated by [facility] for patients greater than or equal to 64 year old. It is not intended to diagnose infection nor to guide or monitor treatment.     Dg Chest 2 View  Result Date: 12/21/2016 CLINICAL DATA:  [place] 3 days ago.  No reported chest symptoms. EXAM: CHEST  2 VIEW COMPARISON:  08/13/2005. FINDINGS: Normal sized heart. Mild linear density at both lung bases. Approximately 50% lower thoracic vertebral compression deformity. No visible acute fracture lines. No previous lateral view for comparison. IMPRESSION: 1. Approximately 50% lower thoracic vertebral compression deformity, age indeterminate. 2. Mild bibasilar linear atelectasis or  scarring. Electronically Signed   By: Claudie Revering M.D.   On: 12/21/2016 13:26   Dg Ankle 2 Views Right  Result Date: 12/21/2016 CLINICAL DATA:  Remote right ankle fracture.  Ankle now on stable. EXAM: RIGHT ANKLE - 2 VIEW COMPARISON:  None. FINDINGS: Two views study shows plate and screw fixation of the distal tibia with persistent apex posterior angulation. Degenerative changes are noted at the tibiotalar joint. Relationship of the distal fibula to the ankle mortise not well demonstrated on this study. Bones are diffusely demineralized. IMPRESSION: 1. Posttraumatic deformity of the distal tibia with evidence of ORIF. 2. Osteoarthritis of the tibiotalar joint. Electronically Signed   By: Misty Stanley M.D.   On: 12/21/2016 18:54   Ct Head Wo Contrast  Result Date: 12/21/2016 CLINICAL  DATA:  Fall 3 days ago EXAM: CT HEAD WITHOUT CONTRAST CT CERVICAL SPINE WITHOUT CONTRAST TECHNIQUE: Multidetector CT imaging of the head and cervical spine was performed following the standard protocol without intravenous contrast. Multiplanar CT image reconstructions of the cervical spine were also generated. COMPARISON:  None. FINDINGS: CT HEAD FINDINGS Brain: There are mild chronic ischemic changes in the periventricular white matter. No mass effect, midline shift, or acute hemorrhage. Mild global atrophy appropriate to age. Vascular: No hyperdense vessel or unexpected calcification. Skull: Cranium is intact. Sinuses/Orbits: Visualized paranasal sinuses and mastoid air cells are clear. Other: Noncontributory CT CERVICAL SPINE FINDINGS Alignment: There is anterolisthesis C4 upon C5 which is related to facet arthropathy. There is reversed cervical lordosis. Skull base and vertebrae: No acute fracture.  No dislocation. Soft tissues and spinal canal: No evidence of spinal hematoma or soft tissue injury. Disc levels: There is severe narrowing of the C5-6 and C6-7 discs with posterior osteophytic ridging. Spinal canal is grossly  patent. Upper chest: Emphysema.  No lung mass. Other: Thyroid is unremarkable. IMPRESSION: No acute intracranial pathology. No evidence of cervical spine injury. Chronic changes. Electronically Signed   By: Marybelle Killings M.D.   On: 12/21/2016 13:39   Ct Cervical Spine Wo Contrast  Result Date: 12/21/2016 CLINICAL DATA:  Fall 3 days ago EXAM: CT HEAD WITHOUT CONTRAST CT CERVICAL SPINE WITHOUT CONTRAST TECHNIQUE: Multidetector CT imaging of the head and cervical spine was performed following the standard protocol without intravenous contrast. Multiplanar CT image reconstructions of the cervical spine were also generated. COMPARISON:  None. FINDINGS: CT HEAD FINDINGS Brain: There are mild chronic ischemic changes in the periventricular white matter. No mass effect, midline shift, or acute hemorrhage. Mild global atrophy appropriate to age. Vascular: No hyperdense vessel or unexpected calcification. Skull: Cranium is intact. Sinuses/Orbits: Visualized paranasal sinuses and mastoid air cells are clear. Other: Noncontributory CT CERVICAL SPINE FINDINGS Alignment: There is anterolisthesis C4 upon C5 which is related to facet arthropathy. There is reversed cervical lordosis. Skull base and vertebrae: No acute fracture.  No dislocation. Soft tissues and spinal canal: No evidence of spinal hematoma or soft tissue injury. Disc levels: There is severe narrowing of the C5-6 and C6-7 discs with posterior osteophytic ridging. Spinal canal is grossly patent. Upper chest: Emphysema.  No lung mass. Other: Thyroid is unremarkable. IMPRESSION: No acute intracranial pathology. No evidence of cervical spine injury. Chronic changes. Electronically Signed   By: Marybelle Killings M.D.   On: 12/21/2016 13:39   Ct Knee Right Wo Contrast  Result Date: 12/21/2016 CLINICAL DATA:  Acute distal femur fracture. EXAM: CT OF THE RIGHT KNEE WITHOUT CONTRAST TECHNIQUE: Multidetector CT imaging of the right knee was performed according to the standard  protocol. Multiplanar CT image reconstructions were also generated. COMPARISON:  Radiographs dated 12/21/2016 FINDINGS: Bones/Joint/Cartilage There is an old healed fracture of the distal right femoral shaft with an intramedullary nail in place. There is an old fracture of the lateral femoral condyle with 2 screws in place in the lateral femoral condyle. There is an acute impacted transverse fracture of the metaphysis of the distal right femur. There are no acute vertical fractures through the femoral condyles or intertrochanteric notch. IMPRESSION: Impacted transverse fracture of the metaphysis of the distal right femur. Electronically Signed   By: Lorriane Shire M.D.   On: 12/21/2016 16:26   Mr Thoracic Spine Wo Contrast  Result Date: 12/21/2016 CLINICAL DATA:  Initial evaluation for acute low back pain, history of osteoporosis, suspected  compression fracture. EXAM: MRI THORACIC AND LUMBAR SPINE WITHOUT CONTRAST TECHNIQUE: Multiplanar and multiecho pulse sequences of the thoracic and lumbar spine were obtained without intravenous contrast. COMPARISON:  None. FINDINGS: MRI THORACIC SPINE FINDINGS Alignment: Mild exaggeration of the normal thoracic kyphosis. No listhesis or subluxation. Vertebrae: There is abnormal linear T1 hypointense signal intensity with associated T2/STIR hyperintensity involving the T11 vertebral body, consistent with acute compression fracture. Associated height loss of up to 50% the with trace 2 mm bony retropulsion. This fracture is benign/ osteoporotic in appearance. Vertebral body heights are otherwise maintained. No other evidence for acute or chronic fracture. Signal intensity within the vertebral body bone marrow within normal limits. No worrisome osseous lesions. Few small benign hemangiomas noted. Cord:  Signal intensity within the thoracic spinal cord is normal. Paraspinal and other soft tissues: Paraspinous soft tissues within normal limits. Visualized visceral structures  grossly unremarkable. Atelectatic changes present within the posterior right lung base. Disc levels: No significant degenerative changes seen within the thoracic spine. No significant disc bulge or focal disc protrusion. No canal or foraminal stenosis. MRI LUMBAR SPINE FINDINGS Segmentation: Normal segmentation. Lowest well-formed disc is labeled the L5-S1 level. Alignment: Mild exaggeration of the normal thoracic kyphosis. Trace retrolisthesis of L1 on L2 and L2 on L3. Vertebral bodies otherwise normally aligned. Vertebrae: Acute compression fracture involving the T11 vertebral body, described on thoracic spine portion of this report. Vertebral body heights are otherwise maintained. No other evidence for acute or chronic fracture within the lumbar spine. Signal intensity within the vertebral body bone marrow within normal limits. No discrete or worrisome osseous lesions. Conus medullaris: Extends to the L1 level and appears normal. Paraspinal and other soft tissues: Paraspinous soft tissues within normal limits. Left kidney atrophic with scattered cysts. Infrarenal aorta ectatic measuring up to 2.6 cm. Disc levels: L1-2: Trace retrolisthesis of L1 on L2. Mild diffuse disc bulge with bilateral facet hypertrophy. No significant canal or foraminal stenosis. L2-3: Trace retrolisthesis of L2 on L3. Diffuse circumferential disc bulge. Moderate facet hypertrophy. Resultant mild to moderate canal with bilateral lateral recess stenosis, slightly greater on the left. No significant foraminal encroachment. L3-4: Minimal disc bulge. Moderate bilateral facet hypertrophy. Resultant mild canal and bilateral lateral recess stenosis. No significant foraminal encroachment. L4-5: Minimal annular disc bulge. Moderate bilateral facet arthrosis. Resultant mild to moderate left lateral recess stenosis. No significant canal narrowing. Foramina are widely patent. L5-S1: Shallow posterior disc bulge. Moderate bilateral facet arthropathy. No  significant stenosis. IMPRESSION: MR THORACIC SPINE IMPRESSION 1. Acute compression fracture involving the superior endplate of K93 with associated 50% height loss with trace 2 mm bony retropulsion. No significant stenosis. 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 2. No other acute traumatic injury within the thoracic spine. MR LUMBAR SPINE IMPRESSION 1. No acute compression fracture or other abnormality within the lumbar spine. 2. 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 3. Moderate facet arthropathy at L4-5 and L5-S1, which could serve as a source for back pain. 4. **An incidental finding of potential clinical significance has been found. 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 Recommend followup by ultrasound in 5 years. This recommendation follows ACR consensus guidelines: White Paper of the ACR Incidental Findings Committee II on Vascular Findings. J Am Coll Radiol 2013; 10:789-794.** Electronically Signed   By: Jeannine Boga M.D.   On: 12/21/2016 21:28   Mr Lumbar Spine Wo Contrast  Result Date: 12/21/2016 CLINICAL DATA:  Initial evaluation for acute low back pain, history of osteoporosis, suspected compression fracture. EXAM: MRI THORACIC AND LUMBAR SPINE WITHOUT CONTRAST TECHNIQUE: Multiplanar and multiecho pulse sequences of the thoracic and lumbar spine were obtained without intravenous contrast. COMPARISON:  None. FINDINGS: MRI THORACIC SPINE FINDINGS Alignment: Mild exaggeration of the normal thoracic kyphosis. No listhesis or subluxation. Vertebrae: There is abnormal linear T1 hypointense signal intensity with associated T2/STIR hyperintensity involving the T11 vertebral body, consistent with acute compression fracture. Associated height loss of up to 50% the with trace 2 mm bony retropulsion. This fracture is benign/ osteoporotic in  appearance. Vertebral body heights are otherwise maintained. No other evidence for acute or chronic fracture. Signal intensity within the vertebral body bone marrow within normal limits. No worrisome osseous lesions. Few small benign hemangiomas noted. Cord:  Signal intensity within the thoracic spinal cord is normal. Paraspinal and other soft tissues: Paraspinous soft tissues within normal limits. Visualized visceral structures grossly unremarkable. Atelectatic changes present within the posterior right lung base. Disc levels: No significant degenerative changes seen within the thoracic spine. No significant disc bulge or focal disc protrusion. No canal or foraminal stenosis. MRI LUMBAR SPINE FINDINGS Segmentation: Normal segmentation. Lowest well-formed disc is labeled the L5-S1 level. Alignment: Mild exaggeration of the normal thoracic kyphosis. Trace retrolisthesis of L1 on L2 and L2 on L3. Vertebral bodies otherwise normally aligned. Vertebrae: Acute compression fracture involving the T11 vertebral body, described on thoracic spine portion of this report. Vertebral body heights are otherwise maintained. No other evidence for acute or chronic fracture within the lumbar spine. Signal intensity within the vertebral body bone marrow within normal limits. No discrete or worrisome osseous lesions. Conus medullaris: Extends to the L1 level and appears normal. Paraspinal and other soft tissues: Paraspinous soft tissues within normal limits. Left kidney atrophic with scattered cysts. Infrarenal aorta ectatic measuring up to 2.6 cm. Disc levels: L1-2: Trace retrolisthesis of L1 on L2. Mild diffuse disc bulge with bilateral facet hypertrophy. No significant canal or foraminal stenosis. L2-3: Trace retrolisthesis of L2 on L3. Diffuse circumferential disc bulge. Moderate facet hypertrophy. Resultant mild to moderate canal with bilateral lateral recess stenosis, slightly greater on the left. No significant foraminal  encroachment. L3-4: Minimal disc bulge. Moderate bilateral facet hypertrophy. Resultant mild canal and bilateral lateral recess stenosis. No significant foraminal encroachment. L4-5: Minimal annular disc bulge. Moderate bilateral facet arthrosis. Resultant mild to moderate left lateral recess stenosis. No significant canal narrowing. Foramina are widely patent. L5-S1: Shallow posterior disc bulge. Moderate bilateral facet arthropathy. No significant stenosis. IMPRESSION: MR THORACIC SPINE IMPRESSION 1. Acute compression fracture involving the superior endplate of V78 with associated 50% height loss with trace 2 mm bony retropulsion. No significant stenosis. 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 2. No other acute traumatic injury within the thoracic spine. MR LUMBAR SPINE IMPRESSION 1. No acute compression fracture or other abnormality within the lumbar spine. 2. 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 3. Moderate facet arthropathy at L4-5 and L5-S1, which could serve as a source for back pain. 4. **An incidental finding of potential clinical significance has been found. 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 Recommend followup by ultrasound in 5 years. This recommendation follows ACR consensus guidelines: White Paper of the ACR Incidental Findings Committee II on Vascular Findings. J Am Coll Radiol 2013; 10:789-794.** Electronically Signed   By:  Jeannine Boga M.D.   On: 12/21/2016 21:28   Dg Hip Unilat With Pelvis 2-3 Views Right  Result Date: 12/21/2016 CLINICAL DATA:  Right leg pain following a fall 3 days ago. EXAM: DG HIP (WITH OR WITHOUT PELVIS) 2-3V RIGHT COMPARISON:  Right femur radiographs obtained at the same obtained. FINDINGS: Intramedullary rod and screw fixation of the proximal femur. Old, healed proximal femur fracture, laterally. No  acute fracture or dislocation. Diffuse osteopenia. IMPRESSION: No right hip fracture or dislocation. Electronically Signed   By: Claudie Revering M.D.   On: 12/21/2016 13:30   Dg Femur Min 2 Views Right  Result Date: 12/21/2016 CLINICAL DATA:  Right leg pain following a fall 3 days ago. EXAM: RIGHT FEMUR 2 VIEWS COMPARISON:  None. FINDINGS: Intramedullary variety extending the length of the femur with proximal and distal fixation screws. Mildly comminuted fracture of the distal femoral metaphysis with possible extension into the joint space. Posterior displacement and angulation of the distal fragments. Old, healed proximal femur fracture. Diffuse osteopenia. Moderate-sized effusion. Atheromatous arterial calcifications. IMPRESSION: Mildly comminuted fracture of the distal femoral metaphysis with possible extension into the joint space with an associated moderate-sized effusion. Electronically Signed   By: Claudie Revering M.D.   On: 12/21/2016 13:29    Review of Systems  Musculoskeletal: Positive for joint pain.  All other systems reviewed and are negative.  Blood pressure (!) 108/57, pulse (!) 101, temperature 98.3 F (36.8 C), temperature source Oral, resp. rate 17, height 5' 3"  (1.6 m), weight 103 lb (46.7 kg), SpO2 94 %. Physical Exam  Constitutional: She appears well-developed.  HENT:  Head: Normocephalic.  Eyes: Pupils are equal, round, and reactive to light.  Neck: Normal range of motion.  Cardiovascular: Normal rate.   Respiratory: Effort normal.  Neurological: She is alert.  Skin: Skin is warm.  Psychiatric: She has a normal mood and affect.  Examination of bilateral upper chemise demonstrates palpable radial pulses with good grip EPL FPL interosseous strength and reasonable range of motion of the wrist shoulder and elbow without crepitus or grinding.  Left lower extremity demonstrates good ankle dorsiflexion plantar flexion strength with no groin pain with internal/external rotation of the  leg and no pain with passive or active left knee range of motion.  Pedal pulses not palpable bilaterally.  Both feet however are perfused and warm.  Right leg demonstrates some swelling and ecchymosis around the knee.  She has well-healed surgical incision along the anterior lateral femoral condyle.  No groin pain on the right with motion of the right leg.  No fracture blisters are present.  Compartments are soft.  Assessment/Plan: Impression is low energy distal femur fracture below a previously placed IM rod for fracture.  Patient also has 2 screws in place for lateral condyle fracture.  This is a difficult situation for Haivana Nakya.  No great answers are present because of the patient's history of smoking as well as osteoporosis.  Patient on CT scan has arch fragment off the trochlea which really prohibits nonoperative management.  Plan at this time is to attempt open reduction internal fixation to restore alignment and bone stock for possible later surgery.  The risk and benefits including but not limited to infection nerve vessel damage incomplete healing and possible need for more surgery all discussed with Vaughan Basta.  She would be nonweightbearing for a period of at least 6-8 weeks.  Supplemental bone graft will be required.  Patient understands the risk and benefits and wishes to proceed.  All questions answered.  Landry Dyke Amarria Andreasen 12/22/2016, 4:14 PM

## 2016-12-22 NOTE — Progress Notes (Signed)
MD at bedside.  Pt sleepy and is unable to answer questions.  Will administer .04 Narcan and continue to monitor for changes.

## 2016-12-22 NOTE — Progress Notes (Signed)
IR consulted for possible vertebroplasty/kyphoplasty on patient with back pain. She is s/p fall at home yesterday with right femur fracture.   MR Thoracic/Lumbar Spine 7/16: 1. Acute compression fracture involving the superior endplate of Z32 with associated 50% height loss with trace 2 mm bony retropulsion. No significant stenosis. 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 2. No other acute traumatic injury within the thoracic spine.  1. No acute compression fracture or other abnormality within the lumbar spine. 2. 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 3. Moderate facet arthropathy at L4-5 and L5-S1, which could serve as a source for back pain.  Dr. Barbie Banner to bedside to discuss with patient.  Patient reports onset of acute back pain approximately 8 months ago while doing laundry.  She has been managing pain at home and has continued to perform her ADLs as usual.  She does have an acute femur fracture for which the plan is to transfer to [facility] for possible surgery with orthopedics.  Upon exam, Dr. Barbie Banner did not identify any point tenderness in the spine.  Given acute fracture with plans for OR and suspected chronicity of T11 compression fracture Dr. Barbie Banner recommends patient follow-up in 2-4 weeks with IR outpatient clinic for ongoing discussion and management.  Patient will hear from schedulers regarding outpatient clinic appointment.   Brynda Greathouse, MMS RDN PA-C 11:33 AM

## 2016-12-23 ENCOUNTER — Encounter (HOSPITAL_COMMUNITY): Payer: Medicare Other

## 2016-12-23 ENCOUNTER — Encounter (HOSPITAL_COMMUNITY): Payer: Self-pay | Admitting: Orthopedic Surgery

## 2016-12-23 DIAGNOSIS — S0083XA Contusion of other part of head, initial encounter: Secondary | ICD-10-CM

## 2016-12-23 DIAGNOSIS — S72391A Other fracture of shaft of right femur, initial encounter for closed fracture: Secondary | ICD-10-CM

## 2016-12-23 DIAGNOSIS — S72412A Displaced unspecified condyle fracture of lower end of left femur, initial encounter for closed fracture: Secondary | ICD-10-CM

## 2016-12-23 DIAGNOSIS — S72401A Unspecified fracture of lower end of right femur, initial encounter for closed fracture: Secondary | ICD-10-CM

## 2016-12-23 DIAGNOSIS — M4850XD Collapsed vertebra, not elsewhere classified, site unspecified, subsequent encounter for fracture with routine healing: Secondary | ICD-10-CM

## 2016-12-23 LAB — BASIC METABOLIC PANEL
Anion gap: 12 (ref 5–15)
BUN: 11 mg/dL (ref 6–20)
CHLORIDE: 107 mmol/L (ref 101–111)
CO2: 18 mmol/L — ABNORMAL LOW (ref 22–32)
CREATININE: 0.7 mg/dL (ref 0.44–1.00)
Calcium: 8.2 mg/dL — ABNORMAL LOW (ref 8.9–10.3)
GFR calc non Af Amer: >60 mL/min (ref >60)
Glucose, Bld: 135 mg/dL — ABNORMAL HIGH (ref 65–99)
POTASSIUM: 5.5 mmol/L — AB (ref 3.5–5.1)
SODIUM: 137 mmol/L (ref 135–145)

## 2016-12-23 LAB — RETICULOCYTES
RBC.: 3.1 MIL/uL — AB (ref 3.87–5.11)
RETIC COUNT ABSOLUTE: 46.5 10*3/uL (ref 19.0–186.0)
Retic Ct Pct: 1.5 % (ref 0.4–3.1)

## 2016-12-23 LAB — IRON AND TIBC
Iron: 12 ug/dL — ABNORMAL LOW (ref 28–170)
Saturation Ratios: 6 % — ABNORMAL LOW (ref 10.4–31.8)
TIBC: 193 ug/dL — ABNORMAL LOW (ref 250–450)
UIBC: 181 ug/dL

## 2016-12-23 LAB — CBC WITH DIFFERENTIAL/PLATELET
BASOS PCT: 0 %
Basophils Absolute: 0 10*3/uL (ref 0.0–0.1)
EOS ABS: 0 10*3/uL (ref 0.0–0.7)
Eosinophils Relative: 0 %
HCT: 29.6 % — ABNORMAL LOW (ref 36.0–46.0)
HEMOGLOBIN: 9.6 g/dL — AB (ref 12.0–15.0)
Lymphocytes Relative: 4 %
Lymphs Abs: 0.4 10*3/uL — ABNORMAL LOW (ref 0.7–4.0)
MCH: 31 pg (ref 26.0–34.0)
MCHC: 32.4 g/dL (ref 30.0–36.0)
MCV: 95.5 fL (ref 78.0–100.0)
Monocytes Absolute: 0.3 10*3/uL (ref 0.1–1.0)
Monocytes Relative: 3 %
NEUTROS PCT: 93 %
Neutro Abs: 9 10*3/uL — ABNORMAL HIGH (ref 1.7–7.7)
Platelets: 297 10*3/uL (ref 150–400)
RBC: 3.1 MIL/uL — ABNORMAL LOW (ref 3.87–5.11)
RDW: 14.9 % (ref 11.5–15.5)
WBC: 9.7 10*3/uL (ref 4.0–10.5)

## 2016-12-23 LAB — HEPATITIS PANEL, ACUTE
HCV Ab: <0.1 s/co ratio (ref 0.0–0.9)
HEP A IGM: NEGATIVE
HEP B C IGM: NEGATIVE
Hepatitis B Surface Ag: NEGATIVE

## 2016-12-23 LAB — VITAMIN B12: VITAMIN B 12: 106 pg/mL — AB (ref 180–914)

## 2016-12-23 LAB — FOLATE: Folate: 9.8 ng/mL (ref >5.9)

## 2016-12-23 LAB — PROTIME-INR
INR: 1.24
Prothrombin Time: 15.7 seconds — ABNORMAL HIGH (ref 11.4–15.2)

## 2016-12-23 LAB — TSH: TSH: 1.072 u[IU]/mL (ref 0.350–4.500)

## 2016-12-23 LAB — GLUCOSE, CAPILLARY: GLUCOSE-CAPILLARY: 115 mg/dL — AB (ref 65–99)

## 2016-12-23 MED ORDER — CALCIUM CARBONATE-VITAMIN D 500-200 MG-UNIT PO TABS
1.0000 | ORAL_TABLET | Freq: Two times a day (BID) | ORAL | Status: DC
  Administered 2016-12-23 – 2016-12-25 (×4): 1 via ORAL
  Filled 2016-12-23 (×4): qty 1

## 2016-12-23 MED ORDER — ENOXAPARIN SODIUM 30 MG/0.3ML ~~LOC~~ SOLN
30.0000 mg | SUBCUTANEOUS | Status: DC
  Administered 2016-12-23 – 2016-12-24 (×2): 30 mg via SUBCUTANEOUS
  Filled 2016-12-23 (×2): qty 0.3

## 2016-12-23 NOTE — Op Note (Signed)
NAME:  COLLIER, MONICA                  ACCOUNT NO.:  MEDICAL RECORD NO.:  [id number]  LOCATION:                                 FACILITY:  PHYSICIAN:  Anderson Malta, M.D.         DATE OF BIRTH:  DATE OF PROCEDURE: DATE OF DISCHARGE:                              OPERATIVE REPORT   PREOPERATIVE DIAGNOSIS:  Right distal femur fracture.  POSTOPERATIVE DIAGNOSIS:  Right distal femur fracture.  PROCEDURE:  Right distal femur fracture open reduction and internal fixation.  Intercondylar extension present.  Removal of hardware.  SURGEON:  Anderson Malta, M.D.  ASSISTANT:  Laure Kidney, RNFA.  INDICATIONS:  Beonca is an ambulatory, 76 year old female with right distal femur fracture.  She presents for operative management after explanation of risks and benefits.  DESCRIPTION OF PROCEDURE:  The patient was brought to the operating room where general endotracheal anesthetic was induced.  Preoperative antibiotics administered.  Time-out was called.  Right leg prescrubbed with alcohol and Betadine and allowed to air dry, prepped with DuraPrep solution and draped in sterile manner.  After consulting with pharmacy, IV tranexamic acid was instituted.  Tourniquet was not inflated because the patient has poor peripheral pulses distally in her legs bilaterally. The operative field was covered with Ioban after sterile prepping and draping.  The prior incision was made from her lateral condyle fracture. This was extended proximally and posteriorly.  Skin and subcutaneous tissues were sharply divided.  Lateral arthrotomy was made in order to facilitate hardware removal.  Two screws were removed from the condyles. Once these screws were removed, good visualization of the intraarticular nature of the fracture was observed.  Bone quality was poor. Significant comminution was present in the metaphyseal region.  Cortical reeds were difficult due to absence of significant portion of the metaphyseal  bone.  The incision was extended proximally.  Because cables were anticipated to be needed, full exposure of the lateral aspect of the femur was made by splitting the iliotibial band and mobilizing the muscle anteriorly.  Fracture was reduced.  Fluoroscopy was utilized. Plate was applied using traction.  Under traction with an eye towards coronal alignment, the fracture was reduced.  Plate was applied. Initially, K-wires were used in the distal fragments.  One central screw was placed which aided in reduction.  Then, with traction and bone- holding clamps to achieve good coronal alignment and length, 2 proximal screws were placed around the intramedullary nail.  Following this, screw fixation was performed distally using locking screws x6 and proximally using 3 bicortical screws placed around the nail, 4 unicortical screws, and 2 cables.  This gave adequate fixation. Reasonable stability.  Thorough irrigation performed with 3 liters of irrigating solution.  At this time, a combination of DBM putty and calcium phosphate was injected into the metaphyseal defect.  This was allowed to harden.  While it was allowed to harden, topical tranexamic acid was placed into the incision.  At this time, vancomycin powder was placed into the incision.  Then, after fluoroscopic confirmation of good reduction and alignment of the fracture, the incision was closed by closing the arthrotomy with #1 Vicryl suture,  extending proximally, closing the iliotibial band, and tensor fascia lata using #1 Vicryl suture.  Then, the incision was closed using 0 Vicryl suture, 2-0 Vicryl suture, and a 3-0 Monocryl.  Aquacel dressing along with Ace wrap placed.  Knee immobilizer placed.  The patient tolerated the procedure well without immediate complications.  Transferred to the recovery room in stable condition.  1 unit of blood was administered.     Anderson Malta, M.D.     GSD/MEDQ  D:  12/22/2016  T:  12/23/2016   Job:  [id number]

## 2016-12-23 NOTE — Progress Notes (Signed)
Called to pts room by nurse tech for assistance. Pt removed IV, oxygen, pure-wick and 02 sat monitor to forehead. Linens in floor.  Pt incontinent of large liquid stool.  Pt denies pain, when questioned as to where she was pt stated "[place]", I am right here.   Pt freshened up, linens changed, cotton batten from under ace wrap removed due to wetness. Fresh ace wrap reapplied. Incision covered with hydrocolloid.   Call bell in place. Pt supine with arms crossed across her chest, not answering any questions at this time.   Will continue to observe.

## 2016-12-23 NOTE — Progress Notes (Signed)
Pt again removing o2 from face, pulling bed linens and scratching at IV. ILV site newly established by IV team and currently wrapped with kerlex to deter pt from pulling out.

## 2016-12-23 NOTE — Clinical Social Work Note (Signed)
Clinical Social Work Assessment  Patient Details  Name: Jody Moore MRN: [id number] Date of Birth: 1940-10-31  Date of referral:  12/23/16               Reason for consult:  Facility Placement                Permission sought to share information with:    Permission granted to share information::  Yes, Verbal Permission Granted  Name::     step daughter at bedside  Agency::  SNF  Relationship::     Contact Information:     Housing/Transportation Living arrangements for the past 2 months:  Single Family Home Source of Information:  Patient, Adult Children Patient Interpreter Needed:  None Criminal Activity/Legal Involvement Pertinent to Current Situation/Hospitalization:  Yes Significant Relationships:  Adult Children, Other Family Members, Spouse Lives with:  Spouse Do you feel safe going back to the place where you live?  No Need for family participation in patient care:  Yes (Comment)  Care giving concerns:  Pt resided at home with spouse prior to hospitalization and is not safe to return home at this time.  Social Worker assessment / plan:  CSW went to meet with patient and step daughter at bedside to discuss the clinical team's recommendation for short term rehabilitation. Family and patient is in agreement. CSW obtained permission to send out offers to local SNF's to see who can make a bed offer.  Employment status:  Retired Nurse, adult PT Recommendations:  [place] / Referral to community resources:  [place]  Patient/Family's Response to care:  Patient and daughter appreciative of CSW assistance with placement. No issues or concerns identified.  Patient/Family's Understanding of and Emotional Response to Diagnosis, Current Treatment, and Prognosis:  Patient/family has good understanding of diagnosis, current treatment, and prognosis and hopeful that impairment will improve with short term rehab.  No issues or concerns identified.  Emotional Assessment Appearance:  Appears stated age Attitude/Demeanor/Rapport:   (Cooperative) Affect (typically observed):  Accepting, Appropriate, Other (Little confused) Orientation:  Oriented to Self, Oriented to Situation Alcohol / Substance use:  Not Applicable Psych involvement (Current and /or in the community):  No (Comment)  Discharge Needs  Concerns to be addressed:  Care Coordination Readmission within the last 30 days:  No Current discharge risk:  Dependent with Mobility, Physical Impairment Barriers to Discharge:  No Barriers Identified   Normajean Baxter, LCSW 12/23/2016, 11:31 AM

## 2016-12-23 NOTE — Progress Notes (Signed)
Orthopedic Tech Progress Note Patient Details:  Jody Moore Jun 05, 1941 [id number] Replacement  Ortho Devices Type of Ortho Device: Knee Immobilizer Ortho Device/Splint Location: RLE Ortho Device/Splint Interventions: Ordered, Application   Braulio Bosch 12/23/2016, 10:33 PM

## 2016-12-23 NOTE — Evaluation (Signed)
Occupational Therapy Evaluation Patient Details Name: Jody Moore MRN: [id number] DOB: [birth year] Today's Date: 12/23/2016    History of Present Illness Pt is a 76 y/o female admitted secondary to fall, now s/p ORIF R distal femur. PMH including but not limited to cervical cancer and depression.   Clinical Impression   PTA, pt was living with her husband and was independent with ADLs. Currently, pt requires Mod A for ADLs and Max A +2 for functional transfers. Pt unable to maintain NWB status during functional mobility and required Max A to squat pivot from EOB to recliner. Pt would benefit from acute OT to facilitate safe dc. Recommend dc SNF for further OT to increase safety and independence with ADLs and functional mobility.     Follow Up Recommendations  SNF    Equipment Recommendations  Other (comment) (Defer to next venue)    Recommendations for Other Services PT consult     Precautions / Restrictions Precautions Precautions: Fall Precaution Comments: no bending of knee for 1 - 2 weeks per MD note Restrictions Weight Bearing Restrictions: Yes RLE Weight Bearing: Non weight bearing      Mobility Bed Mobility Overal bed mobility: Needs Assistance Bed Mobility: Supine to Sit     Supine to sit: Mod assist;HOB elevated Sit to supine: Max assist;+2 for physical assistance;+2 for safety/equipment   General bed mobility comments: Mod A to bring BLE to EOB and transition hips with pad. Assistance for elevating trunk. Pt required Min verbal and tactile cues for sequencing  Transfers Overall transfer level: Needs assistance Equipment used: None;Rolling walker (2 wheeled) Transfers: Sit to/from [employer] Sit to Stand: Max assist;From elevated surface (with RW)   Squat pivot transfers: Max assist;From elevated surface     General transfer comment: Pt unable to maintain NWB in sit<>stand. Performed squat pivot transfer to recliner. Pt required Max  VCs for hand placement and attempted to push away from recliner instead of placement hands where instructed    Balance Overall balance assessment: Needs assistance Sitting-balance support: Feet supported;No upper extremity supported Sitting balance-Leahy Scale: Fair Sitting balance - Comments: maintain sitting balance at EOB for grooming   Standing balance support: Bilateral upper extremity supported;During functional activity Standing balance-Leahy Scale: Zero Standing balance comment: max A x2 for standing balance                           ADL either performed or assessed with clinical judgement   ADL Overall ADL's : Needs assistance/impaired Eating/Feeding: Set up;Sitting   Grooming: Sitting;Min guard;Brushing hair Grooming Details (indicate cue type and reason): EOB with Min guard A Upper Body Bathing: Minimal assistance;Sitting   Lower Body Bathing: Moderate assistance;Bed level   Upper Body Dressing : Minimal assistance;Sitting   Lower Body Dressing: Moderate assistance;Bed level Lower Body Dressing Details (indicate cue type and reason): Pt able to reach down and adjust socks while in long sitting at bed level.  Toilet Transfer: Maximal assistance;+2 for physical assistance;Squat-pivot (Simulated to recliner) Toilet Transfer Details (indicate cue type and reason): Unable to maintain NWB status           General ADL Comments: Pt demonstrating good motivation and potentional for rehab. However,pt unable to maintain NWB status and requires Max A +2 for functional transfers     Vision         Perception     Praxis      Pertinent Vitals/Pain Pain Assessment: 0-10 Pain Score:  5  Faces Pain Scale: Hurts even more Pain Location: R leg Pain Descriptors / Indicators: Grimacing;Guarding;Moaning Pain Intervention(s): Monitored during session;Limited activity within patient's tolerance;Repositioned     Hand Dominance Right   Extremity/Trunk Assessment  Upper Extremity Assessment Upper Extremity Assessment: Generalized weakness   Lower Extremity Assessment Lower Extremity Assessment: Defer to PT evaluation RLE Deficits / Details: pt with decreased strength and ROM limitations secondary to post-op and MD precautions. RLE: Unable to fully assess due to pain;Unable to fully assess due to immobilization   Cervical / Trunk Assessment Cervical / Trunk Assessment: Kyphotic   Communication Communication Communication: No difficulties   Cognition Arousal/Alertness: Awake/alert Behavior During Therapy: WFL for tasks assessed/performed Overall Cognitive Status: Within Functional Limits for tasks assessed                                 General Comments: Pt daughter present to confirm PLOF and pt baseline cogntition   General Comments  Pt very pleasant and daughter present during session    Exercises     Shoulder Instructions      Home Living Family/patient expects to be discharged to:: Private residence Living Arrangements: Spouse/significant other Available Help at Discharge: Family;Available 24 hours/day Type of Home: House Home Access: Stairs to enter [employer] of Steps: 4 Entrance Stairs-Rails: Can reach both Home Layout: Two level;Able to live on main level with bedroom/bathroom     Bathroom Shower/Tub: Tub/shower unit;Curtain   Bathroom Toilet: Handicapped height     Home Equipment: Shower seat;Tub bench;Bedside commode (Rollator)          Prior Functioning/Environment Level of Independence: Independent        Comments: ADLs, IADLs,        OT Problem List: Decreased strength;Decreased range of motion;Decreased activity tolerance;Impaired balance (sitting and/or standing);Decreased safety awareness;Decreased knowledge of use of DME or AE;Decreased knowledge of precautions;Pain      OT Treatment/Interventions: Self-care/ADL training;Therapeutic exercise;Energy conservation;DME and/or  AE instruction;Therapeutic activities;Patient/family education    OT Goals(Current goals can be found in the care plan section) Acute Rehab OT Goals Patient Stated Goal: decrease pain OT Goal Formulation: With patient Time For Goal Achievement: 01/06/17 Potential to Achieve Goals: Good ADL Goals Pt Will Perform Grooming: with set-up;with supervision;sitting Pt Will Perform Upper Body Bathing: with set-up;with supervision;sitting Pt Will Perform Lower Body Bathing: sit to/from stand;with max assist Pt Will Perform Upper Body Dressing: with set-up;with supervision;sitting Pt Will Perform Lower Body Dressing: with max assist;sit to/from stand;with adaptive equipment Pt Will Transfer to Toilet: squat pivot transfer;bedside commode;with max assist (adhering to NWB status) Pt Will Perform Toileting - Clothing Manipulation and hygiene: with mod assist;sit to/from stand  OT Frequency: Min 2X/week   Barriers to D/C:            Co-evaluation              AM-PAC PT "6 Clicks" Daily Activity     Outcome Measure Help from another person eating meals?: None Help from another person taking care of personal grooming?: A Little Help from another person toileting, which includes using toliet, bedpan, or urinal?: A Lot Help from another person bathing (including washing, rinsing, drying)?: A Lot Help from another person to put on and taking off regular upper body clothing?: A Little Help from another person to put on and taking off regular lower body clothing?: A Lot 6 Click Score: 16   End of Session  Equipment Utilized During Treatment: Administrator, arts Communication: Mobility status;Precautions  Activity Tolerance: Patient tolerated treatment well;Patient limited by pain;Patient limited by fatigue Patient left: in chair;with call bell/phone within reach;with family/visitor present  OT Visit Diagnosis: Unsteadiness on feet (R26.81);Other abnormalities of gait and mobility  (R26.89);Muscle weakness (generalized) (M62.81);Pain Pain - Right/Left: Right Pain - part of body: Leg                Time: 5732-2025 OT Time Calculation (min): 33 min Charges:  OT General Charges $OT Visit: 1 Procedure OT Evaluation $OT Eval Low Complexity: 1 Procedure OT Treatments $Self Care/Home Management : 8-22 mins G-Codes:     [place], OTR/L Acute Rehab Pager: [phone] Office: [place] 12/23/2016, 4:48 PM

## 2016-12-23 NOTE — Progress Notes (Signed)
Subjective: Pt stable - pain ok   Objective: Vital signs in last 24 hours: Temp:  [97.2 F (36.2 C)-98.6 F (37 C)] 97.8 F (36.6 C) (07/18 0705) Pulse Rate:  [70-77] 75 (07/18 0705) Resp:  [9-25] 16 (07/18 0705) BP: (93-124)/(48-65) 106/48 (07/18 0705) SpO2:  [90 %-100 %] 99 % (07/18 0705)  Intake/Output from previous day: 07/17 0701 - 07/18 0700 In: 3831.3 [I.V.:2846.3; Blood:335; IV Piggyback:250] Out: 750 [Urine:550; Blood:200] Intake/Output this shift: No intake/output data recorded.  Exam:  Sensation intact distally Dorsiflexion/Plantar flexion intact Compartment soft  Labs:  Recent Labs  12/21/16 1232 12/21/16 1854 12/22/16 0546 12/23/16 0253  HGB 10.2* 9.7* 9.0* 9.6*    Recent Labs  12/22/16 0546 12/23/16 0253  WBC 6.5 9.7  RBC 2.91* 3.10*  3.10*  HCT 27.1* 29.6*  PLT 291 297    Recent Labs  12/22/16 0546 12/23/16 0253  NA 139 137  K 3.3* 5.5*  CL 107 107  CO2 23 18*  BUN 16 11  CREATININE 0.59 0.70  GLUCOSE 101* 135*  CALCIUM 8.5* 8.2*    Recent Labs  12/22/16 0546 12/23/16 0253  INR 1.20 1.24    Assessment/Plan: Pt stable - nwb and no bending of knee for 1 - 2 weeks - start lovenox today - hgb ok   G Scott Dean 12/23/2016, 8:07 AM

## 2016-12-23 NOTE — Progress Notes (Signed)
Pt requires use of bedpan (fracture ) pan as opposed to [facility] at this time. Pt not able to assist or complete BSC transfers. Utilize 2 staff to place on bedpan for ease of pt.

## 2016-12-23 NOTE — Progress Notes (Signed)
PROGRESS NOTE    Jody Moore  JJK:[id number] DOB: 1940-11-06 DOA: 12/21/2016 PCP: Gildardo Cranker, DO     Brief Narrative:  76 y.o. female H/o osteoporosis, depression present with fall from home. CT right leg with Impacted transverse fracture of the metaphysis of the distal right femur S/P surgical correction .  Assessment & Plan:   1-S/P open reduction internal fixation of distal R femoral Fracture : Continue pain control and DVT prophylaxis PT OT nonweightbearing . 2-Acute compression fracture involving the superior endplate of B71: per MRI (associated 50% height loss with trace 2 mm bony retropulsion. No significant stenosis. This is benign/osteoporotic in appearance.), Continue pain control and vit-D/Calcium supplementation. 3-Hx of osteoporosis : Continue vitamin D calcium supplementation. 4 history of depression : Continue Paxil. DVT prophylaxis with Lovenox     DVT prophylaxis: (Lovenox) Code Status: (Full) Family Communication: (Daughter at bedside) Disposition Plan: (Skilled nursing facility)   Consultants:   ortho  Procedures:    Antimicrobials: (specify start and planned stop date. Auto populated tables are space occupying and do not give end dates)  None      Subjective:   The patient is denying any complaints today , she is having no fevers or chills or nausea or vomiting the pain is controlled currently .  Objective: Vitals:   12/22/16 2252 12/23/16 0250 12/23/16 0705 12/23/16 1112  BP: (!) 124/50 (!) 109/52 (!) 106/48 (!) 128/40  Pulse: 70 72 75 78  Resp: 14 15 16 18   Temp: (!) 97.4 F (36.3 C) 98.6 F (37 C) 97.8 F (36.6 C) 97.7 F (36.5 C)  TempSrc: Oral Oral Oral Axillary  SpO2: 97% 96% 99% 93%  Weight:      Height:        Intake/Output Summary (Last 24 hours) at 12/23/16 1409 Last data filed at 12/23/16 1100  Gross per 24 hour  Intake          3531.25 ml  Output              750 ml  Net          2781.25 ml   Filed  Weights   12/21/16 1124  Weight: 46.7 kg (103 lb)    Examination:  General exam: Appears calm and comfortable  Respiratory system: Clear to auscultation. Respiratory effort normal. Cardiovascular system: RRR,S1S2. Gastrointestinal system: Abdomen is nondistended, soft and nontender. No organomegaly or masses felt. Normal bowel sounds heard. Central nervous system: Alert and oriented. No focal neurological deficits. Extremities: Symmetric 5 x 5 power. Skin: No rashes, lesions or ulcers Psychiatry: Judgement and insight appear normal. Mood & affect appropriate.     Data Reviewed: I have personally reviewed following labs and imaging studies  CBC:  Recent Labs Lab 12/21/16 1232 12/21/16 1854 12/22/16 0546 12/23/16 0253  WBC 12.5* 10.8* 6.5 9.7  NEUTROABS  --   --  3.7 9.0*  HGB 10.2* 9.7* 9.0* 9.6*  HCT 30.7* 29.4* 27.1* 29.6*  MCV 94.5 95.1 93.1 95.5  PLT 327 319 291 696   Basic Metabolic Panel:  Recent Labs Lab 12/21/16 1232 12/21/16 1854 12/22/16 0546 12/23/16 0253  NA 138  --  139 137  K 3.4*  --  3.3* 5.5*  CL 105  --  107 107  CO2 22  --  23 18*  GLUCOSE 109*  --  101* 135*  BUN 20  --  16 11  CREATININE 0.71 0.69 0.59 0.70  CALCIUM 8.6*  --  8.5* 8.2*  MG  --   --  1.6*  --    GFR: Estimated Creatinine Clearance: 44.8 mL/min (by C-G formula based on SCr of 0.7 mg/dL). Liver Function Tests:  Recent Labs Lab 12/21/16 1232 12/22/16 0546  AST 71* 55*  ALT 30 28  ALKPHOS 73 60  BILITOT 0.8 1.0  PROT 6.6 5.6*  ALBUMIN 3.2* 2.6*   No results for input(s): LIPASE, AMYLASE in the last 168 hours. No results for input(s): AMMONIA in the last 168 hours. Coagulation Profile:  Recent Labs Lab 12/22/16 0546 12/23/16 0253  INR 1.20 1.24   Cardiac Enzymes:  Recent Labs Lab 12/22/16 0546  CKTOTAL 520*   BNP (last 3 results) No results for input(s): PROBNP in the last 8760 hours. HbA1C: No results for input(s): HGBA1C in the last 72  hours. CBG: No results for input(s): GLUCAP in the last 168 hours. Lipid Profile: No results for input(s): CHOL, HDL, LDLCALC, TRIG, CHOLHDL, LDLDIRECT in the last 72 hours. Thyroid Function Tests:  Recent Labs  12/23/16 0253  TSH 1.072   Anemia Panel:  Recent Labs  12/23/16 0253  VITAMINB12 106*  FOLATE 9.8  TIBC 193*  IRON 12*  RETICCTPCT 1.5   Urine analysis:    Component Value Date/Time   COLORURINE YELLOW 12/21/2016 1414   APPEARANCEUR HAZY (A) 12/21/2016 1414   LABSPEC 1.021 12/21/2016 1414   PHURINE 5.0 12/21/2016 1414   GLUCOSEU NEGATIVE 12/21/2016 1414   HGBUR MODERATE (A) 12/21/2016 1414   BILIRUBINUR NEGATIVE 12/21/2016 1414   KETONESUR 80 (A) 12/21/2016 1414   PROTEINUR 30 (A) 12/21/2016 1414   NITRITE NEGATIVE 12/21/2016 1414   LEUKOCYTESUR NEGATIVE 12/21/2016 1414   Sepsis Labs: @LABRCNTIP (procalcitonin:4,lacticidven:4)  ) Recent Results (from the past 240 hour(s))  Surgical pcr screen     Status: None   Collection Time: 12/22/16 11:15 AM  Result Value Ref Range Status   MRSA, PCR NEGATIVE NEGATIVE Final   Staphylococcus aureus NEGATIVE NEGATIVE Final    Comment:        The Xpert SA Assay (FDA approved for NASAL specimens in patients over 68 years of age), is one component of a comprehensive surveillance program.  Test performance has been validated by [facility] for patients greater than or equal to 38 year old. It is not intended to diagnose infection nor to guide or monitor treatment.          Radiology Studies: Dg Ankle 2 Views Right  Result Date: 12/21/2016 CLINICAL DATA:  Remote right ankle fracture.  Ankle now on stable. EXAM: RIGHT ANKLE - 2 VIEW COMPARISON:  None. FINDINGS: Two views study shows plate and screw fixation of the distal tibia with persistent apex posterior angulation. Degenerative changes are noted at the tibiotalar joint. Relationship of the distal fibula to the ankle mortise not well demonstrated on this  study. Bones are diffusely demineralized. IMPRESSION: 1. Posttraumatic deformity of the distal tibia with evidence of ORIF. 2. Osteoarthritis of the tibiotalar joint. Electronically Signed   By: Misty Stanley M.D.   On: 12/21/2016 18:54   Ct Knee Right Wo Contrast  Result Date: 12/21/2016 CLINICAL DATA:  Acute distal femur fracture. EXAM: CT OF THE RIGHT KNEE WITHOUT CONTRAST TECHNIQUE: Multidetector CT imaging of the right knee was performed according to the standard protocol. Multiplanar CT image reconstructions were also generated. COMPARISON:  Radiographs dated 12/21/2016 FINDINGS: Bones/Joint/Cartilage There is an old healed fracture of the distal right femoral shaft with an intramedullary nail in place. There is an old  fracture of the lateral femoral condyle with 2 screws in place in the lateral femoral condyle. There is an acute impacted transverse fracture of the metaphysis of the distal right femur. There are no acute vertical fractures through the femoral condyles or intertrochanteric notch. IMPRESSION: Impacted transverse fracture of the metaphysis of the distal right femur. Electronically Signed   By: Lorriane Shire M.D.   On: 12/21/2016 16:26   Mr Thoracic Spine Wo Contrast  Result Date: 12/21/2016 CLINICAL DATA:  Initial evaluation for acute low back pain, history of osteoporosis, suspected compression fracture. EXAM: MRI THORACIC AND LUMBAR SPINE WITHOUT CONTRAST TECHNIQUE: Multiplanar and multiecho pulse sequences of the thoracic and lumbar spine were obtained without intravenous contrast. COMPARISON:  None. FINDINGS: MRI THORACIC SPINE FINDINGS Alignment: Mild exaggeration of the normal thoracic kyphosis. No listhesis or subluxation. Vertebrae: There is abnormal linear T1 hypointense signal intensity with associated T2/STIR hyperintensity involving the T11 vertebral body, consistent with acute compression fracture. Associated height loss of up to 50% the with trace 2 mm bony retropulsion.  This fracture is benign/ osteoporotic in appearance. Vertebral body heights are otherwise maintained. No other evidence for acute or chronic fracture. Signal intensity within the vertebral body bone marrow within normal limits. No worrisome osseous lesions. Few small benign hemangiomas noted. Cord:  Signal intensity within the thoracic spinal cord is normal. Paraspinal and other soft tissues: Paraspinous soft tissues within normal limits. Visualized visceral structures grossly unremarkable. Atelectatic changes present within the posterior right lung base. Disc levels: No significant degenerative changes seen within the thoracic spine. No significant disc bulge or focal disc protrusion. No canal or foraminal stenosis. MRI LUMBAR SPINE FINDINGS Segmentation: Normal segmentation. Lowest well-formed disc is labeled the L5-S1 level. Alignment: Mild exaggeration of the normal thoracic kyphosis. Trace retrolisthesis of L1 on L2 and L2 on L3. Vertebral bodies otherwise normally aligned. Vertebrae: Acute compression fracture involving the T11 vertebral body, described on thoracic spine portion of this report. Vertebral body heights are otherwise maintained. No other evidence for acute or chronic fracture within the lumbar spine. Signal intensity within the vertebral body bone marrow within normal limits. No discrete or worrisome osseous lesions. Conus medullaris: Extends to the L1 level and appears normal. Paraspinal and other soft tissues: Paraspinous soft tissues within normal limits. Left kidney atrophic with scattered cysts. Infrarenal aorta ectatic measuring up to 2.6 cm. Disc levels: L1-2: Trace retrolisthesis of L1 on L2. Mild diffuse disc bulge with bilateral facet hypertrophy. No significant canal or foraminal stenosis. L2-3: Trace retrolisthesis of L2 on L3. Diffuse circumferential disc bulge. Moderate facet hypertrophy. Resultant mild to moderate canal with bilateral lateral recess stenosis, slightly greater on  the left. No significant foraminal encroachment. L3-4: Minimal disc bulge. Moderate bilateral facet hypertrophy. Resultant mild canal and bilateral lateral recess stenosis. No significant foraminal encroachment. L4-5: Minimal annular disc bulge. Moderate bilateral facet arthrosis. Resultant mild to moderate left lateral recess stenosis. No significant canal narrowing. Foramina are widely patent. L5-S1: Shallow posterior disc bulge. Moderate bilateral facet arthropathy. No significant stenosis. IMPRESSION: MR THORACIC SPINE IMPRESSION 1. Acute compression fracture involving the superior endplate of C58 with associated 50% height loss with trace 2 mm bony retropulsion. No significant stenosis. 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 2. No other acute traumatic injury within the thoracic spine. MR LUMBAR SPINE IMPRESSION 1. No acute compression fracture or other abnormality within the lumbar spine. 2. Multilevel degenerative spondylolysis as above with resultant mild to moderate canal and bilateral lateral recess stenosis, most prominent at  L2-3. 3. Moderate facet arthropathy at L4-5 and L5-S1, which could serve as a source for back pain. 4. **An incidental finding of potential clinical significance has been found. 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 Recommend followup by ultrasound in 5 years. This recommendation follows ACR consensus guidelines: White Paper of the ACR Incidental Findings Committee II on Vascular Findings. J Am Coll Radiol 2013; 10:789-794.** Electronically Signed   By: Jeannine Boga M.D.   On: 12/21/2016 21:28   Mr Lumbar Spine Wo Contrast  Result Date: 12/21/2016 CLINICAL DATA:  Initial evaluation for acute low back pain, history of osteoporosis, suspected compression fracture. EXAM: MRI THORACIC AND LUMBAR SPINE WITHOUT CONTRAST TECHNIQUE: Multiplanar and multiecho pulse sequences of the thoracic and lumbar spine were obtained without  intravenous contrast. COMPARISON:  None. FINDINGS: MRI THORACIC SPINE FINDINGS Alignment: Mild exaggeration of the normal thoracic kyphosis. No listhesis or subluxation. Vertebrae: There is abnormal linear T1 hypointense signal intensity with associated T2/STIR hyperintensity involving the T11 vertebral body, consistent with acute compression fracture. Associated height loss of up to 50% the with trace 2 mm bony retropulsion. This fracture is benign/ osteoporotic in appearance. Vertebral body heights are otherwise maintained. No other evidence for acute or chronic fracture. Signal intensity within the vertebral body bone marrow within normal limits. No worrisome osseous lesions. Few small benign hemangiomas noted. Cord:  Signal intensity within the thoracic spinal cord is normal. Paraspinal and other soft tissues: Paraspinous soft tissues within normal limits. Visualized visceral structures grossly unremarkable. Atelectatic changes present within the posterior right lung base. Disc levels: No significant degenerative changes seen within the thoracic spine. No significant disc bulge or focal disc protrusion. No canal or foraminal stenosis. MRI LUMBAR SPINE FINDINGS Segmentation: Normal segmentation. Lowest well-formed disc is labeled the L5-S1 level. Alignment: Mild exaggeration of the normal thoracic kyphosis. Trace retrolisthesis of L1 on L2 and L2 on L3. Vertebral bodies otherwise normally aligned. Vertebrae: Acute compression fracture involving the T11 vertebral body, described on thoracic spine portion of this report. Vertebral body heights are otherwise maintained. No other evidence for acute or chronic fracture within the lumbar spine. Signal intensity within the vertebral body bone marrow within normal limits. No discrete or worrisome osseous lesions. Conus medullaris: Extends to the L1 level and appears normal. Paraspinal and other soft tissues: Paraspinous soft tissues within normal limits. Left kidney  atrophic with scattered cysts. Infrarenal aorta ectatic measuring up to 2.6 cm. Disc levels: L1-2: Trace retrolisthesis of L1 on L2. Mild diffuse disc bulge with bilateral facet hypertrophy. No significant canal or foraminal stenosis. L2-3: Trace retrolisthesis of L2 on L3. Diffuse circumferential disc bulge. Moderate facet hypertrophy. Resultant mild to moderate canal with bilateral lateral recess stenosis, slightly greater on the left. No significant foraminal encroachment. L3-4: Minimal disc bulge. Moderate bilateral facet hypertrophy. Resultant mild canal and bilateral lateral recess stenosis. No significant foraminal encroachment. L4-5: Minimal annular disc bulge. Moderate bilateral facet arthrosis. Resultant mild to moderate left lateral recess stenosis. No significant canal narrowing. Foramina are widely patent. L5-S1: Shallow posterior disc bulge. Moderate bilateral facet arthropathy. No significant stenosis. IMPRESSION: MR THORACIC SPINE IMPRESSION 1. Acute compression fracture involving the superior endplate of O67 with associated 50% height loss with trace 2 mm bony retropulsion. No significant stenosis. 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 2. No other acute traumatic injury within the thoracic spine. MR LUMBAR SPINE IMPRESSION 1. No acute compression fracture or other abnormality within the lumbar spine. 2. Multilevel degenerative  spondylolysis as above with resultant mild to moderate canal and bilateral lateral recess stenosis, most prominent at L2-3. 3. Moderate facet arthropathy at L4-5 and L5-S1, which could serve as a source for back pain. 4. **An incidental finding of potential clinical significance has been found. 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 Recommend followup by ultrasound in 5 years. This recommendation follows ACR consensus guidelines: White Paper of the ACR Incidental Findings Committee II on Vascular Findings. J Am Coll Radiol 2013;  10:789-794.** Electronically Signed   By: Jeannine Boga M.D.   On: 12/21/2016 21:28   Pelvis Portable  Result Date: 12/22/2016 CLINICAL DATA:  Recent ORIF of right femoral fracture EXAM: PORTABLE PELVIS 1-2 VIEWS COMPARISON:  02/21/2017 FINDINGS: Postsurgical changes are again noted in the proximal right femur stable from the prior exam. Pelvic ring is intact. Degenerative changes of the lumbar spine are noted. No soft tissue changes are seen. IMPRESSION: Chronic changes in the pelvis.  No acute abnormality noted. Electronically Signed   By: Inez Catalina M.D.   On: 12/22/2016 21:34   Dg C-arm Gt 120 Min  Result Date: 12/22/2016 CLINICAL DATA:  ORIF of right femoral fracture EXAM: RIGHT FEMUR 2 VIEWS; DG C-ARM GT 120 MIN COMPARISON:  12/21/2016 FLUOROSCOPY TIME:  Fluoroscopy Time:  1 minutes 31 seconds Radiation Exposure Index (if provided by the fluoroscopic device): Not available Number of Acquired Spot Images: Sex FINDINGS: Fixation sideplate is noted along the distal femur with multiple fixation screws. Additionally the previously seen medullary rod remains in place. Multiple cerclage wires are noted as well. Fracture fragments are in near anatomic alignment. Removal of the previously placed screws in the lateral femoral condyles is noted. Bone cement is noted in the distal fracture site. IMPRESSION: ORIF of distal right femoral fracture Electronically Signed   By: Inez Catalina M.D.   On: 12/22/2016 21:21   Dg Femur, Min 2 Views Right  Result Date: 12/22/2016 CLINICAL DATA:  ORIF of right femoral fracture EXAM: RIGHT FEMUR 2 VIEWS; DG C-ARM GT 120 MIN COMPARISON:  12/21/2016 FLUOROSCOPY TIME:  Fluoroscopy Time:  1 minutes 31 seconds Radiation Exposure Index (if provided by the fluoroscopic device): Not available Number of Acquired Spot Images: Sex FINDINGS: Fixation sideplate is noted along the distal femur with multiple fixation screws. Additionally the previously seen medullary rod remains in  place. Multiple cerclage wires are noted as well. Fracture fragments are in near anatomic alignment. Removal of the previously placed screws in the lateral femoral condyles is noted. Bone cement is noted in the distal fracture site. IMPRESSION: ORIF of distal right femoral fracture Electronically Signed   By: Inez Catalina M.D.   On: 12/22/2016 21:21        Scheduled Meds: . enoxaparin (LOVENOX) injection  30 mg Subcutaneous Q24H  . feeding supplement (ENSURE ENLIVE)  237 mL Oral BID BM  . PARoxetine  40 mg Oral Daily  . polyethylene glycol  17 g Oral Daily  . senna-docusate  1 tablet Oral BID   Continuous Infusions: . 0.9 % NaCl with KCl 20 mEq / L 75 mL/hr at 12/22/16 2323  . lactated ringers Stopped (12/23/16 0413)  . methocarbamol (ROBAXIN)  IV    . tranexamic acid (CYKLOKAPRON) topical -INTRAOP       LOS: 2 days    Time spent: Beatty, MD [facility] Pager [phone]  If 7PM-7AM, please contact night-coverage [email or web address] Password [facility] 12/23/2016, 2:09  PM

## 2016-12-23 NOTE — Anesthesia Postprocedure Evaluation (Signed)
Anesthesia Post Note  Patient: [name]  Procedure(s) Performed: Procedure(s) (LRB): OPEN REDUCTION INTERNAL FIXATION (ORIF) DISTAL FEMUR FRACTURE (Right)     Patient location during evaluation: PACU Anesthesia Type: General Level of consciousness: awake and alert Pain management: pain level controlled Vital Signs Assessment: post-procedure vital signs reviewed and stable Respiratory status: spontaneous breathing, nonlabored ventilation, respiratory function stable and patient connected to nasal cannula oxygen Cardiovascular status: blood pressure returned to baseline and stable Postop Assessment: no signs of nausea or vomiting Anesthetic complications: no Comments: Patient given Narcan in PACU for persistent drowsiness with appropriate improvement in alertness.    Last Vitals:  Vitals:   12/22/16 2230 12/22/16 2252  BP:  (!) 124/50  Pulse: 73 70  Resp: (!) 21 14  Temp: (!) 36.2 C (!) 36.3 C    Last Pain:  Vitals:   12/22/16 2252  TempSrc: Oral  PainSc:                  Catalina Gravel

## 2016-12-23 NOTE — Evaluation (Signed)
Physical Therapy Evaluation Patient Details Name: Jody Moore MRN: [id number] DOB: Dec 16, 1940 Today's Date: 12/23/2016   History of Present Illness  Pt is a 76 y/o female admitted secondary to fall, now s/p ORIF R distal femur. PMH including but not limited to cervical cancer and depression.  Clinical Impression  Pt presented sitting on the [facility], awake and willing to participate in therapy session. Pt requesting assistance with adjusting on the [facility] as she was uncomfortable and no staff present at this time. Prior to admission, pt reported that she was independent with all functional mobility and ADLs. Pt currently requires physical assistance of two to perform bed mobility and transfers with the [facility]. Pt very limited secondary to pain and weakness. Pt would continue to benefit from skilled physical therapy services at this time while admitted and after d/c to address the below listed limitations in order to improve overall safety and independence with functional mobility.     Follow Up Recommendations SNF    Equipment Recommendations  None recommended by PT    Recommendations for Other Services       Precautions / Restrictions Precautions Precautions: Fall Precaution Comments: no bending of knee for 1 - 2 weeks per MD note Restrictions Weight Bearing Restrictions: Yes RLE Weight Bearing: Non weight bearing      Mobility  Bed Mobility Overal bed mobility: Needs Assistance Bed Mobility: Sit to Supine       Sit to supine: Max assist;+2 for physical assistance;+2 for safety/equipment   General bed mobility comments: increased time, cueing for sequencing, assist at trunk and bilateral LEs to return to supine  Transfers Overall transfer level: Needs assistance   Transfers: Sit to/from Stand Sit to Stand: Max assist;+2 physical assistance;+2 safety/equipment         General transfer comment: initially attempted sit<>stand from [facility] with [facility] and two person physical  assist, but pt very anxious and fearful. The Stedy was then used; pt ablet o pull with bilateral UEs on Stedy to achieve standing position.   Ambulation/Gait                Stairs            Wheelchair Mobility    Modified Rankin (Stroke Patients Only)       Balance Overall balance assessment: Needs assistance Sitting-balance support: Feet supported;No upper extremity supported Sitting balance-Leahy Scale: Fair     Standing balance support: Bilateral upper extremity supported;During functional activity Standing balance-Leahy Scale: Zero Standing balance comment: max A x2 for standing balance                             Pertinent Vitals/Pain Pain Assessment: Faces Faces Pain Scale: Hurts even more Pain Location: R leg Pain Descriptors / Indicators: Grimacing;Guarding;Moaning Pain Intervention(s): Monitored during session;Repositioned    Home Living Family/patient expects to be discharged to:: Private residence Living Arrangements: Spouse/significant other Available Help at Discharge: Family;Available 24 hours/day Type of Home: House Home Access: Stairs to enter Entrance Stairs-Rails: Can reach both Entrance Stairs-Number of Steps: 4 Home Layout: Two level;Able to live on main level with bedroom/bathroom Home Equipment: Shower seat;Tub bench;Bedside commode (Rollator)      Prior Function Level of Independence: Independent               Hand Dominance   Dominant Hand: Right    Extremity/Trunk Assessment   Upper Extremity Assessment Upper Extremity Assessment: Defer to OT evaluation  Lower Extremity Assessment Lower Extremity Assessment: Generalized weakness;RLE deficits/detail RLE Deficits / Details: pt with decreased strength and ROM limitations secondary to post-op and MD precautions. RLE: Unable to fully assess due to pain;Unable to fully assess due to immobilization    Cervical / Trunk Assessment Cervical / Trunk  Assessment: Kyphotic  Communication   Communication: No difficulties  Cognition Arousal/Alertness: Awake/alert Behavior During Therapy: WFL for tasks assessed/performed Overall Cognitive Status: Within Functional Limits for tasks assessed                                        General Comments      Exercises     Assessment/Plan    PT Assessment Patient needs continued PT services  PT Problem List Decreased strength;Decreased range of motion;Decreased activity tolerance;Decreased balance;Decreased mobility;Decreased coordination;Decreased knowledge of use of DME;Decreased safety awareness;Decreased knowledge of precautions;Pain       PT Treatment Interventions DME instruction;Gait training;Stair training;Therapeutic activities;Functional mobility training;Therapeutic exercise;Balance training;Neuromuscular re-education;Patient/family education    PT Goals (Current goals can be found in the Care Plan section)  Acute Rehab PT Goals Patient Stated Goal: decrease pain PT Goal Formulation: With patient Time For Goal Achievement: 01/06/17 Potential to Achieve Goals: Fair    Frequency Min 3X/week   Barriers to discharge        Co-evaluation               AM-PAC PT "6 Clicks" Daily Activity  Outcome Measure Difficulty turning over in bed (including adjusting bedclothes, sheets and blankets)?: Total Difficulty moving from lying on back to sitting on the side of the bed? : Total Difficulty sitting down on and standing up from a chair with arms (e.g., wheelchair, bedside commode, etc,.)?: Total Help needed moving to and from a bed to chair (including a wheelchair)?: A Lot Help needed walking in hospital room?: Total Help needed climbing 3-5 steps with a railing? : Total 6 Click Score: 7    End of Session Equipment Utilized During Treatment: Gait belt;Other (comment) Charlaine [name]) Activity Tolerance: Patient limited by pain Patient left: in bed;with call  bell/phone within reach;with family/visitor present Nurse Communication: Mobility status;Need for lift equipment PT Visit Diagnosis: Other abnormalities of gait and mobility (R26.89);History of falling (Z91.81);Pain Pain - Right/Left: Right Pain - part of body: Leg    Time: 6073-7106 PT Time Calculation (min) (ACUTE ONLY): 25 min   Charges:   PT Evaluation $PT Eval Moderate Complexity: 1 Procedure PT Treatments $Therapeutic Activity: 8-22 mins   PT G Codes:        [place], PT, DPT [place] 12/23/2016, 2:45 PM

## 2016-12-24 ENCOUNTER — Inpatient Hospital Stay (HOSPITAL_COMMUNITY): Payer: Medicare Other

## 2016-12-24 LAB — COMPREHENSIVE METABOLIC PANEL
ALK PHOS: 53 U/L (ref 38–126)
ALT: 11 U/L — ABNORMAL LOW (ref 14–54)
ANION GAP: 8 (ref 5–15)
AST: 45 U/L — ABNORMAL HIGH (ref 15–41)
Albumin: 2.3 g/dL — ABNORMAL LOW (ref 3.5–5.0)
BUN: 9 mg/dL (ref 6–20)
CALCIUM: 8.4 mg/dL — AB (ref 8.9–10.3)
CO2: 21 mmol/L — AB (ref 22–32)
Chloride: 108 mmol/L (ref 101–111)
Creatinine, Ser: 0.49 mg/dL (ref 0.44–1.00)
GFR calc non Af Amer: >60 mL/min (ref >60)
Glucose, Bld: 117 mg/dL — ABNORMAL HIGH (ref 65–99)
Potassium: 4.3 mmol/L (ref 3.5–5.1)
SODIUM: 137 mmol/L (ref 135–145)
Total Bilirubin: 0.8 mg/dL (ref 0.3–1.2)
Total Protein: 4.8 g/dL — ABNORMAL LOW (ref 6.5–8.1)

## 2016-12-24 LAB — CBC
HCT: 26.3 % — ABNORMAL LOW (ref 36.0–46.0)
Hemoglobin: 8.6 g/dL — ABNORMAL LOW (ref 12.0–15.0)
MCH: 30.8 pg (ref 26.0–34.0)
MCHC: 32.7 g/dL (ref 30.0–36.0)
MCV: 94.3 fL (ref 78.0–100.0)
PLATELETS: 321 10*3/uL (ref 150–400)
RBC: 2.79 MIL/uL — ABNORMAL LOW (ref 3.87–5.11)
RDW: 14.7 % (ref 11.5–15.5)
WBC: 7.6 10*3/uL (ref 4.0–10.5)

## 2016-12-24 LAB — PROTIME-INR
INR: 1.41
Prothrombin Time: 17.4 seconds — ABNORMAL HIGH (ref 11.4–15.2)

## 2016-12-24 NOTE — Progress Notes (Signed)
PROGRESS NOTE    Jody Moore  ZDG:[id number] DOB: 1941/01/03 DOA: 12/21/2016 PCP: Gildardo Cranker, DO     Brief Narrative:  76 y.o. female H/o osteoporosis, depression present with fall from home. CT right leg with Impacted transverse fracture of the metaphysis of the distal right femur S/P surgical correction .  Assessment & Plan:   1-S/P open reduction internal fixation of distal R femoral Fracture : Continue pain control and DVT prophylaxis PT OT nonweightbearing , NO BENDING . 2-Acute compression fracture involving the superior endplate of V95: per MRI (associated 50% height loss with trace 2 mm bony retropulsion. No significant stenosis. This is benign/osteoporotic in appearance.), Continue pain control and vit-D/Calcium supplementation. 3-Hx of osteoporosis : Continue vitamin D calcium supplementation. 4 history of depression : Continue Paxil. DVT prophylaxis with Lovenox .  Hopefully discharge tomorrow to SNF .   DVT prophylaxis: (Lovenox) Code Status: (Full) Family Communication: (Daughter at bedside) Disposition Plan: (Skilled nursing facility)   Consultants:   ortho  Procedures:    Antimicrobials: (specify start and planned stop date. Auto populated tables are space occupying and do not give end dates)  None      Subjective:   Looks very comfortable , no issues or complaints , denies any pain or SOB .  Objective: Vitals:   12/23/16 1528 12/23/16 2250 12/24/16 0300 12/24/16 1455  BP: (!) 125/49 (!) 108/48 (!) 130/57 (!) 107/50  Pulse: 85 82 73 88  Resp: 16 17 17 16   Temp: 98.2 F (36.8 C) 98.7 F (37.1 C) 98.3 F (36.8 C) 99 F (37.2 C)  TempSrc: Oral Oral Oral Oral  SpO2: 96% 92% 99% 93%  Weight:      Height:        Intake/Output Summary (Last 24 hours) at 12/24/16 1620 Last data filed at 12/24/16 1300  Gross per 24 hour  Intake             1060 ml  Output              100 ml  Net              960 ml   Filed Weights   12/21/16  1124  Weight: 46.7 kg (103 lb)    Examination:  General exam: NAD. Respiratory system: Clear to auscultation. Respiratory effort normal. Cardiovascular system: RRR,S1S2. Gastrointestinal system: Abdomen is nondistended, soft and nontender. No organomegaly or masses felt. Normal bowel sounds heard. Central nervous system: Alert and oriented. No focal neurological deficits. Extremities: Symmetric 5 x 5 power. Skin: No rashes, lesions or ulcers Psychiatry: Judgement and insight appear normal. Mood & affect appropriate.     Data Reviewed: I have personally reviewed following labs and imaging studies  CBC:  Recent Labs Lab 12/21/16 1232 12/21/16 1854 12/22/16 0546 12/23/16 0253 12/24/16 0414  WBC 12.5* 10.8* 6.5 9.7 7.6  NEUTROABS  --   --  3.7 9.0*  --   HGB 10.2* 9.7* 9.0* 9.6* 8.6*  HCT 30.7* 29.4* 27.1* 29.6* 26.3*  MCV 94.5 95.1 93.1 95.5 94.3  PLT 327 319 291 297 638   Basic Metabolic Panel:  Recent Labs Lab 12/21/16 1232 12/21/16 1854 12/22/16 0546 12/23/16 0253 12/24/16 0414  NA 138  --  139 137 137  K 3.4*  --  3.3* 5.5* 4.3  CL 105  --  107 107 108  CO2 22  --  23 18* 21*  GLUCOSE 109*  --  101* 135* 117*  BUN 20  --  16 11 9   CREATININE 0.71 0.69 0.59 0.70 0.49  CALCIUM 8.6*  --  8.5* 8.2* 8.4*  MG  --   --  1.6*  --   --    GFR: Estimated Creatinine Clearance: 44.8 mL/min (by C-G formula based on SCr of 0.49 mg/dL). Liver Function Tests:  Recent Labs Lab 12/21/16 1232 12/22/16 0546 12/24/16 0414  AST 71* 55* 45*  ALT 30 28 11*  ALKPHOS 73 60 53  BILITOT 0.8 1.0 0.8  PROT 6.6 5.6* 4.8*  ALBUMIN 3.2* 2.6* 2.3*   No results for input(s): LIPASE, AMYLASE in the last 168 hours. No results for input(s): AMMONIA in the last 168 hours. Coagulation Profile:  Recent Labs Lab 12/22/16 0546 12/23/16 0253 12/24/16 0414  INR 1.20 1.24 1.41   Cardiac Enzymes:  Recent Labs Lab 12/22/16 0546  CKTOTAL 520*   BNP (last 3 results) No results  for input(s): PROBNP in the last 8760 hours. HbA1C: No results for input(s): HGBA1C in the last 72 hours. CBG:  Recent Labs Lab 12/23/16 2249  GLUCAP 115*   Lipid Profile: No results for input(s): CHOL, HDL, LDLCALC, TRIG, CHOLHDL, LDLDIRECT in the last 72 hours. Thyroid Function Tests:  Recent Labs  12/23/16 0253  TSH 1.072   Anemia Panel:  Recent Labs  12/23/16 0253  VITAMINB12 106*  FOLATE 9.8  TIBC 193*  IRON 12*  RETICCTPCT 1.5   Urine analysis:    Component Value Date/Time   COLORURINE YELLOW 12/21/2016 1414   APPEARANCEUR HAZY (A) 12/21/2016 1414   LABSPEC 1.021 12/21/2016 1414   PHURINE 5.0 12/21/2016 1414   GLUCOSEU NEGATIVE 12/21/2016 1414   HGBUR MODERATE (A) 12/21/2016 1414   BILIRUBINUR NEGATIVE 12/21/2016 1414   KETONESUR 80 (A) 12/21/2016 1414   PROTEINUR 30 (A) 12/21/2016 1414   NITRITE NEGATIVE 12/21/2016 1414   LEUKOCYTESUR NEGATIVE 12/21/2016 1414   Sepsis Labs: @LABRCNTIP (procalcitonin:4,lacticidven:4)  ) Recent Results (from the past 240 hour(s))  Surgical pcr screen     Status: None   Collection Time: 12/22/16 11:15 AM  Result Value Ref Range Status   MRSA, PCR NEGATIVE NEGATIVE Final   Staphylococcus aureus NEGATIVE NEGATIVE Final    Comment:        The Xpert SA Assay (FDA approved for NASAL specimens in patients over 36 years of age), is one component of a comprehensive surveillance program.  Test performance has been validated by [facility] for patients greater than or equal to 71 year old. It is not intended to diagnose infection nor to guide or monitor treatment.          Radiology Studies: Dg Knee 1-2 Views Right  Result Date: 12/24/2016 CLINICAL DATA:  Distal femur fracture.  Postoperative redness. EXAM: RIGHT KNEE - 1-2 VIEW COMPARISON:  Radiography from 3 days prior. Fluoroscopy from 2 days ago. FINDINGS: Interval lateral plate and screw fixation of a distal femur fracture involving metaphysis and  intercondylar region. Bone cement was placed at the metaphysis. No new/operative related fracture is noted. Hardware appears well seated. Normal alignment. Nonspecific soft tissue swelling and joint gas after recent surgery. Osteopenia. IMPRESSION: Stable postoperative changes.  No acute superimposed finding. Electronically Signed   By: Monte Fantasia M.D.   On: 12/24/2016 09:12   Pelvis Portable  Result Date: 12/22/2016 CLINICAL DATA:  Recent ORIF of right femoral fracture EXAM: PORTABLE PELVIS 1-2 VIEWS COMPARISON:  02/21/2017 FINDINGS: Postsurgical changes are again noted in the proximal right femur stable from the prior exam. Pelvic ring  is intact. Degenerative changes of the lumbar spine are noted. No soft tissue changes are seen. IMPRESSION: Chronic changes in the pelvis.  No acute abnormality noted. Electronically Signed   By: Inez Catalina M.D.   On: 12/22/2016 21:34   Dg C-arm Gt 120 Min  Result Date: 12/22/2016 CLINICAL DATA:  ORIF of right femoral fracture EXAM: RIGHT FEMUR 2 VIEWS; DG C-ARM GT 120 MIN COMPARISON:  12/21/2016 FLUOROSCOPY TIME:  Fluoroscopy Time:  1 minutes 31 seconds Radiation Exposure Index (if provided by the fluoroscopic device): Not available Number of Acquired Spot Images: Sex FINDINGS: Fixation sideplate is noted along the distal femur with multiple fixation screws. Additionally the previously seen medullary rod remains in place. Multiple cerclage wires are noted as well. Fracture fragments are in near anatomic alignment. Removal of the previously placed screws in the lateral femoral condyles is noted. Bone cement is noted in the distal fracture site. IMPRESSION: ORIF of distal right femoral fracture Electronically Signed   By: Inez Catalina M.D.   On: 12/22/2016 21:21   Dg Femur, Min 2 Views Right  Result Date: 12/22/2016 CLINICAL DATA:  ORIF of right femoral fracture EXAM: RIGHT FEMUR 2 VIEWS; DG C-ARM GT 120 MIN COMPARISON:  12/21/2016 FLUOROSCOPY TIME:  Fluoroscopy  Time:  1 minutes 31 seconds Radiation Exposure Index (if provided by the fluoroscopic device): Not available Number of Acquired Spot Images: Sex FINDINGS: Fixation sideplate is noted along the distal femur with multiple fixation screws. Additionally the previously seen medullary rod remains in place. Multiple cerclage wires are noted as well. Fracture fragments are in near anatomic alignment. Removal of the previously placed screws in the lateral femoral condyles is noted. Bone cement is noted in the distal fracture site. IMPRESSION: ORIF of distal right femoral fracture Electronically Signed   By: Inez Catalina M.D.   On: 12/22/2016 21:21        Scheduled Meds: . calcium-vitamin D  1 tablet Oral BID  . enoxaparin (LOVENOX) injection  30 mg Subcutaneous Q24H  . feeding supplement (ENSURE ENLIVE)  237 mL Oral BID BM  . PARoxetine  40 mg Oral Daily  . polyethylene glycol  17 g Oral Daily  . senna-docusate  1 tablet Oral BID   Continuous Infusions: . 0.9 % NaCl with KCl 20 mEq / L 75 mL/hr at 12/22/16 2323  . lactated ringers Stopped (12/23/16 0413)  . methocarbamol (ROBAXIN)  IV       LOS: 3 days    Time spent: [place], MD [facility] Pager [phone]  If 7PM-7AM, please contact night-coverage [email or web address] Password [email or web address] 12/24/2016, 4:20 PM

## 2016-12-24 NOTE — Progress Notes (Signed)
Pt is calm as at 2000 yesterday, step daughter was concerned why knee immobilizer was off at surgery site upon arrival, reasons and explanation were given based on reports gathered from day shift nurse, new immobilizer ordered and applied, nurse witness Pt. Trying to take the new immobilizer out. Education given to pt,  Denies pain, no distress noted as at now,call light within reach, will continue to monitor.

## 2016-12-24 NOTE — Progress Notes (Signed)
Subjective: Pt stable - pain ok this am   Objective: Vital signs in last 24 hours: Temp:  [97.7 F (36.5 C)-98.7 F (37.1 C)] 98.3 F (36.8 C) (07/19 0300) Pulse Rate:  [73-85] 73 (07/19 0300) Resp:  [16-18] 17 (07/19 0300) BP: (108-130)/(40-57) 130/57 (07/19 0300) SpO2:  [92 %-99 %] 99 % (07/19 0300)  Intake/Output from previous day: 07/18 0701 - 07/19 0700 In: 970 [P.O.:970] Out: 350 [Urine:350] Intake/Output this shift: No intake/output data recorded.  Exam:  Compartment soft  Labs:  Recent Labs  12/21/16 1232 12/21/16 1854 12/22/16 0546 12/23/16 0253 12/24/16 0414  HGB 10.2* 9.7* 9.0* 9.6* 8.6*    Recent Labs  12/23/16 0253 12/24/16 0414  WBC 9.7 7.6  RBC 3.10*  3.10* 2.79*  HCT 29.6* 26.3*  PLT 297 321    Recent Labs  12/23/16 0253 12/24/16 0414  NA 137 137  K 5.5* 4.3  CL 107 108  CO2 18* 21*  BUN 11 9  CREATININE 0.70 0.49  GLUCOSE 135* 117*  CALCIUM 8.2* 8.4*    Recent Labs  12/23/16 0253 12/24/16 0414  INR 1.24 1.41    Assessment/Plan: Plan for nwb today - ok to be out of knee immobilizer in bed only - nwb strict otherwise - dressing ok snf tomorrow   Anderson Malta 12/24/2016, 7:47 AM

## 2016-12-24 NOTE — Progress Notes (Signed)
Pt noted to pull out RUA PIV.  And per family pt was sticking silverware from tray under right knee immobilizer.  No injury noted upon assessment, all staff and charge nurse aware. Pt's daughter would like pt to have a Air cabin crew when family is not at bedside if possible.  AKingRN

## 2016-12-24 NOTE — Social Work (Signed)
CSW spoke with daughter [name] at [phone] to discuss offers.   CSw left list of SNF's offers for daughter in the patient's room. CSW will f/u with her tomorrow to select a SNF for DC.  Elissa Hefty, LCSW Clinical Social Worker [phone]

## 2016-12-24 NOTE — NC FL2 (Signed)
[place] LEVEL OF CARE SCREENING TOOL     IDENTIFICATION  Patient Name: Jody Moore Birthdate: 08/30/1940 Sex: female Admission Date (Current Location): 12/21/2016  [facility] and [place] Number:  Herbalist and Address:  [facility] [place], [place] [address] [place], [place] [address]      Provider Number: [id number]  Attending Physician Name and Address:  Waldron Session, MD  Relative Name and Phone Number:       Current Level of Care: Hospital Recommended Level of Care: [place] Prior Approval Number:    Date Approved/Denied: 12/23/16 PASRR Number: [id number]  Discharge Plan: SNF    Current Diagnoses: Patient Active Problem List   Diagnosis Date Noted  . Femur fracture ([place]) 12/22/2016  . Femur fracture, right ([place]) 12/21/2016  . Prediabetes 07/29/2016  . Situational anxiety 07/29/2016  . Gastroesophageal reflux disease without esophagitis 07/10/2015  . Severe episode of recurrent major depressive disorder, without psychotic features ([place]) 07/10/2015  . Osteoporosis 07/10/2015  . 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 Essential tremor 10/04/2013  . 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    Orientation RESPIRATION BLADDER Height & Weight     Self, Situation  Normal Incontinent Weight: 103 lb (46.7 kg) Height:  5\' 3"  (160 cm)  BEHAVIORAL SYMPTOMS/MOOD NEUROLOGICAL BOWEL NUTRITION STATUS      Continent Diet (See DC Summary)  AMBULATORY STATUS COMMUNICATION OF NEEDS Skin   Extensive Assist Verbally Surgical wounds (Right Knee Closed Incision with compression wrap)                       Personal Care Assistance Level of Assistance  Bathing, Feeding, Dressing Bathing Assistance: Maximum assistance Feeding assistance: Limited assistance Dressing Assistance: Maximum assistance     Functional Limitations Info             SPECIAL CARE FACTORS FREQUENCY                       Contractures      Additional Factors Info  Code Status, Allergies, Psychotropic Code Status Info: Full Code Allergies Info: INFLUENZA VACCINES, OMEPRAZOLE, COUMADIN WARFARIN SODIUM  Psychotropic Info: Paxil         Current Medications (12/24/2016):  This is the current hospital active medication list Current Facility-Administered Medications  Medication Dose Route Frequency Provider Last Rate Last Dose  . 0.9 % NaCl with KCl 20 mEq/ L  infusion   Intravenous Continuous [name], MD 75 mL/hr at 12/22/16 2323    . acetaminophen (TYLENOL) tablet 650 mg  650 mg Oral Q6H PRN [name], MD   650 mg at 12/23/16 1115   Or  . acetaminophen (TYLENOL) suppository 650 mg  650 mg Rectal Q6H PRN [name], MD      . calcium-vitamin D (OSCAL WITH D) 500-200 MG-UNIT per tablet 1 tablet  1 tablet Oral BID Waldron Session, MD   1 tablet at 12/23/16 2228  . enoxaparin (LOVENOX) injection 30 mg  30 mg Subcutaneous Q24H Jaquita Folds, RPH   30 mg at 12/23/16 2228  . feeding supplement (ENSURE ENLIVE) (ENSURE ENLIVE) liquid 237 mL  237 mL Oral BID BM Florencia Reasons, MD   237 mL at 12/23/16 1400  . lactated ringers infusion   Intravenous Continuous Ell[place], Karyl Kinnier, MD   Stopped at 12/23/16 0413  . menthol-cetylpyridinium (CEPACOL) lozenge 3 mg  1 lozenge Oral PRN  [name], MD       Or  . phenol [facility]) mouth spray 1 spray  1 spray Mouth/Throat PRN [name], MD      . methocarbamol (ROBAXIN) tablet 500 mg  500 mg Oral Q6H PRN [name], MD   500 mg at 12/24/16 0646   Or  . methocarbamol (ROBAXIN) 500 mg in dextrose 5 % 50 mL IVPB  500 mg Intravenous Q6H PRN [name], MD      . metoCLOPramide (REGLAN) tablet 5-10 mg  5-10 mg Oral Q8H PRN [name], MD       Or  . metoCLOPramide (REGLAN) injection 5-10 mg  5-10 mg Intravenous Q8H PRN [name], MD      . morphine 4 MG/ML injection 1 mg  1 mg Intravenous Q3H PRN  Florencia Reasons, MD      . ondansetron [facility]) tablet 4 mg  4 mg Oral Q6H PRN [name], MD       Or  . ondansetron [facility]) injection 4 mg  4 mg Intravenous Q6H PRN [name], MD      . oxyCODONE-acetaminophen (PERCOCET/ROXICET) 5-325 MG per tablet 1 tablet  1 tablet Oral Q4H PRN Florencia Reasons, MD   1 tablet at 12/24/16 [id number]  . PARoxetine (PAXIL) tablet 40 mg  40 mg Oral Daily Florencia Reasons, MD   40 mg at 12/23/16 1012  . polyethylene glycol (MIRALAX / GLYCOLAX) packet 17 g  17 g Oral Daily Florencia Reasons, MD   17 g at 12/23/16 1052  . senna-docusate (Senokot-S) tablet 1 tablet  1 tablet Oral BID Florencia Reasons, MD   1 tablet at 12/23/16 1048     Discharge Medications: Please see discharge summary for a list of discharge medications.  Relevant Imaging Results:  Relevant Lab Results:   Additional Information SS: [id number] [place], LCSW

## 2016-12-25 ENCOUNTER — Ambulatory Visit: Payer: Medicare Other

## 2016-12-25 DIAGNOSIS — Z419 Encounter for procedure for purposes other than remedying health state, unspecified: Secondary | ICD-10-CM | POA: Diagnosis not present

## 2016-12-25 DIAGNOSIS — Z79899 Other long term (current) drug therapy: Secondary | ICD-10-CM | POA: Diagnosis not present

## 2016-12-25 DIAGNOSIS — S72491G Other fracture of lower end of right femur, subsequent encounter for closed fracture with delayed healing: Secondary | ICD-10-CM | POA: Diagnosis not present

## 2016-12-25 DIAGNOSIS — R4189 Other symptoms and signs involving cognitive functions and awareness: Secondary | ICD-10-CM | POA: Diagnosis not present

## 2016-12-25 DIAGNOSIS — W0110XA Fall on same level from slipping, tripping and stumbling with subsequent striking against unspecified object, initial encounter: Secondary | ICD-10-CM | POA: Diagnosis not present

## 2016-12-25 DIAGNOSIS — S22080D Wedge compression fracture of T11-T12 vertebra, subsequent encounter for fracture with routine healing: Secondary | ICD-10-CM | POA: Diagnosis not present

## 2016-12-25 DIAGNOSIS — Z23 Encounter for immunization: Secondary | ICD-10-CM | POA: Diagnosis not present

## 2016-12-25 DIAGNOSIS — E43 Unspecified severe protein-calorie malnutrition: Secondary | ICD-10-CM | POA: Diagnosis not present

## 2016-12-25 DIAGNOSIS — M25561 Pain in right knee: Secondary | ICD-10-CM | POA: Diagnosis not present

## 2016-12-25 DIAGNOSIS — S72401A Unspecified fracture of lower end of right femur, initial encounter for closed fracture: Secondary | ICD-10-CM | POA: Diagnosis not present

## 2016-12-25 DIAGNOSIS — D62 Acute posthemorrhagic anemia: Secondary | ICD-10-CM | POA: Diagnosis not present

## 2016-12-25 DIAGNOSIS — S72401D Unspecified fracture of lower end of right femur, subsequent encounter for closed fracture with routine healing: Secondary | ICD-10-CM | POA: Diagnosis not present

## 2016-12-25 DIAGNOSIS — R1311 Dysphagia, oral phase: Secondary | ICD-10-CM | POA: Diagnosis not present

## 2016-12-25 DIAGNOSIS — M549 Dorsalgia, unspecified: Secondary | ICD-10-CM | POA: Diagnosis not present

## 2016-12-25 DIAGNOSIS — S728X1D Other fracture of right femur, subsequent encounter for closed fracture with routine healing: Secondary | ICD-10-CM | POA: Diagnosis not present

## 2016-12-25 DIAGNOSIS — S728X9A Other fracture of unspecified femur, initial encounter for closed fracture: Secondary | ICD-10-CM | POA: Diagnosis not present

## 2016-12-25 DIAGNOSIS — Z9181 History of falling: Secondary | ICD-10-CM | POA: Diagnosis not present

## 2016-12-25 DIAGNOSIS — M4850XA Collapsed vertebra, not elsewhere classified, site unspecified, initial encounter for fracture: Secondary | ICD-10-CM

## 2016-12-25 DIAGNOSIS — R488 Other symbolic dysfunctions: Secondary | ICD-10-CM | POA: Diagnosis not present

## 2016-12-25 DIAGNOSIS — S72491D Other fracture of lower end of right femur, subsequent encounter for closed fracture with routine healing: Secondary | ICD-10-CM | POA: Diagnosis not present

## 2016-12-25 DIAGNOSIS — S72391A Other fracture of shaft of right femur, initial encounter for closed fracture: Secondary | ICD-10-CM | POA: Diagnosis not present

## 2016-12-25 DIAGNOSIS — M6281 Muscle weakness (generalized): Secondary | ICD-10-CM | POA: Diagnosis not present

## 2016-12-25 DIAGNOSIS — G8911 Acute pain due to trauma: Secondary | ICD-10-CM | POA: Diagnosis not present

## 2016-12-25 DIAGNOSIS — M8000XD Age-related osteoporosis with current pathological fracture, unspecified site, subsequent encounter for fracture with routine healing: Secondary | ICD-10-CM | POA: Diagnosis not present

## 2016-12-25 DIAGNOSIS — I10 Essential (primary) hypertension: Secondary | ICD-10-CM | POA: Diagnosis not present

## 2016-12-25 DIAGNOSIS — S22080A Wedge compression fracture of T11-T12 vertebra, initial encounter for closed fracture: Secondary | ICD-10-CM | POA: Diagnosis not present

## 2016-12-25 DIAGNOSIS — G250 Essential tremor: Secondary | ICD-10-CM | POA: Diagnosis not present

## 2016-12-25 LAB — CBC
HCT: 26.8 % — ABNORMAL LOW (ref 36.0–46.0)
Hemoglobin: 8.8 g/dL — ABNORMAL LOW (ref 12.0–15.0)
MCH: 30.9 pg (ref 26.0–34.0)
MCHC: 32.8 g/dL (ref 30.0–36.0)
MCV: 94 fL (ref 78.0–100.0)
Platelets: 398 K/uL (ref 150–400)
RBC: 2.85 MIL/uL — ABNORMAL LOW (ref 3.87–5.11)
RDW: 14.5 % (ref 11.5–15.5)
WBC: 8.1 K/uL (ref 4.0–10.5)

## 2016-12-25 LAB — PROTIME-INR
INR: 1.21
Prothrombin Time: 15.4 seconds — ABNORMAL HIGH (ref 11.4–15.2)

## 2016-12-25 MED ORDER — MENTHOL 3 MG MT LOZG: 1.0000 | LOZENGE | OROMUCOSAL | 12 refills | Status: DC | PRN

## 2016-12-25 MED ORDER — ACETAMINOPHEN-CODEINE #3 300-30 MG PO TABS: 1.0000 | ORAL_TABLET | Freq: Four times a day (QID) | ORAL | 0 refills | Status: DC | PRN

## 2016-12-25 MED ORDER — ENSURE ENLIVE PO LIQD: 237.0000 mL | Freq: Two times a day (BID) | ORAL | 12 refills | Status: DC

## 2016-12-25 MED ORDER — ENOXAPARIN SODIUM 30 MG/0.3ML ~~LOC~~ SOLN: 30.0000 mg | SUBCUTANEOUS | 1 refills | Status: DC

## 2016-12-25 NOTE — Social Work (Signed)
CSW f/u with family and they have accepted bed offer at [facility].  CSW contacted admissions and confirmed bed acceptance. Nikki (admission) advised that patient daughter can call and discuss a time for family to complete paperwork.  CSW will continue to follow.  Elissa Hefty, LCSW Clinical Social Worker [phone]

## 2016-12-25 NOTE — Discharge Instructions (Signed)
Nonweightbearing right lower extremity Okay to shower with dressing in place but maintain nonweightbearing Okay to not be in the knee immobilizer when in bed but out of bed for transfers use the knee immobilizer to keep the knee from bending Okay for hip range of motion on the right and ankle range of motion.  Hold off on knee range of motion for the first 2 weeks after surgery Follow-up in 10 days for incisional check and x-rays

## 2016-12-25 NOTE — Social Work (Signed)
CSW called daughter and discussed dc today and the need for family to select a SNF.  Daughter indicated that she was not aware that patient was DC today and will f/u with selecting a SNF. CSW advised the importance of selecting a SNF expeditiously. CSW will continue to follow.  Elissa Hefty, LCSW Clinical Social Worker [phone]

## 2016-12-25 NOTE — Clinical Social Work Placement (Signed)
   CLINICAL SOCIAL WORK PLACEMENT  NOTE  Date:  12/25/2016  Patient Details  Name: Jody Moore MRN: [id number] Date of Birth: 04-Dec-1940  Clinical Social Work is seeking post-discharge placement for this patient at the [place] level of care (*CSW will initial, date and re-position this form in  chart as items are completed):  Yes   Patient/family provided with [facility] Work Department's list of facilities offering this level of care within the geographic area requested by the patient (or if unable, by the patient's family).  Yes   Patient/family informed of their freedom to choose among providers that offer the needed level of care, that participate in Medicare, Medicaid or managed care program needed by the patient, have an available bed and are willing to accept the patient.  Yes   Patient/family informed of [facility]'s ownership interest in [facility] and [facility], as well as of the fact that they are under no obligation to receive care at these facilities.  PASRR submitted to EDS on       PASRR number received on 12/23/16     Existing PASRR number confirmed on       FL2 transmitted to all facilities in geographic area requested by pt/family on 12/23/16     FL2 transmitted to all facilities within larger geographic area on 12/23/16     Patient informed that his/her managed care company has contracts with or will negotiate with certain facilities, including the following:        Yes   Patient/family informed of bed offers received.  Patient chooses bed at [facility]     Physician recommends and patient chooses bed at      Patient to be transferred to [facility] on 12/25/16.  Patient to be transferred to facility by [facility]     Patient family notified on 12/25/16 of transfer.  Name of family member notified:  [name], step daughter     PHYSICIAN Please prepare priority discharge  summary, including medications, Please prepare prescriptions, Please sign FL2     Additional Comment:    _______________________________________________ Normajean Baxter, LCSW 12/25/2016, 11:03 AM

## 2016-12-25 NOTE — Progress Notes (Addendum)
Subjective: Pt stable - xray from yesterday ok   Objective: Vital signs in last 24 hours: Temp:  [98.4 F (36.9 C)-99.1 F (37.3 C)] 98.4 F (36.9 C) (07/20 0300) Pulse Rate:  [76-88] 79 (07/20 0300) Resp:  [16] 16 (07/20 0300) BP: (107-147)/(50-66) 147/66 (07/20 0300) SpO2:  [93 %-95 %] 93 % (07/20 0300)  Intake/Output from previous day: 07/19 0701 - 07/20 0700 In: 360 [P.O.:360] Out: -  Intake/Output this shift: No intake/output data recorded.  Exam:  Compartment soft  Labs:  Recent Labs  12/23/16 0253 12/24/16 0414 12/25/16 0309  HGB 9.6* 8.6* 8.8*    Recent Labs  12/24/16 0414 12/25/16 0309  WBC 7.6 8.1  RBC 2.79* 2.85*  HCT 26.3* 26.8*  PLT 321 398    Recent Labs  12/23/16 0253 12/24/16 0414  NA 137 137  K 5.5* 4.3  CL 107 108  CO2 18* 21*  BUN 11 9  CREATININE 0.70 0.49  GLUCOSE 135* 117*  CALCIUM 8.2* 8.4*    Recent Labs  12/24/16 0414 12/25/16 0309  INR 1.41 1.21    Assessment/Plan: Plan snf today - rx on chart - Plan to hold aspirin and instead use Lovenox for DVT prophylaxis for the next month   [employer] 12/25/2016, 6:44 AM

## 2016-12-25 NOTE — Progress Notes (Signed)
Pt to discharge to [facility], report called to [facility].  [email or web address]

## 2016-12-25 NOTE — Care Management Important Message (Signed)
Important Message  Patient Details  Name: Jody Moore MRN: [id number] Date of Birth: 02/21/1941   Medicare Important Message Given:  Yes    [name] May 12/25/2016, 10:41 AM

## 2016-12-25 NOTE — Social Work (Signed)
Clinical Social Worker facilitated patient discharge including contacting patient family and facility to confirm patient discharge plans.  Clinical information faxed to facility and family agreeable with plan.    CSW arranged ambulance transport via [facility] to [facility] at  2:00pm.    RN to call [phone] to give report prior to discharge.  Clinical Social Worker will sign off for now as social work intervention is no longer needed. Please consult [place] again if new need arises.  Elissa Hefty, LCSW Clinical Social Worker [phone]

## 2016-12-25 NOTE — Discharge Summary (Signed)
Physician Discharge Summary  CLOTILDA HAFER UJW:[id number] DOB: [birth year] DOA: 12/21/2016  PCP: Gildardo Cranker, DO  Admit date: 12/21/2016 Discharge date: 12/25/2016  Admitted From: (Home) Disposition:  (SNF)  Recommendations for Outpatient Follow-up:  1. Follow up with ORTHO in 6-8  Weeks . 2. IR follow up in 2 -4 weeks for compression fracture.   Home Health:(NO)  Equipment/Devices:None  Discharge Condition: (Stable)  CODE STATUS: (FULL)  Diet recommendation: Heart Healthy   Brief/Interim Summary:  76 y.o. female H/o osteoporosis, depression present with fall from home. CT right leg with Impacted transverse fracture of the metaphysis of the distal right femur S/P ORIF that restored the alignment and bone stock for possible later surgery in few weeks per ortho recommendations . She would be nonweightbearing for a period of at least 6-8 weeks.  Supplemental bone graft will be required later per ortho . Patient will be discharged to SNF .  Discharge Diagnoses:    1-S/P open reduction internal fixation of distal R femoral Fracture : Continue pain control , DVT prophylaxis by lovenox , PT OT nonweightbearing , NO BENDING , discharge to SNF. 2-Acute compression fracture involving the superior endplate of F62: per MRI (associated 50% height loss with trace 2 mm bony retropulsion. No significant stenosis. This is benign/osteoporotic in appearance.), Continue pain control and vit-D/Calcium supplementation,IR outpatient follow up in 2-4 weeks . 3-Hx of osteoporosis : Continue vitamin D calcium supplementation. 4 history of depression : Continue Paxil.  Discharge Instructions  Discharge Instructions    Diet - low sodium heart healthy    Complete by:  As directed    Increase activity slowly    Complete by:  As directed      Allergies as of 12/25/2016      Reactions   Influenza Vaccines Hives   Hives/swelling/injection site war to the touch. No relief with benadryl     Omeprazole Hives   Coumadin [warfarin Sodium] Rash      Medication List    STOP taking these medications   aspirin 325 MG EC tablet     TAKE these medications   acetaminophen-codeine 300-30 MG tablet Commonly known as:  TYLENOL #3 Take 1 tablet by mouth every 6 (six) hours as needed for moderate pain.   atenolol 25 MG tablet Commonly known as:  TENORMIN Take 0.5 tablets (12.5 mg total) by mouth daily.   CALCIUM + D PO Take 1 tablet by mouth daily.   enoxaparin 30 MG/0.3ML injection Commonly known as:  LOVENOX Inject 0.3 mLs (30 mg total) into the skin daily.   feeding supplement (ENSURE ENLIVE) Liqd Take 237 mLs by mouth 2 (two) times daily between meals.   menthol-cetylpyridinium 3 MG lozenge Commonly known as:  CEPACOL Take 1 lozenge (3 mg total) by mouth as needed for sore throat (sore throat).   multivitamin tablet Take 1 tablet by mouth daily.   PARoxetine 40 MG tablet Commonly known as:  PAXIL TAKE 1 TABLET(40 MG) BY MOUTH DAILY      Follow-up Information    Hoss, Arthur, MD Follow up in 4 week(s).   Specialty:  Interventional Radiology Why:  for vertebral compression fracture Contact information: [place] [address] [phone]        Gildardo Cranker, DO Follow up.   Specialty:  Internal Medicine Contact information: [place] [id number] [phone]          Allergies  Allergen Reactions  . Influenza Vaccines Hives  Hives/swelling/injection site war to the touch. No relief with benadryl   . Omeprazole Hives  . Coumadin [Warfarin Sodium] Rash    Consultations:  ORTHO   Procedures/Studies: Dg Chest 2 View  Result Date: 12/21/2016 CLINICAL DATA:  [place] 3 days ago.  No reported chest symptoms. EXAM: CHEST  2 VIEW COMPARISON:  08/13/2005. FINDINGS: Normal sized heart. Mild linear density at both lung bases. Approximately 50% lower thoracic vertebral compression deformity. No visible  acute fracture lines. No previous lateral view for comparison. IMPRESSION: 1. Approximately 50% lower thoracic vertebral compression deformity, age indeterminate. 2. Mild bibasilar linear atelectasis or scarring. Electronically Signed   By: Claudie Revering M.D.   On: 12/21/2016 13:26   Dg Knee 1-2 Views Right  Result Date: 12/24/2016 CLINICAL DATA:  Distal femur fracture.  Postoperative redness. EXAM: RIGHT KNEE - 1-2 VIEW COMPARISON:  Radiography from 3 days prior. Fluoroscopy from 2 days ago. FINDINGS: Interval lateral plate and screw fixation of a distal femur fracture involving metaphysis and intercondylar region. Bone cement was placed at the metaphysis. No new/operative related fracture is noted. Hardware appears well seated. Normal alignment. Nonspecific soft tissue swelling and joint gas after recent surgery. Osteopenia. IMPRESSION: Stable postoperative changes.  No acute superimposed finding. Electronically Signed   By: Monte Fantasia M.D.   On: 12/24/2016 09:12   Dg Ankle 2 Views Right  Result Date: 12/21/2016 CLINICAL DATA:  Remote right ankle fracture.  Ankle now on stable. EXAM: RIGHT ANKLE - 2 VIEW COMPARISON:  None. FINDINGS: Two views study shows plate and screw fixation of the distal tibia with persistent apex posterior angulation. Degenerative changes are noted at the tibiotalar joint. Relationship of the distal fibula to the ankle mortise not well demonstrated on this study. Bones are diffusely demineralized. IMPRESSION: 1. Posttraumatic deformity of the distal tibia with evidence of ORIF. 2. Osteoarthritis of the tibiotalar joint. Electronically Signed   By: Misty Stanley M.D.   On: 12/21/2016 18:54   Ct Head Wo Contrast  Result Date: 12/21/2016 CLINICAL DATA:  Fall 3 days ago EXAM: CT HEAD WITHOUT CONTRAST CT CERVICAL SPINE WITHOUT CONTRAST TECHNIQUE: Multidetector CT imaging of the head and cervical spine was performed following the standard protocol without intravenous contrast.  Multiplanar CT image reconstructions of the cervical spine were also generated. COMPARISON:  None. FINDINGS: CT HEAD FINDINGS Brain: There are mild chronic ischemic changes in the periventricular white matter. No mass effect, midline shift, or acute hemorrhage. Mild global atrophy appropriate to age. Vascular: No hyperdense vessel or unexpected calcification. Skull: Cranium is intact. Sinuses/Orbits: Visualized paranasal sinuses and mastoid air cells are clear. Other: Noncontributory CT CERVICAL SPINE FINDINGS Alignment: There is anterolisthesis C4 upon C5 which is related to facet arthropathy. There is reversed cervical lordosis. Skull base and vertebrae: No acute fracture.  No dislocation. Soft tissues and spinal canal: No evidence of spinal hematoma or soft tissue injury. Disc levels: There is severe narrowing of the C5-6 and C6-7 discs with posterior osteophytic ridging. Spinal canal is grossly patent. Upper chest: Emphysema.  No lung mass. Other: Thyroid is unremarkable. IMPRESSION: No acute intracranial pathology. No evidence of cervical spine injury. Chronic changes. Electronically Signed   By: Marybelle Killings M.D.   On: 12/21/2016 13:39   Ct Cervical Spine Wo Contrast  Result Date: 12/21/2016 CLINICAL DATA:  Fall 3 days ago EXAM: CT HEAD WITHOUT CONTRAST CT CERVICAL SPINE WITHOUT CONTRAST TECHNIQUE: Multidetector CT imaging of the head and cervical spine was performed following the standard protocol  without intravenous contrast. Multiplanar CT image reconstructions of the cervical spine were also generated. COMPARISON:  None. FINDINGS: CT HEAD FINDINGS Brain: There are mild chronic ischemic changes in the periventricular white matter. No mass effect, midline shift, or acute hemorrhage. Mild global atrophy appropriate to age. Vascular: No hyperdense vessel or unexpected calcification. Skull: Cranium is intact. Sinuses/Orbits: Visualized paranasal sinuses and mastoid air cells are clear. Other:  Noncontributory CT CERVICAL SPINE FINDINGS Alignment: There is anterolisthesis C4 upon C5 which is related to facet arthropathy. There is reversed cervical lordosis. Skull base and vertebrae: No acute fracture.  No dislocation. Soft tissues and spinal canal: No evidence of spinal hematoma or soft tissue injury. Disc levels: There is severe narrowing of the C5-6 and C6-7 discs with posterior osteophytic ridging. Spinal canal is grossly patent. Upper chest: Emphysema.  No lung mass. Other: Thyroid is unremarkable. IMPRESSION: No acute intracranial pathology. No evidence of cervical spine injury. Chronic changes. Electronically Signed   By: Marybelle Killings M.D.   On: 12/21/2016 13:39   Ct Knee Right Wo Contrast  Result Date: 12/21/2016 CLINICAL DATA:  Acute distal femur fracture. EXAM: CT OF THE RIGHT KNEE WITHOUT CONTRAST TECHNIQUE: Multidetector CT imaging of the right knee was performed according to the standard protocol. Multiplanar CT image reconstructions were also generated. COMPARISON:  Radiographs dated 12/21/2016 FINDINGS: Bones/Joint/Cartilage There is an old healed fracture of the distal right femoral shaft with an intramedullary nail in place. There is an old fracture of the lateral femoral condyle with 2 screws in place in the lateral femoral condyle. There is an acute impacted transverse fracture of the metaphysis of the distal right femur. There are no acute vertical fractures through the femoral condyles or intertrochanteric notch. IMPRESSION: Impacted transverse fracture of the metaphysis of the distal right femur. Electronically Signed   By: Lorriane Shire M.D.   On: 12/21/2016 16:26   Mr Thoracic Spine Wo Contrast  Result Date: 12/21/2016 CLINICAL DATA:  Initial evaluation for acute low back pain, history of osteoporosis, suspected compression fracture. EXAM: MRI THORACIC AND LUMBAR SPINE WITHOUT CONTRAST TECHNIQUE: Multiplanar and multiecho pulse sequences of the thoracic and lumbar spine were  obtained without intravenous contrast. COMPARISON:  None. FINDINGS: MRI THORACIC SPINE FINDINGS Alignment: Mild exaggeration of the normal thoracic kyphosis. No listhesis or subluxation. Vertebrae: There is abnormal linear T1 hypointense signal intensity with associated T2/STIR hyperintensity involving the T11 vertebral body, consistent with acute compression fracture. Associated height loss of up to 50% the with trace 2 mm bony retropulsion. This fracture is benign/ osteoporotic in appearance. Vertebral body heights are otherwise maintained. No other evidence for acute or chronic fracture. Signal intensity within the vertebral body bone marrow within normal limits. No worrisome osseous lesions. Few small benign hemangiomas noted. Cord:  Signal intensity within the thoracic spinal cord is normal. Paraspinal and other soft tissues: Paraspinous soft tissues within normal limits. Visualized visceral structures grossly unremarkable. Atelectatic changes present within the posterior right lung base. Disc levels: No significant degenerative changes seen within the thoracic spine. No significant disc bulge or focal disc protrusion. No canal or foraminal stenosis. MRI LUMBAR SPINE FINDINGS Segmentation: Normal segmentation. Lowest well-formed disc is labeled the L5-S1 level. Alignment: Mild exaggeration of the normal thoracic kyphosis. Trace retrolisthesis of L1 on L2 and L2 on L3. Vertebral bodies otherwise normally aligned. Vertebrae: Acute compression fracture involving the T11 vertebral body, described on thoracic spine portion of this report. Vertebral body heights are otherwise maintained. No other evidence for acute  or chronic fracture within the lumbar spine. Signal intensity within the vertebral body bone marrow within normal limits. No discrete or worrisome osseous lesions. Conus medullaris: Extends to the L1 level and appears normal. Paraspinal and other soft tissues: Paraspinous soft tissues within normal limits.  Left kidney atrophic with scattered cysts. Infrarenal aorta ectatic measuring up to 2.6 cm. Disc levels: L1-2: Trace retrolisthesis of L1 on L2. Mild diffuse disc bulge with bilateral facet hypertrophy. No significant canal or foraminal stenosis. L2-3: Trace retrolisthesis of L2 on L3. Diffuse circumferential disc bulge. Moderate facet hypertrophy. Resultant mild to moderate canal with bilateral lateral recess stenosis, slightly greater on the left. No significant foraminal encroachment. L3-4: Minimal disc bulge. Moderate bilateral facet hypertrophy. Resultant mild canal and bilateral lateral recess stenosis. No significant foraminal encroachment. L4-5: Minimal annular disc bulge. Moderate bilateral facet arthrosis. Resultant mild to moderate left lateral recess stenosis. No significant canal narrowing. Foramina are widely patent. L5-S1: Shallow posterior disc bulge. Moderate bilateral facet arthropathy. No significant stenosis. IMPRESSION: MR THORACIC SPINE IMPRESSION 1. Acute compression fracture involving the superior endplate of Y70 with associated 50% height loss with trace 2 mm bony retropulsion. No significant stenosis. 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 2. No other acute traumatic injury within the thoracic spine. MR LUMBAR SPINE IMPRESSION 1. No acute compression fracture or other abnormality within the lumbar spine. 2. 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 3. Moderate facet arthropathy at L4-5 and L5-S1, which could serve as a source for back pain. 4. **An incidental finding of potential clinical significance has been found. 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 Recommend followup by ultrasound in 5 years. This recommendation follows ACR consensus guidelines: White Paper of the ACR Incidental Findings Committee II on Vascular Findings. J Am Coll  Radiol 2013; 10:789-794.** Electronically Signed   By: Jeannine Boga M.D.   On: 12/21/2016 21:28   Mr Lumbar Spine Wo Contrast  Result Date: 12/21/2016 CLINICAL DATA:  Initial evaluation for acute low back pain, history of osteoporosis, suspected compression fracture. EXAM: MRI THORACIC AND LUMBAR SPINE WITHOUT CONTRAST TECHNIQUE: Multiplanar and multiecho pulse sequences of the thoracic and lumbar spine were obtained without intravenous contrast. COMPARISON:  None. FINDINGS: MRI THORACIC SPINE FINDINGS Alignment: Mild exaggeration of the normal thoracic kyphosis. No listhesis or subluxation. Vertebrae: There is abnormal linear T1 hypointense signal intensity with associated T2/STIR hyperintensity involving the T11 vertebral body, consistent with acute compression fracture. Associated height loss of up to 50% the with trace 2 mm bony retropulsion. This fracture is benign/ osteoporotic in appearance. Vertebral body heights are otherwise maintained. No other evidence for acute or chronic fracture. Signal intensity within the vertebral body bone marrow within normal limits. No worrisome osseous lesions. Few small benign hemangiomas noted. Cord:  Signal intensity within the thoracic spinal cord is normal. Paraspinal and other soft tissues: Paraspinous soft tissues within normal limits. Visualized visceral structures grossly unremarkable. Atelectatic changes present within the posterior right lung base. Disc levels: No significant degenerative changes seen within the thoracic spine. No significant disc bulge or focal disc protrusion. No canal or foraminal stenosis. MRI LUMBAR SPINE FINDINGS Segmentation: Normal segmentation. Lowest well-formed disc is labeled the L5-S1 level. Alignment: Mild exaggeration of the normal thoracic kyphosis. Trace retrolisthesis of L1 on L2 and L2 on L3. Vertebral bodies otherwise normally aligned. Vertebrae: Acute compression fracture involving the T11 vertebral body, described on  thoracic spine portion of this report. Vertebral body heights are otherwise maintained. No other evidence for acute or chronic fracture within the lumbar spine. Signal intensity within the vertebral body bone marrow within normal limits. No discrete or worrisome osseous lesions. Conus medullaris: Extends to the L1 level and appears normal. Paraspinal and other soft tissues: Paraspinous soft tissues within normal limits. Left kidney atrophic with scattered cysts. Infrarenal aorta ectatic measuring up to 2.6 cm. Disc levels: L1-2: Trace retrolisthesis of L1 on L2. Mild diffuse disc bulge with bilateral facet hypertrophy. No significant canal or foraminal stenosis. L2-3: Trace retrolisthesis of L2 on L3. Diffuse circumferential disc bulge. Moderate facet hypertrophy. Resultant mild to moderate canal with bilateral lateral recess stenosis, slightly greater on the left. No significant foraminal encroachment. L3-4: Minimal disc bulge. Moderate bilateral facet hypertrophy. Resultant mild canal and bilateral lateral recess stenosis. No significant foraminal encroachment. L4-5: Minimal annular disc bulge. Moderate bilateral facet arthrosis. Resultant mild to moderate left lateral recess stenosis. No significant canal narrowing. Foramina are widely patent. L5-S1: Shallow posterior disc bulge. Moderate bilateral facet arthropathy. No significant stenosis. IMPRESSION: MR THORACIC SPINE IMPRESSION 1. Acute compression fracture involving the superior endplate of E99 with associated 50% height loss with trace 2 mm bony retropulsion. No significant stenosis. 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 2. No other acute traumatic injury within the thoracic spine. MR LUMBAR SPINE IMPRESSION 1. No acute compression fracture or other abnormality within the lumbar spine. 2. 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 3. Moderate facet  arthropathy at L4-5 and L5-S1, which could serve as a source for back pain. 4. **An incidental finding of potential clinical significance has been found. 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 Recommend followup by ultrasound in 5 years. This recommendation follows ACR consensus guidelines: White Paper of the ACR Incidental Findings Committee II on Vascular Findings. J Am Coll Radiol 2013; 10:789-794.** Electronically Signed   By: Jeannine Boga M.D.   On: 12/21/2016 21:28   Pelvis Portable  Result Date: 12/22/2016 CLINICAL DATA:  Recent ORIF of right femoral fracture EXAM: PORTABLE PELVIS 1-2 VIEWS COMPARISON:  02/21/2017 FINDINGS: Postsurgical changes are again noted in the proximal right femur stable from the prior exam. Pelvic ring is intact. Degenerative changes of the lumbar spine are noted. No soft tissue changes are seen. IMPRESSION: Chronic changes in the pelvis.  No acute abnormality noted. Electronically Signed   By: Inez Catalina M.D.   On: 12/22/2016 21:34   Dg C-arm Gt 120 Min  Result Date: 12/22/2016 CLINICAL DATA:  ORIF of right femoral fracture EXAM: RIGHT FEMUR 2 VIEWS; DG C-ARM GT 120 MIN COMPARISON:  12/21/2016 FLUOROSCOPY TIME:  Fluoroscopy Time:  1 minutes 31 seconds Radiation Exposure Index (if provided by the fluoroscopic device): Not available Number of Acquired Spot Images: Sex FINDINGS: Fixation sideplate is noted along the distal femur with multiple fixation screws. Additionally the previously seen medullary rod remains in place. Multiple cerclage wires are noted as well. Fracture fragments are in near anatomic alignment. Removal of the previously placed screws in the lateral femoral condyles is noted. Bone cement is noted in the distal fracture site. IMPRESSION: ORIF of distal right femoral fracture Electronically Signed   By: Inez Catalina M.D.   On: 12/22/2016 21:21   Dg Hip Unilat With Pelvis 2-3 Views Right  Result Date:  12/21/2016 CLINICAL DATA:  Right leg pain  following a fall 3 days ago. EXAM: DG HIP (WITH OR WITHOUT PELVIS) 2-3V RIGHT COMPARISON:  Right femur radiographs obtained at the same obtained. FINDINGS: Intramedullary rod and screw fixation of the proximal femur. Old, healed proximal femur fracture, laterally. No acute fracture or dislocation. Diffuse osteopenia. IMPRESSION: No right hip fracture or dislocation. Electronically Signed   By: Claudie Revering M.D.   On: 12/21/2016 13:30   Dg Femur, Min 2 Views Right  Result Date: 12/22/2016 CLINICAL DATA:  ORIF of right femoral fracture EXAM: RIGHT FEMUR 2 VIEWS; DG C-ARM GT 120 MIN COMPARISON:  12/21/2016 FLUOROSCOPY TIME:  Fluoroscopy Time:  1 minutes 31 seconds Radiation Exposure Index (if provided by the fluoroscopic device): Not available Number of Acquired Spot Images: Sex FINDINGS: Fixation sideplate is noted along the distal femur with multiple fixation screws. Additionally the previously seen medullary rod remains in place. Multiple cerclage wires are noted as well. Fracture fragments are in near anatomic alignment. Removal of the previously placed screws in the lateral femoral condyles is noted. Bone cement is noted in the distal fracture site. IMPRESSION: ORIF of distal right femoral fracture Electronically Signed   By: Inez Catalina M.D.   On: 12/22/2016 21:21   Dg Femur Min 2 Views Right  Result Date: 12/21/2016 CLINICAL DATA:  Right leg pain following a fall 3 days ago. EXAM: RIGHT FEMUR 2 VIEWS COMPARISON:  None. FINDINGS: Intramedullary variety extending the length of the femur with proximal and distal fixation screws. Mildly comminuted fracture of the distal femoral metaphysis with possible extension into the joint space. Posterior displacement and angulation of the distal fragments. Old, healed proximal femur fracture. Diffuse osteopenia. Moderate-sized effusion. Atheromatous arterial calcifications. IMPRESSION: Mildly comminuted fracture of the distal  femoral metaphysis with possible extension into the joint space with an associated moderate-sized effusion. Electronically Signed   By: Claudie Revering M.D.   On: 12/21/2016 13:29    (Echo, Carotid, EGD, Colonoscopy, ERCP)    Subjective:   Discharge Exam: Vitals:   12/24/16 2100 12/25/16 0300  BP: (!) 130/58 (!) 147/66  Pulse: 76 79  Resp: 16 16  Temp: 99.1 F (37.3 C) 98.4 F (36.9 C)   Vitals:   12/24/16 0300 12/24/16 1455 12/24/16 2100 12/25/16 0300  BP: (!) 130/57 (!) 107/50 (!) 130/58 (!) 147/66  Pulse: 73 88 76 79  Resp: 17 16 16 16   Temp: 98.3 F (36.8 C) 99 F (37.2 C) 99.1 F (37.3 C) 98.4 F (36.9 C)  TempSrc: Oral Oral Oral Oral  SpO2: 99% 93% 95% 93%  Weight:      Height:        General: NAD , no complaints. Cardiovascular: RRR, S1/S2 +, no rubs, no gallops Respiratory: CTAB, no wheezing, no rhonchi Abdominal: Soft, NT, ND, bowel sounds + Extremities: no edema, no cyanosis    The results of significant diagnostics from this hospitalization (including imaging, microbiology, ancillary and laboratory) are listed below for reference.     Microbiology: Recent Results (from the past 240 hour(s))  Surgical pcr screen     Status: None   Collection Time: 12/22/16 11:15 AM  Result Value Ref Range Status   MRSA, PCR NEGATIVE NEGATIVE Final   Staphylococcus aureus NEGATIVE NEGATIVE Final    Comment:        The Xpert SA Assay (FDA approved for NASAL specimens in patients over 50 years of age), is one component of a comprehensive surveillance program.  Test performance has been validated by [facility] for  patients greater than or equal to 14 year old. It is not intended to diagnose infection nor to guide or monitor treatment.      Labs: BNP (last 3 results) No results for input(s): BNP in the last 8760 hours. Basic Metabolic Panel:  Recent Labs Lab 12/21/16 1232 12/21/16 1854 12/22/16 0546 12/23/16 0253 12/24/16 0414  NA 138  --  139 137 137   K 3.4*  --  3.3* 5.5* 4.3  CL 105  --  107 107 108  CO2 22  --  23 18* 21*  GLUCOSE 109*  --  101* 135* 117*  BUN 20  --  16 11 9   CREATININE 0.71 0.69 0.59 0.70 0.49  CALCIUM 8.6*  --  8.5* 8.2* 8.4*  MG  --   --  1.6*  --   --    Liver Function Tests:  Recent Labs Lab 12/21/16 1232 12/22/16 0546 12/24/16 0414  AST 71* 55* 45*  ALT 30 28 11*  ALKPHOS 73 60 53  BILITOT 0.8 1.0 0.8  PROT 6.6 5.6* 4.8*  ALBUMIN 3.2* 2.6* 2.3*   No results for input(s): LIPASE, AMYLASE in the last 168 hours. No results for input(s): AMMONIA in the last 168 hours. CBC:  Recent Labs Lab 12/21/16 1854 12/22/16 0546 12/23/16 0253 12/24/16 0414 12/25/16 0309  WBC 10.8* 6.5 9.7 7.6 8.1  NEUTROABS  --  3.7 9.0*  --   --   HGB 9.7* 9.0* 9.6* 8.6* 8.8*  HCT 29.4* 27.1* 29.6* 26.3* 26.8*  MCV 95.1 93.1 95.5 94.3 94.0  PLT 319 291 297 321 398   Cardiac Enzymes:  Recent Labs Lab 12/22/16 0546  CKTOTAL 520*   BNP: Invalid input(s): POCBNP CBG:  Recent Labs Lab 12/23/16 2249  GLUCAP 115*   D-Dimer No results for input(s): DDIMER in the last 72 hours. Hgb A1c No results for input(s): HGBA1C in the last 72 hours. Lipid Profile No results for input(s): CHOL, HDL, LDLCALC, TRIG, CHOLHDL, LDLDIRECT in the last 72 hours. Thyroid function studies  Recent Labs  12/23/16 0253  TSH 1.072   Anemia work up  Recent Labs  12/23/16 0253  VITAMINB12 106*  FOLATE 9.8  TIBC 193*  IRON 12*  RETICCTPCT 1.5   Urinalysis    Component Value Date/Time   COLORURINE YELLOW 12/21/2016 1414   APPEARANCEUR HAZY (A) 12/21/2016 1414   LABSPEC 1.021 12/21/2016 1414   PHURINE 5.0 12/21/2016 1414   GLUCOSEU NEGATIVE 12/21/2016 1414   HGBUR MODERATE (A) 12/21/2016 1414   BILIRUBINUR NEGATIVE 12/21/2016 1414   KETONESUR 80 (A) 12/21/2016 1414   PROTEINUR 30 (A) 12/21/2016 1414   NITRITE NEGATIVE 12/21/2016 1414   LEUKOCYTESUR NEGATIVE 12/21/2016 1414   Sepsis Labs Invalid input(s):  PROCALCITONIN,  WBC,  LACTICIDVEN Microbiology Recent Results (from the past 240 hour(s))  Surgical pcr screen     Status: None   Collection Time: 12/22/16 11:15 AM  Result Value Ref Range Status   MRSA, PCR NEGATIVE NEGATIVE Final   Staphylococcus aureus NEGATIVE NEGATIVE Final    Comment:        The Xpert SA Assay (FDA approved for NASAL specimens in patients over 11 years of age), is one component of a comprehensive surveillance program.  Test performance has been validated by [facility] for patients greater than or equal to 2 year old. It is not intended to diagnose infection nor to guide or monitor treatment.      Time coordinating discharge: Over 30 minutes  SIGNED:   Waldron Session, MD  [facility] 12/25/2016, 9:13 AM Pager   If 7PM-7AM, please contact night-coverage [email or web address] Password [email or web address]

## 2016-12-26 LAB — TYPE AND SCREEN
ABO/RH(D): O POS
Antibody Screen: NEGATIVE
Unit division: 0
Unit division: 0

## 2016-12-26 LAB — BPAM RBC
BLOOD PRODUCT EXPIRATION DATE: 201808082359
BLOOD PRODUCT EXPIRATION DATE: 201808082359
ISSUE DATE / TIME: 201807171725
ISSUE DATE / TIME: 201807171725
Unit Type and Rh: 5100
Unit Type and Rh: 5100

## 2016-12-28 ENCOUNTER — Non-Acute Institutional Stay (SKILLED_NURSING_FACILITY): Payer: Medicare Other | Admitting: Internal Medicine

## 2016-12-28 ENCOUNTER — Encounter: Payer: Self-pay | Admitting: Internal Medicine

## 2016-12-28 ENCOUNTER — Telehealth: Payer: Self-pay

## 2016-12-28 DIAGNOSIS — S22080D Wedge compression fracture of T11-T12 vertebra, subsequent encounter for fracture with routine healing: Secondary | ICD-10-CM | POA: Diagnosis not present

## 2016-12-28 DIAGNOSIS — M8000XD Age-related osteoporosis with current pathological fracture, unspecified site, subsequent encounter for fracture with routine healing: Secondary | ICD-10-CM | POA: Diagnosis not present

## 2016-12-28 DIAGNOSIS — T3795XA Adverse effect of unspecified systemic anti-infective and antiparasitic, initial encounter: Secondary | ICD-10-CM | POA: Diagnosis not present

## 2016-12-28 DIAGNOSIS — L270 Generalized skin eruption due to drugs and medicaments taken internally: Secondary | ICD-10-CM | POA: Diagnosis not present

## 2016-12-28 DIAGNOSIS — S72491G Other fracture of lower end of right femur, subsequent encounter for closed fracture with delayed healing: Secondary | ICD-10-CM | POA: Diagnosis not present

## 2016-12-28 DIAGNOSIS — F332 Major depressive disorder, recurrent severe without psychotic features: Secondary | ICD-10-CM | POA: Diagnosis not present

## 2016-12-28 DIAGNOSIS — D62 Acute posthemorrhagic anemia: Secondary | ICD-10-CM | POA: Diagnosis not present

## 2016-12-28 DIAGNOSIS — G250 Essential tremor: Secondary | ICD-10-CM

## 2016-12-28 DIAGNOSIS — E43 Unspecified severe protein-calorie malnutrition: Secondary | ICD-10-CM | POA: Diagnosis not present

## 2016-12-28 NOTE — Progress Notes (Signed)
: Provider:  Noah Delaine. Sheppard Coil, MD Location:  [facility] Room Number: [place]:  SNF ([phone])  PCP: Gildardo Cranker, DO Patient Care Team: Gildardo Cranker, DO as PCP - General (Internal Medicine)  Extended Emergency Contact Information Primary Emergency Contact: Stewart,Keith Address: [address] [place], [place] [address] Johnnette Litter of [place] Phone: [phone] Mobile Phone: [phone] Relation: Spouse Secondary Emergency Contact: Hadley Pen Address: [address] [place] [address] Johnnette Litter of [place] Phone: [phone] Mobile Phone: [phone] Relation: Sister     Allergies: Influenza vaccines; Omeprazole; and Coumadin [warfarin sodium]  Chief Complaint  Patient presents with  . New Admit To SNF    following hospitalization 12/21/16 to 12/25/16 fell at home, open reduction internaal fixation of distal right femoral fracture.     HPI: Patient is 76 y.o. female with osteoporosis depression who presented to the [facility] emergency department per EMS after a fall on Friday and another fall on Sunday. She been unable to ambulate since the fall, had not been eating much due to being able to get up and finally called 911. Patient reported the fall was accidental she denied chest pain, syncope, no recent illness, no fever, no cough, no chest pain, no dysuria. In the ED her vital signs were stable with the exception of a slight elevation of WBC chest x-ray and cardiac studies were negative but a CT of the right leg showed an impacted transverse fracture of the metaphysis of the right distal femur. Both was consult and patient was admitted to [facility] from 7/16-20 where patient underwent an ORIF to restore the alignment for possible later surgery in a few weeks that would require a bone graft. Patient was also found to have an acute fracture involving the superior endplate of Y77, benign  structure and requiring only pain medications. Patient is admitted to skilled nursing facility for nonweightbearing for 6-8 weeks, supportive care and OT/PT. While at skilled nursing facility patient will be treated for essential tremor with Tenormin, depression treated with Paxil, and osteoporosis treated with calcium plus D supplement.  Past Medical History:  Diagnosis Date  . Cervical cancer ([place])   . Depression   . Essential tremor 10/04/2013  . Femur fracture, right ([place]) 12/21/2016  . Gastroesophageal reflux disease without esophagitis 07/10/2015  . 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 Osteoporosis   . Osteoporosis 07/10/2015  . 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 Reflux   . Severe episode of recurrent major depressive disorder, without psychotic features ([place]) 07/10/2015  . Situational anxiety 07/29/2016    Past Surgical History:  Procedure Laterality Date  . ANKLE SURGERY    . CATARACT EXTRACTION    . [place]   cancer-radiation   . FEMUR SURGERY  2005   fractured right femur   . HIP SURGERY  2006   fractured right hip   . ORIF FEMUR FRACTURE Right 12/22/2016   Procedure: OPEN REDUCTION INTERNAL FIXATION (ORIF) DISTAL FEMUR FRACTURE;  Surgeon: [name], MD;  Location: [facility];  Service: Orthopedics;  Laterality: Right;  . TONSILLECTOMY      Allergies as of 12/28/2016      Reactions   Influenza Vaccines Hives   Hives/swelling/injection site war to the touch. No relief with benadryl    Omeprazole Hives   Coumadin [warfarin  Sodium] Rash      Medication List       Accurate as of 12/28/16 11:53 AM. Always use your most recent med list.          acetaminophen-codeine 300-30 MG tablet Commonly known as:  TYLENOL #3 Take 1 tablet by mouth every 6 (six) hours as needed for moderate pain.   atenolol 25 MG tablet Commonly known as:  TENORMIN Take 0.5 tablets (12.5 mg total) by mouth daily.   CALCIUM + D PO Take 1 tablet by mouth  daily.   enoxaparin 30 MG/0.3ML injection Commonly known as:  LOVENOX Inject 0.3 mLs (30 mg total) into the skin daily.   feeding supplement (ENSURE ENLIVE) Liqd Take 237 mLs by mouth 2 (two) times daily between meals.   menthol-cetylpyridinium 3 MG lozenge Commonly known as:  CEPACOL Take 1 lozenge by mouth. Take 1 lozenge every 2 hours as needed for sore throat   multivitamin tablet Take 1 tablet by mouth daily.   PARoxetine 40 MG tablet Commonly known as:  PAXIL TAKE 1 TABLET(40 MG) BY MOUTH DAILY       Meds ordered this encounter  Medications  . menthol-cetylpyridinium (CEPACOL) 3 MG lozenge    Sig: Take 1 lozenge by mouth. Take 1 lozenge every 2 hours as needed for sore throat    Immunization History  Administered Date(s) Administered  . Influenza,inj,Quad PF,36+ Mos 07/10/2015  . PPD Test 12/25/2016  . Pneumococcal Polysaccharide-23 07/10/2015  . Tdap 12/21/2016    Social History  Substance Use Topics  . Smoking status: Former Smoker    Packs/day: 2.00    Types: Cigarettes    Quit date: 06/08/2013  . Smokeless tobacco: Never Used  . Alcohol use Yes     Comment: 3-4 glasses wine/day    Family history is   Family History  Problem Relation Age of Onset  . Heart disease Father       Review of Systems  DATA OBTAINED: from patient, nurse GENERAL:  no fevers, fatigue, appetite changes SKIN: No itching, or rash EYES: No eye pain, redness, discharge EARS: No earache, tinnitus, change in hearing NOSE: No congestion, drainage or bleeding  MOUTH/THROAT: No mouth or tooth pain, No sore throat RESPIRATORY: No cough, wheezing, SOB CARDIAC: No chest pain, palpitations, lower extremity edema  GI: No abdominal pain, No N/V/D or constipation, No heartburn or reflux  GU: No dysuria, frequency or urgency, or incontinence  MUSCULOSKELETAL: No unrelieved bone/joint pain NEUROLOGIC: No headache, dizziness or focal weakness PSYCHIATRIC: No c/o anxiety or sadness    Vitals:   12/28/16 1139  BP: (!) 142/76  Pulse: 74  Resp: 18  Temp: 97.6 F (36.4 C)    SpO2 Readings from Last 1 Encounters:  12/28/16 99%   Body mass index is 18.25 kg/m.     Physical Exam  GENERAL APPEARANCE: Alert, conversant,  No acute distress;Thin, frail white female  SKIN: Red macular confluent rash on torso and arms; pruritic HEAD: Normocephalic, atraumatic  EYES: Conjunctiva/lids clear. Pupils round, reactive. EOMs intact.  EARS: External exam WNL, canals clear. Hearing grossly normal.  NOSE: No deformity or discharge.  MOUTH/THROAT: Lips w/o lesions  RESPIRATORY: Breathing is even, unlabored. Lung sounds are clear   CARDIOVASCULAR: Heart RRR no murmurs, rubs or gallops. No peripheral edema.   GASTROINTESTINAL: Abdomen is soft, non-tender, not distended w/ normal bowel sounds. GENITOURINARY: Bladder non tender, not distended  MUSCULOSKELETAL: No abnormal joints or musculature NEUROLOGIC:  Cranial nerves 2-12 grossly intact. Moves  all extremities  PSYCHIATRIC: Mood and affect appropriate to situation, no behavioral issues  Patient Active Problem List   Diagnosis Date Noted  . Femur fracture ([place]) 12/22/2016  . Femur fracture, right ([place]) 12/21/2016  . Prediabetes 07/29/2016  . Situational anxiety 07/29/2016  . Gastroesophageal reflux disease without esophagitis 07/10/2015  . Severe episode of recurrent major depressive disorder, without psychotic features ([place]) 07/10/2015  . Osteoporosis 07/10/2015  . 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 Essential tremor 10/04/2013  . 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      Labs reviewed: Basic Metabolic Panel:    Component Value Date/Time   NA 137 12/24/2016 0414   NA 136 10/09/2015 1607   K 4.3 12/24/2016 0414   CL 108 12/24/2016 0414   CO2 21 (L) 12/24/2016 0414   GLUCOSE 117 (H) 12/24/2016 0414   BUN 9 12/24/2016 0414   BUN 10 10/09/2015 1607   CREATININE 0.49 12/24/2016 0414    CREATININE 0.76 07/29/2016 1144   CALCIUM 8.4 (L) 12/24/2016 0414   PROT 4.8 (L) 12/24/2016 0414   PROT 6.6 10/09/2015 1607   ALBUMIN 2.3 (L) 12/24/2016 0414   ALBUMIN 3.2 (L) 10/09/2015 1607   AST 45 (H) 12/24/2016 0414   ALT 11 (L) 12/24/2016 0414   ALKPHOS 53 12/24/2016 0414   BILITOT 0.8 12/24/2016 0414   BILITOT 0.7 10/09/2015 1607   GFRNONAA >60 12/24/2016 0414   GFRNONAA 77 07/29/2016 1144   GFRAA >60 12/24/2016 0414   GFRAA 89 07/29/2016 1144     Recent Labs  12/22/16 0546 12/23/16 0253 12/24/16 0414  NA 139 137 137  K 3.3* 5.5* 4.3  CL 107 107 108  CO2 23 18* 21*  GLUCOSE 101* 135* 117*  BUN 16 11 9   CREATININE 0.59 0.70 0.49  CALCIUM 8.5* 8.2* 8.4*  MG 1.6*  --   --    Liver Function Tests:  Recent Labs  12/21/16 1232 12/22/16 0546 12/24/16 0414  AST 71* 55* 45*  ALT 30 28 11*  ALKPHOS 73 60 53  BILITOT 0.8 1.0 0.8  PROT 6.6 5.6* 4.8*  ALBUMIN 3.2* 2.6* 2.3*   No results for input(s): LIPASE, AMYLASE in the last 8760 hours. No results for input(s): AMMONIA in the last 8760 hours. CBC:  Recent Labs  12/22/16 0546 12/23/16 0253 12/24/16 0414 12/25/16 0309  WBC 6.5 9.7 7.6 8.1  NEUTROABS 3.7 9.0*  --   --   HGB 9.0* 9.6* 8.6* 8.8*  HCT 27.1* 29.6* 26.3* 26.8*  MCV 93.1 95.5 94.3 94.0  PLT 291 297 321 398   Lipid No results for input(s): CHOL, HDL, LDLCALC, TRIG in the last 8760 hours.  Cardiac Enzymes:  Recent Labs  12/22/16 0546  CKTOTAL 520*   BNP: No results for input(s): BNP in the last 8760 hours. No results found for: [facility] Lab Results  Component Value Date   HGBA1C 5.9 (H) 10/09/2015   Lab Results  Component Value Date   TSH 1.072 12/23/2016   Lab Results  Component Value Date   VITAMINB12 106 (L) 12/23/2016   Lab Results  Component Value Date   FOLATE 9.8 12/23/2016   Lab Results  Component Value Date   IRON 12 (L) 12/23/2016   TIBC 193 (L) 12/23/2016    Imaging and Procedures obtained prior to SNF  admission: Dg Chest 2 View  Result Date: 12/21/2016 CLINICAL DATA:  [place] 3 days ago.  No reported chest symptoms. EXAM: CHEST  2 VIEW COMPARISON:  08/13/2005. FINDINGS: Normal sized heart. Mild linear density at both lung bases. Approximately 50% lower thoracic vertebral compression deformity. No visible acute fracture lines. No previous lateral view for comparison. IMPRESSION: 1. Approximately 50% lower thoracic vertebral compression deformity, age indeterminate. 2. Mild bibasilar linear atelectasis or scarring. Electronically Signed   By: Claudie Revering M.D.   On: 12/21/2016 13:26   Dg Ankle 2 Views Right  Result Date: 12/21/2016 CLINICAL DATA:  Remote right ankle fracture.  Ankle now on stable. EXAM: RIGHT ANKLE - 2 VIEW COMPARISON:  None. FINDINGS: Two views study shows plate and screw fixation of the distal tibia with persistent apex posterior angulation. Degenerative changes are noted at the tibiotalar joint. Relationship of the distal fibula to the ankle mortise not well demonstrated on this study. Bones are diffusely demineralized. IMPRESSION: 1. Posttraumatic deformity of the distal tibia with evidence of ORIF. 2. Osteoarthritis of the tibiotalar joint. Electronically Signed   By: Misty Stanley M.D.   On: 12/21/2016 18:54   Ct Head Wo Contrast  Result Date: 12/21/2016 CLINICAL DATA:  Fall 3 days ago EXAM: CT HEAD WITHOUT CONTRAST CT CERVICAL SPINE WITHOUT CONTRAST TECHNIQUE: Multidetector CT imaging of the head and cervical spine was performed following the standard protocol without intravenous contrast. Multiplanar CT image reconstructions of the cervical spine were also generated. COMPARISON:  None. FINDINGS: CT HEAD FINDINGS Brain: There are mild chronic ischemic changes in the periventricular white matter. No mass effect, midline shift, or acute hemorrhage. Mild global atrophy appropriate to age. Vascular: No hyperdense vessel or unexpected calcification. Skull: Cranium is intact.  Sinuses/Orbits: Visualized paranasal sinuses and mastoid air cells are clear. Other: Noncontributory CT CERVICAL SPINE FINDINGS Alignment: There is anterolisthesis C4 upon C5 which is related to facet arthropathy. There is reversed cervical lordosis. Skull base and vertebrae: No acute fracture.  No dislocation. Soft tissues and spinal canal: No evidence of spinal hematoma or soft tissue injury. Disc levels: There is severe narrowing of the C5-6 and C6-7 discs with posterior osteophytic ridging. Spinal canal is grossly patent. Upper chest: Emphysema.  No lung mass. Other: Thyroid is unremarkable. IMPRESSION: No acute intracranial pathology. No evidence of cervical spine injury. Chronic changes. Electronically Signed   By: Marybelle Killings M.D.   On: 12/21/2016 13:39   Ct Cervical Spine Wo Contrast  Result Date: 12/21/2016 CLINICAL DATA:  Fall 3 days ago EXAM: CT HEAD WITHOUT CONTRAST CT CERVICAL SPINE WITHOUT CONTRAST TECHNIQUE: Multidetector CT imaging of the head and cervical spine was performed following the standard protocol without intravenous contrast. Multiplanar CT image reconstructions of the cervical spine were also generated. COMPARISON:  None. FINDINGS: CT HEAD FINDINGS Brain: There are mild chronic ischemic changes in the periventricular white matter. No mass effect, midline shift, or acute hemorrhage. Mild global atrophy appropriate to age. Vascular: No hyperdense vessel or unexpected calcification. Skull: Cranium is intact. Sinuses/Orbits: Visualized paranasal sinuses and mastoid air cells are clear. Other: Noncontributory CT CERVICAL SPINE FINDINGS Alignment: There is anterolisthesis C4 upon C5 which is related to facet arthropathy. There is reversed cervical lordosis. Skull base and vertebrae: No acute fracture.  No dislocation. Soft tissues and spinal canal: No evidence of spinal hematoma or soft tissue injury. Disc levels: There is severe narrowing of the C5-6 and C6-7 discs with posterior  osteophytic ridging. Spinal canal is grossly patent. Upper chest: Emphysema.  No lung mass. Other: Thyroid is unremarkable. IMPRESSION: No acute intracranial pathology. No evidence of cervical spine injury. Chronic changes. Electronically Signed  By: Marybelle Killings M.D.   On: 12/21/2016 13:39   Ct Knee Right Wo Contrast  Result Date: 12/21/2016 CLINICAL DATA:  Acute distal femur fracture. EXAM: CT OF THE RIGHT KNEE WITHOUT CONTRAST TECHNIQUE: Multidetector CT imaging of the right knee was performed according to the standard protocol. Multiplanar CT image reconstructions were also generated. COMPARISON:  Radiographs dated 12/21/2016 FINDINGS: Bones/Joint/Cartilage There is an old healed fracture of the distal right femoral shaft with an intramedullary nail in place. There is an old fracture of the lateral femoral condyle with 2 screws in place in the lateral femoral condyle. There is an acute impacted transverse fracture of the metaphysis of the distal right femur. There are no acute vertical fractures through the femoral condyles or intertrochanteric notch. IMPRESSION: Impacted transverse fracture of the metaphysis of the distal right femur. Electronically Signed   By: Lorriane Shire M.D.   On: 12/21/2016 16:26   Mr Thoracic Spine Wo Contrast  Result Date: 12/21/2016 CLINICAL DATA:  Initial evaluation for acute low back pain, history of osteoporosis, suspected compression fracture. EXAM: MRI THORACIC AND LUMBAR SPINE WITHOUT CONTRAST TECHNIQUE: Multiplanar and multiecho pulse sequences of the thoracic and lumbar spine were obtained without intravenous contrast. COMPARISON:  None. FINDINGS: MRI THORACIC SPINE FINDINGS Alignment: Mild exaggeration of the normal thoracic kyphosis. No listhesis or subluxation. Vertebrae: There is abnormal linear T1 hypointense signal intensity with associated T2/STIR hyperintensity involving the T11 vertebral body, consistent with acute compression fracture. Associated height  loss of up to 50% the with trace 2 mm bony retropulsion. This fracture is benign/ osteoporotic in appearance. Vertebral body heights are otherwise maintained. No other evidence for acute or chronic fracture. Signal intensity within the vertebral body bone marrow within normal limits. No worrisome osseous lesions. Few small benign hemangiomas noted. Cord:  Signal intensity within the thoracic spinal cord is normal. Paraspinal and other soft tissues: Paraspinous soft tissues within normal limits. Visualized visceral structures grossly unremarkable. Atelectatic changes present within the posterior right lung base. Disc levels: No significant degenerative changes seen within the thoracic spine. No significant disc bulge or focal disc protrusion. No canal or foraminal stenosis. MRI LUMBAR SPINE FINDINGS Segmentation: Normal segmentation. Lowest well-formed disc is labeled the L5-S1 level. Alignment: Mild exaggeration of the normal thoracic kyphosis. Trace retrolisthesis of L1 on L2 and L2 on L3. Vertebral bodies otherwise normally aligned. Vertebrae: Acute compression fracture involving the T11 vertebral body, described on thoracic spine portion of this report. Vertebral body heights are otherwise maintained. No other evidence for acute or chronic fracture within the lumbar spine. Signal intensity within the vertebral body bone marrow within normal limits. No discrete or worrisome osseous lesions. Conus medullaris: Extends to the L1 level and appears normal. Paraspinal and other soft tissues: Paraspinous soft tissues within normal limits. Left kidney atrophic with scattered cysts. Infrarenal aorta ectatic measuring up to 2.6 cm. Disc levels: L1-2: Trace retrolisthesis of L1 on L2. Mild diffuse disc bulge with bilateral facet hypertrophy. No significant canal or foraminal stenosis. L2-3: Trace retrolisthesis of L2 on L3. Diffuse circumferential disc bulge. Moderate facet hypertrophy. Resultant mild to moderate canal with  bilateral lateral recess stenosis, slightly greater on the left. No significant foraminal encroachment. L3-4: Minimal disc bulge. Moderate bilateral facet hypertrophy. Resultant mild canal and bilateral lateral recess stenosis. No significant foraminal encroachment. L4-5: Minimal annular disc bulge. Moderate bilateral facet arthrosis. Resultant mild to moderate left lateral recess stenosis. No significant canal narrowing. Foramina are widely patent. L5-S1: Shallow posterior disc bulge.  Moderate bilateral facet arthropathy. No significant stenosis. IMPRESSION: MR THORACIC SPINE IMPRESSION 1. Acute compression fracture involving the superior endplate of F81 with associated 50% height loss with trace 2 mm bony retropulsion. No significant stenosis. 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 2. No other acute traumatic injury within the thoracic spine. MR LUMBAR SPINE IMPRESSION 1. No acute compression fracture or other abnormality within the lumbar spine. 2. 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 3. Moderate facet arthropathy at L4-5 and L5-S1, which could serve as a source for back pain. 4. **An incidental finding of potential clinical significance has been found. 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 Recommend followup by ultrasound in 5 years. This recommendation follows ACR consensus guidelines: White Paper of the ACR Incidental Findings Committee II on Vascular Findings. J Am Coll Radiol 2013; 10:789-794.** Electronically Signed   By: Jeannine Boga M.D.   On: 12/21/2016 21:28   Mr Lumbar Spine Wo Contrast  Result Date: 12/21/2016 CLINICAL DATA:  Initial evaluation for acute low back pain, history of osteoporosis, suspected compression fracture. EXAM: MRI THORACIC AND LUMBAR SPINE WITHOUT CONTRAST TECHNIQUE: Multiplanar and multiecho pulse sequences of  the thoracic and lumbar spine were obtained without intravenous contrast. COMPARISON:  None. FINDINGS: MRI THORACIC SPINE FINDINGS Alignment: Mild exaggeration of the normal thoracic kyphosis. No listhesis or subluxation. Vertebrae: There is abnormal linear T1 hypointense signal intensity with associated T2/STIR hyperintensity involving the T11 vertebral body, consistent with acute compression fracture. Associated height loss of up to 50% the with trace 2 mm bony retropulsion. This fracture is benign/ osteoporotic in appearance. Vertebral body heights are otherwise maintained. No other evidence for acute or chronic fracture. Signal intensity within the vertebral body bone marrow within normal limits. No worrisome osseous lesions. Few small benign hemangiomas noted. Cord:  Signal intensity within the thoracic spinal cord is normal. Paraspinal and other soft tissues: Paraspinous soft tissues within normal limits. Visualized visceral structures grossly unremarkable. Atelectatic changes present within the posterior right lung base. Disc levels: No significant degenerative changes seen within the thoracic spine. No significant disc bulge or focal disc protrusion. No canal or foraminal stenosis. MRI LUMBAR SPINE FINDINGS Segmentation: Normal segmentation. Lowest well-formed disc is labeled the L5-S1 level. Alignment: Mild exaggeration of the normal thoracic kyphosis. Trace retrolisthesis of L1 on L2 and L2 on L3. Vertebral bodies otherwise normally aligned. Vertebrae: Acute compression fracture involving the T11 vertebral body, described on thoracic spine portion of this report. Vertebral body heights are otherwise maintained. No other evidence for acute or chronic fracture within the lumbar spine. Signal intensity within the vertebral body bone marrow within normal limits. No discrete or worrisome osseous lesions. Conus medullaris: Extends to the L1 level and appears normal. Paraspinal and other soft tissues: Paraspinous  soft tissues within normal limits. Left kidney atrophic with scattered cysts. Infrarenal aorta ectatic measuring up to 2.6 cm. Disc levels: L1-2: Trace retrolisthesis of L1 on L2. Mild diffuse disc bulge with bilateral facet hypertrophy. No significant canal or foraminal stenosis. L2-3: Trace retrolisthesis of L2 on L3. Diffuse circumferential disc bulge. Moderate facet hypertrophy. Resultant mild to moderate canal with bilateral lateral recess stenosis, slightly greater on the left. No significant foraminal encroachment. L3-4: Minimal disc bulge. Moderate bilateral facet hypertrophy. Resultant mild canal and bilateral lateral recess stenosis. No significant foraminal encroachment. L4-5: Minimal annular disc bulge. Moderate bilateral facet arthrosis. Resultant mild to moderate  left lateral recess stenosis. No significant canal narrowing. Foramina are widely patent. L5-S1: Shallow posterior disc bulge. Moderate bilateral facet arthropathy. No significant stenosis. IMPRESSION: MR THORACIC SPINE IMPRESSION 1. Acute compression fracture involving the superior endplate of Y69 with associated 50% height loss with trace 2 mm bony retropulsion. No significant stenosis. 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 2. No other acute traumatic injury within the thoracic spine. MR LUMBAR SPINE IMPRESSION 1. No acute compression fracture or other abnormality within the lumbar spine. 2. 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 3. Moderate facet arthropathy at L4-5 and L5-S1, which could serve as a source for back pain. 4. **An incidental finding of potential clinical significance has been found. 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 Recommend followup by ultrasound in 5 years. This recommendation follows ACR consensus guidelines: White Paper of the ACR Incidental Findings Committee  II on Vascular Findings. J Am Coll Radiol 2013; 10:789-794.** Electronically Signed   By: Jeannine Boga M.D.   On: 12/21/2016 21:28   Pelvis Portable  Result Date: 12/22/2016 CLINICAL DATA:  Recent ORIF of right femoral fracture EXAM: PORTABLE PELVIS 1-2 VIEWS COMPARISON:  02/21/2017 FINDINGS: Postsurgical changes are again noted in the proximal right femur stable from the prior exam. Pelvic ring is intact. Degenerative changes of the lumbar spine are noted. No soft tissue changes are seen. IMPRESSION: Chronic changes in the pelvis.  No acute abnormality noted. Electronically Signed   By: Inez Catalina M.D.   On: 12/22/2016 21:34   Dg C-arm Gt 120 Min  Result Date: 12/22/2016 CLINICAL DATA:  ORIF of right femoral fracture EXAM: RIGHT FEMUR 2 VIEWS; DG C-ARM GT 120 MIN COMPARISON:  12/21/2016 FLUOROSCOPY TIME:  Fluoroscopy Time:  1 minutes 31 seconds Radiation Exposure Index (if provided by the fluoroscopic device): Not available Number of Acquired Spot Images: Sex FINDINGS: Fixation sideplate is noted along the distal femur with multiple fixation screws. Additionally the previously seen medullary rod remains in place. Multiple cerclage wires are noted as well. Fracture fragments are in near anatomic alignment. Removal of the previously placed screws in the lateral femoral condyles is noted. Bone cement is noted in the distal fracture site. IMPRESSION: ORIF of distal right femoral fracture Electronically Signed   By: Inez Catalina M.D.   On: 12/22/2016 21:21   Dg Hip Unilat With Pelvis 2-3 Views Right  Result Date: 12/21/2016 CLINICAL DATA:  Right leg pain following a fall 3 days ago. EXAM: DG HIP (WITH OR WITHOUT PELVIS) 2-3V RIGHT COMPARISON:  Right femur radiographs obtained at the same obtained. FINDINGS: Intramedullary rod and screw fixation of the proximal femur. Old, healed proximal femur fracture, laterally. No acute fracture or dislocation. Diffuse osteopenia. IMPRESSION: No right hip  fracture or dislocation. Electronically Signed   By: Claudie Revering M.D.   On: 12/21/2016 13:30   Dg Femur, Min 2 Views Right  Result Date: 12/22/2016 CLINICAL DATA:  ORIF of right femoral fracture EXAM: RIGHT FEMUR 2 VIEWS; DG C-ARM GT 120 MIN COMPARISON:  12/21/2016 FLUOROSCOPY TIME:  Fluoroscopy Time:  1 minutes 31 seconds Radiation Exposure Index (if provided by the fluoroscopic device): Not available Number of Acquired Spot Images: Sex FINDINGS: Fixation sideplate is noted along the distal femur with multiple fixation screws. Additionally the previously seen medullary rod remains in place. Multiple cerclage wires are noted as well. Fracture fragments are in near anatomic alignment.  Removal of the previously placed screws in the lateral femoral condyles is noted. Bone cement is noted in the distal fracture site. IMPRESSION: ORIF of distal right femoral fracture Electronically Signed   By: Inez Catalina M.D.   On: 12/22/2016 21:21   Dg Femur Min 2 Views Right  Result Date: 12/21/2016 CLINICAL DATA:  Right leg pain following a fall 3 days ago. EXAM: RIGHT FEMUR 2 VIEWS COMPARISON:  None. FINDINGS: Intramedullary variety extending the length of the femur with proximal and distal fixation screws. Mildly comminuted fracture of the distal femoral metaphysis with possible extension into the joint space. Posterior displacement and angulation of the distal fragments. Old, healed proximal femur fracture. Diffuse osteopenia. Moderate-sized effusion. Atheromatous arterial calcifications. IMPRESSION: Mildly comminuted fracture of the distal femoral metaphysis with possible extension into the joint space with an associated moderate-sized effusion. Electronically Signed   By: Claudie Revering M.D.   On: 12/21/2016 13:29     Not all labs, radiology exams or other studies done during hospitalization come through on my EPIC note; however they are reviewed by me.    Assessment and Plan  DISTAL RIGHT FEMORAL  FRACTURE/STATUS post ORIF-DVT prophylaxis with Lovenox, nonweightbearing 6-8 weeks, no bending, SNF - admitted for supportive care and OT/PT in preparation for another surgery in 6-8 weeks  ACUTE COMPRESSION FRACTURE T 11-per MRI/osteoporosis with associated 50% height loss with trace 2 mm bony retropulsion, no significant significant stenosis; possible candidate for kyphoplasty, to follow up with IR in 2-4 weeks SNF -continue vitamin plus D supplementation one by mouth daily; follow up IR appointment to be set up in 2-4 weeks  ACUTE  BLOOD LOSS ANEMIA POST OP -admission hemoglobin 9.7 discharge hemoglobin 8.8; no transfusion was required SNF - will follow-up CBC  SEVERE PROTEIN MALNUTRITION SNF - regular diet with with protein supplements and other supplements  DEPRESSION SNF - continue Paxil 40 mg by mouth daily  ESSENTIAL TREMOR SNF - controlled; continue Tenormin 12.5 mg by mouth daily  ALLERGIC/DRUG RASH SNF- noted on admission to skilled nursing facility, looks like a drug rash; since patient was on no antibiotics while in the hospital this is probably from her IV antibiotics before surgery; start Claritin 10 mg by mouth daily, Pepcid 20 mg by mouth daily and Vistaril 25 mg 3 times a day   Time spent greater than 45 minutes;> 50% of time with patient was spent reviewing records, labs, tests and studies, counseling and developing plan of care  Webb Silversmith D. Sheppard Coil, MD

## 2016-12-28 NOTE — Telephone Encounter (Signed)
This is a patient of [facility], who was admitted to [facility] after hospitalization. [facility] F/U is needed. Hospital discharge from [facility] on 12/25/16

## 2016-12-29 ENCOUNTER — Encounter: Payer: Self-pay | Admitting: Internal Medicine

## 2016-12-29 DIAGNOSIS — S22080D Wedge compression fracture of T11-T12 vertebra, subsequent encounter for fracture with routine healing: Secondary | ICD-10-CM | POA: Insufficient documentation

## 2016-12-29 DIAGNOSIS — L270 Generalized skin eruption due to drugs and medicaments taken internally: Secondary | ICD-10-CM | POA: Insufficient documentation

## 2016-12-29 DIAGNOSIS — E43 Unspecified severe protein-calorie malnutrition: Secondary | ICD-10-CM | POA: Insufficient documentation

## 2016-12-29 DIAGNOSIS — D62 Acute posthemorrhagic anemia: Secondary | ICD-10-CM | POA: Insufficient documentation

## 2016-12-29 DIAGNOSIS — T3795XA Adverse effect of unspecified systemic anti-infective and antiparasitic, initial encounter: Secondary | ICD-10-CM

## 2016-12-29 HISTORY — DX: Unspecified severe protein-calorie malnutrition: E43

## 2016-12-29 HISTORY — DX: Wedge compression fracture of t11-T12 vertebra, subsequent encounter for fracture with routine healing: S22.080D

## 2016-12-31 ENCOUNTER — Telehealth: Payer: Self-pay

## 2016-12-31 ENCOUNTER — Telehealth: Payer: Self-pay | Admitting: Internal Medicine

## 2016-12-31 NOTE — Telephone Encounter (Signed)
I spoke with medical assistant at [facility], patient is currently at the facility with no intended discharge date as of today.  Patient has a pending appointment with Dr.Carter on 01/06/17 (appointment may need to be cancelled)  Dr.Carter please advise if you will compose letter, if yes letter to be picked up tomorrow with patient's husband FL2

## 2016-12-31 NOTE — Telephone Encounter (Signed)
I called the pt to reschedule AWV, but there was no answer and no option to leave a message. VDM (DD)

## 2016-12-31 NOTE — Telephone Encounter (Signed)
Caren ([facility]) stopped by the office to request a letter similar to letter composed for patient's husband.  Letter to state patient is not able to make decisions regarding healthcare and financial matters.  It appears patient is currently @ [facility] had left the office once this was realized, patient last seen Dr.Carter on 07/29/16

## 2017-01-01 ENCOUNTER — Encounter: Payer: Self-pay | Admitting: Internal Medicine

## 2017-01-01 NOTE — Telephone Encounter (Signed)
Letter is done and printed.

## 2017-01-01 NOTE — Telephone Encounter (Signed)
Jody Moore ([facility]) left message on Clinical Intake stating that they need a letter for their step mother like you wrote for their father. Stated that they feel due to unsanitary living conditions, medication and self care she is unable to make decisions for herself or their father. Would like a letter written so they can take over her care.  Stated they would like to pick up today when picking up the [facility] form for the dad.

## 2017-01-04 ENCOUNTER — Other Ambulatory Visit: Payer: Medicare Other

## 2017-01-04 LAB — CBC AND DIFFERENTIAL
HCT: 30 — AB (ref 36–46)
Hemoglobin: 10 — AB (ref 12.0–16.0)
Platelets: 547 — AB (ref 150–399)
WBC: 11.7

## 2017-01-05 ENCOUNTER — Other Ambulatory Visit: Payer: Self-pay | Admitting: *Deleted

## 2017-01-06 ENCOUNTER — Encounter (INDEPENDENT_AMBULATORY_CARE_PROVIDER_SITE_OTHER): Payer: Self-pay | Admitting: Orthopedic Surgery

## 2017-01-06 ENCOUNTER — Encounter: Payer: Medicare Other | Admitting: Internal Medicine

## 2017-01-06 ENCOUNTER — Ambulatory Visit (INDEPENDENT_AMBULATORY_CARE_PROVIDER_SITE_OTHER): Payer: Medicare Other | Admitting: Orthopedic Surgery

## 2017-01-06 ENCOUNTER — Ambulatory Visit: Payer: Medicare Other | Admitting: Internal Medicine

## 2017-01-06 ENCOUNTER — Ambulatory Visit (INDEPENDENT_AMBULATORY_CARE_PROVIDER_SITE_OTHER): Payer: Medicare Other

## 2017-01-06 DIAGNOSIS — S728X1D Other fracture of right femur, subsequent encounter for closed fracture with routine healing: Secondary | ICD-10-CM

## 2017-01-06 NOTE — Progress Notes (Signed)
Post-Op Visit Note   Patient: Jody Moore           Date of Birth: July 06, 1940           MRN: [id number] Visit Date: 01/06/2017 PCP: Gildardo Cranker, DO   Assessment & Plan:  Chief Complaint:  Chief Complaint  Patient presents with  . Right Hip - Routine Post Op   Visit Diagnoses:  1. Other closed fracture of right femur with routine healing, unspecified portion of femur, subsequent encounter     Plan: [place] is now 2 weeks out open reduction internal fixation right femur fracture.  On exam the incision is well-healed.  She's been nonweightbearing.  She has pretty decent range of motion of the knee to about 50 or 60.  Radiographs showed good alignment of the fracture and incorporation of the bone graft.  Plan at this time is nonweightbearing 4 more weeks range of motion of the knee 0-40 okay.  Also okay for quad strengthening exercises.  Okay to discontinue the knee immobilizer after 2 weeks.  Follow-Up Instructions: No Follow-up on file.   Orders:  Orders Placed This Encounter  Procedures  . XR FEMUR, MIN 2 VIEWS RIGHT   No orders of the defined types were placed in this encounter.   Imaging: Xr Femur, Min 2 Views Right  Result Date: 01/06/2017 AP lateral right femur reviewed.  Plate fixation distal femur fracture in good position and alignment.  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  An graft early consolidation appears to be occurring.  No hardware complications.   PMFS History: Patient Active Problem List   Diagnosis Date Noted  . Traumatic compression fracture of T11 thoracic vertebra, with routine healing, subsequent encounter 12/29/2016  . Acute blood loss as cause of postoperative anemia 12/29/2016  . Severe protein-calorie malnutrition ([place]) 12/29/2016  . Allergic drug rash due to anti-infective agent 12/29/2016  . Femur fracture ([place]) 12/22/2016  . Femur fracture, right ([place]) 12/21/2016  . Prediabetes 07/29/2016  . Situational anxiety 07/29/2016  .  Gastroesophageal reflux disease without esophagitis 07/10/2015  . Severe episode of recurrent major depressive disorder, without psychotic features ([place]) 07/10/2015  . Osteoporosis 07/10/2015  . 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 Essential tremor 10/04/2013  . 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 Cervical cancer ([place])   . Depression   . Essential tremor 10/04/2013  . Femur fracture, right ([place]) 12/21/2016  . Gastroesophageal reflux disease without esophagitis 07/10/2015  . 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 Osteoporosis   . Osteoporosis 07/10/2015  . 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 Reflux   . Severe episode of recurrent major depressive disorder, without psychotic features ([place]) 07/10/2015  . Situational anxiety 07/29/2016    Family History  Problem Relation Age of Onset  . Heart disease Father     Past Surgical History:  Procedure Laterality Date  . ANKLE SURGERY    . CATARACT EXTRACTION    . [place]   cancer-radiation   . FEMUR SURGERY  2005   fractured right femur   . HIP SURGERY  2006   fractured right hip   . ORIF FEMUR FRACTURE Right 12/22/2016   Procedure: OPEN REDUCTION INTERNAL FIXATION (ORIF) DISTAL FEMUR FRACTURE;  Surgeon: [name], MD;  Location: [facility];  Service: Orthopedics;  Laterality: Right;  . TONSILLECTOMY     Social History   Occupational History  .  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 Smoking status: Former Smoker    Packs/day: 2.00    Types: Cigarettes    Quit date: 06/08/2013  . Smokeless tobacco: Never Used  . Alcohol use Yes     Comment: 3-4 glasses wine/day  . Drug use: No  . Sexual activity: Not on file

## 2017-01-13 ENCOUNTER — Non-Acute Institutional Stay (SKILLED_NURSING_FACILITY): Payer: Medicare Other | Admitting: Internal Medicine

## 2017-01-13 DIAGNOSIS — R4189 Other symptoms and signs involving cognitive functions and awareness: Secondary | ICD-10-CM

## 2017-01-13 DIAGNOSIS — S72491D Other fracture of lower end of right femur, subsequent encounter for closed fracture with routine healing: Secondary | ICD-10-CM

## 2017-01-13 DIAGNOSIS — E43 Unspecified severe protein-calorie malnutrition: Secondary | ICD-10-CM

## 2017-01-14 ENCOUNTER — Other Ambulatory Visit: Payer: Self-pay | Admitting: *Deleted

## 2017-01-14 ENCOUNTER — Encounter: Payer: Self-pay | Admitting: Internal Medicine

## 2017-01-14 ENCOUNTER — Ambulatory Visit
Admission: RE | Admit: 2017-01-14 | Discharge: 2017-01-14 | Disposition: A | Discharge status: Home / Self Care | Payer: Medicare Other | Source: Ambulatory Visit | Attending: Student | Admitting: Student

## 2017-01-14 DIAGNOSIS — M4850XA Collapsed vertebra, not elsewhere classified, site unspecified, initial encounter for fracture: Secondary | ICD-10-CM

## 2017-01-14 DIAGNOSIS — M549 Dorsalgia, unspecified: Secondary | ICD-10-CM | POA: Diagnosis not present

## 2017-01-14 DIAGNOSIS — S22080A Wedge compression fracture of T11-T12 vertebra, initial encounter for closed fracture: Secondary | ICD-10-CM | POA: Diagnosis not present

## 2017-01-14 HISTORY — PX: IR RADIOLOGIST EVAL & MGMT: IMG5224

## 2017-01-14 NOTE — Progress Notes (Signed)
Location:  [facility] Room Number: [place]:  SNF [phone])  Provider: Noah Delaine. Sheppard Coil, MD  Gildardo Cranker, DO  Patient Care Team: Gildardo Cranker, DO as PCP - General (Internal Medicine)  Extended Emergency Contact Information Primary Emergency Contact: [facility] Address: [address] [place], [place] [address] Johnnette Litter of [place] Phone: [phone] Mobile Phone: [phone] Relation: Spouse Secondary Emergency Contact: Hadley Pen Address: [address] [place] [address] Johnnette Litter of [place] Phone: [phone] Mobile Phone: [phone] Relation: Sister    Allergies: Influenza vaccines; Omeprazole; and Coumadin [warfarin sodium]  Chief Complaint  Patient presents with  . Acute Visit    HPI: Patient is 76 y.o. female who Is being seen today because patient's 20 days or almost up and family is wanting my opinion as to whether patient will be able to go home, go to an ALF or needs to stay in skilled nursing. Patient herself would very much like to go home with her sister.  Past Medical History:  Diagnosis Date  . Cervical cancer ([place])   . Depression   . Essential tremor 10/04/2013  . Femur fracture ([place]) 12/22/2016  . Femur fracture, right ([place]) 12/21/2016  . Gastroesophageal reflux disease without esophagitis 07/10/2015  . 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 Osteoporosis   . Osteoporosis 07/10/2015  . 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 Reflux   . Severe episode of recurrent major depressive disorder, without psychotic features ([place]) 07/10/2015  . Severe protein-calorie malnutrition ([place]) 12/29/2016  . Situational anxiety 07/29/2016  . Traumatic compression fracture of T11 thoracic vertebra, with routine healing, subsequent encounter 12/29/2016    Past Surgical History:  Procedure Laterality Date  . ANKLE SURGERY    . CATARACT EXTRACTION    . [place]   cancer-radiation   . FEMUR SURGERY  2005   fractured right femur   . HIP SURGERY  2006   fractured right hip   . ORIF FEMUR FRACTURE Right 12/22/2016   Procedure: OPEN REDUCTION INTERNAL FIXATION (ORIF) DISTAL FEMUR FRACTURE;  Surgeon: [name], MD;  Location: [facility];  Service: Orthopedics;  Laterality: Right;  . TONSILLECTOMY      Allergies as of 01/13/2017      Reactions   Influenza Vaccines Hives   Hives/swelling/injection site war to the touch. No relief with benadryl    Omeprazole Hives   Coumadin [warfarin Sodium] Rash      Medication List       Accurate as of 01/13/17 11:59 PM. Always use your most recent med list.          acetaminophen-codeine 300-30 MG tablet Commonly known as:  TYLENOL #3 Take 1 tablet by mouth every 6 (six) hours as needed for moderate pain.   atenolol 25 MG tablet Commonly known as:  TENORMIN Take 0.5 tablets (12.5 mg total) by mouth daily.   CALCIUM + D PO Take 1 tablet by mouth daily.   enoxaparin 30 MG/0.3ML injection Commonly known as:  LOVENOX Inject 0.3 mLs (30 mg total) into the skin daily.   feeding supplement (ENSURE ENLIVE) Liqd Take 237 mLs by mouth 2 (two) times daily between meals.   menthol-cetylpyridinium 3 MG lozenge Commonly known as:  CEPACOL Take 1 lozenge by mouth. Take 1 lozenge every 2 hours as needed for sore throat  multivitamin tablet Take 1 tablet by mouth daily.   PARoxetine 40 MG tablet Commonly known as:  PAXIL TAKE 1 TABLET(40 MG) BY MOUTH DAILY   QUEtiapine 25 MG tablet Commonly known as:  SEROQUEL Take 25 mg by mouth. Take one tablet twice daily       Meds ordered this encounter  Medications  . QUEtiapine (SEROQUEL) 25 MG tablet    Sig: Take 25 mg by mouth. Take one tablet twice daily    Immunization History  Administered Date(s) Administered  . Influenza,inj,Quad PF,36+ Mos 07/10/2015  . PPD Test 12/25/2016  . Pneumococcal Polysaccharide-23 07/10/2015  . Tdap  12/21/2016    Social History  Substance Use Topics  . Smoking status: Former Smoker    Packs/day: 2.00    Types: Cigarettes    Quit date: 06/08/2013  . Smokeless tobacco: Never Used  . Alcohol use Yes     Comment: 3-4 glasses wine/day    Review of Systems  DATA OBTAINED: from patient, nurse, PT, ST GENERAL:  no fevers, fatigue, appetite changes SKIN: No itching, rash HEENT: No complaint RESPIRATORY: No cough, wheezing, SOB CARDIAC: No chest pain, palpitations, lower extremity edema  GI: No abdominal pain, No N/V/D or constipation, No heartburn or reflux  GU: No dysuria, frequency or urgency, or incontinence  MUSCULOSKELETAL: No unrelieved bone/joint pain NEUROLOGIC: No headache, dizziness  PSYCHIATRIC: No overt anxiety or sadness  Vitals:   01/13/17 1026  BP: 123/70  Pulse: 74  Resp: 18  Temp: 97.8 F (36.6 C)  SpO2: 91%   Body mass index is 19.31 kg/m. Physical Exam  GENERAL APPEARANCE: Alert, conversant, No acute distress ;Thin white female SKIN: No diaphoresis rash HEENT: Unremarkable RESPIRATORY: Breathing is even, unlabored. Lung sounds are clear   CARDIOVASCULAR: Heart RRR no murmurs, rubs or gallops. No peripheral edema  GASTROINTESTINAL: Abdomen is soft, non-tender, not distended w/ normal bowel sounds.  GENITOURINARY: Bladder non tender, not distended  MUSCULOSKELETAL: Splint  on right leg/knee  NEUROLOG presentIC: Cranial nerves 2-12 grossly intact. Moves all extremities PSYCHIATRIC: Mood and affect appropriate to situation, no behavioral issues  Patient Active Problem List   Diagnosis Date Noted  . Traumatic compression fracture of T11 thoracic vertebra, with routine healing, subsequent encounter 12/29/2016  . Acute blood loss as cause of postoperative anemia 12/29/2016  . Severe protein-calorie malnutrition ([place]) 12/29/2016  . Allergic drug rash due to anti-infective agent 12/29/2016  . Femur fracture ([place]) 12/22/2016  . Femur fracture, right ([place])  12/21/2016  . Prediabetes 07/29/2016  . Situational anxiety 07/29/2016  . Gastroesophageal reflux disease without esophagitis 07/10/2015  . Severe episode of recurrent major depressive disorder, without psychotic features ([place]) 07/10/2015  . Osteoporosis 07/10/2015  . 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 Essential tremor 10/04/2013  . 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    CMP     Component Value Date/Time   NA 137 12/24/2016 0414   NA 136 10/09/2015 1607   K 4.3 12/24/2016 0414   CL 108 12/24/2016 0414   CO2 21 (L) 12/24/2016 0414   GLUCOSE 117 (H) 12/24/2016 0414   BUN 9 12/24/2016 0414   BUN 10 10/09/2015 1607   CREATININE 0.49 12/24/2016 0414   CREATININE 0.76 07/29/2016 1144   CALCIUM 8.4 (L) 12/24/2016 0414   PROT 4.8 (L) 12/24/2016 0414   PROT 6.6 10/09/2015 1607   ALBUMIN 2.3 (L) 12/24/2016 0414   ALBUMIN 3.2 (L) 10/09/2015 1607   AST 45 (H) 12/24/2016 0414   ALT  11 (L) 12/24/2016 0414   ALKPHOS 53 12/24/2016 0414   BILITOT 0.8 12/24/2016 0414   BILITOT 0.7 10/09/2015 1607   GFRNONAA >60 12/24/2016 0414   GFRNONAA 77 07/29/2016 1144   GFRAA >60 12/24/2016 0414   GFRAA 89 07/29/2016 1144    Recent Labs  12/22/16 0546 12/23/16 0253 12/24/16 0414  NA 139 137 137  K 3.3* 5.5* 4.3  CL 107 107 108  CO2 23 18* 21*  GLUCOSE 101* 135* 117*  BUN 16 11 9   CREATININE 0.59 0.70 0.49  CALCIUM 8.5* 8.2* 8.4*  MG 1.6*  --   --     Recent Labs  12/21/16 1232 12/22/16 0546 12/24/16 0414  AST 71* 55* 45*  ALT 30 28 11*  ALKPHOS 73 60 53  BILITOT 0.8 1.0 0.8  PROT 6.6 5.6* 4.8*  ALBUMIN 3.2* 2.6* 2.3*    Recent Labs  12/22/16 0546 12/23/16 0253 12/24/16 0414 12/25/16 0309 01/04/17  WBC 6.5 9.7 7.6 8.1 11.7  NEUTROABS 3.7 9.0*  --   --   --   HGB 9.0* 9.6* 8.6* 8.8* 10.0*  HCT 27.1* 29.6* 26.3* 26.8* 30*  MCV 93.1 95.5 94.3 94.0  --   PLT 291 297 321 398 547*   No results for input(s): CHOL, LDLCALC, TRIG in the last 8760  hours.  Invalid input(s): HCL No results found for: [facility] Lab Results  Component Value Date   TSH 1.072 12/23/2016   Lab Results  Component Value Date   HGBA1C 5.9 (H) 10/09/2015   Lab Results  Component Value Date   CHOL 184 10/09/2015   HDL 87 10/09/2015   LDLCALC 76 10/09/2015   TRIG 107 10/09/2015   CHOLHDL 2.1 10/09/2015    Significant Diagnostic Results in last 30 days:  Dg Chest 2 View  Result Date: 12/21/2016 CLINICAL DATA:  [place] 3 days ago.  No reported chest symptoms. EXAM: CHEST  2 VIEW COMPARISON:  08/13/2005. FINDINGS: Normal sized heart. Mild linear density at both lung bases. Approximately 50% lower thoracic vertebral compression deformity. No visible acute fracture lines. No previous lateral view for comparison. IMPRESSION: 1. Approximately 50% lower thoracic vertebral compression deformity, age indeterminate. 2. Mild bibasilar linear atelectasis or scarring. Electronically Signed   By: Claudie Revering M.D.   On: 12/21/2016 13:26   Dg Knee 1-2 Views Right  Result Date: 12/24/2016 CLINICAL DATA:  Distal femur fracture.  Postoperative redness. EXAM: RIGHT KNEE - 1-2 VIEW COMPARISON:  Radiography from 3 days prior. Fluoroscopy from 2 days ago. FINDINGS: Interval lateral plate and screw fixation of a distal femur fracture involving metaphysis and intercondylar region. Bone cement was placed at the metaphysis. No new/operative related fracture is noted. Hardware appears well seated. Normal alignment. Nonspecific soft tissue swelling and joint gas after recent surgery. Osteopenia. IMPRESSION: Stable postoperative changes.  No acute superimposed finding. Electronically Signed   By: Monte Fantasia M.D.   On: 12/24/2016 09:12   Dg Ankle 2 Views Right  Result Date: 12/21/2016 CLINICAL DATA:  Remote right ankle fracture.  Ankle now on stable. EXAM: RIGHT ANKLE - 2 VIEW COMPARISON:  None. FINDINGS: Two views study shows plate and screw fixation of the distal tibia with  persistent apex posterior angulation. Degenerative changes are noted at the tibiotalar joint. Relationship of the distal fibula to the ankle mortise not well demonstrated on this study. Bones are diffusely demineralized. IMPRESSION: 1. Posttraumatic deformity of the distal tibia with evidence of ORIF. 2. Osteoarthritis of the tibiotalar joint.  Electronically Signed   By: Misty Stanley M.D.   On: 12/21/2016 18:54   Ct Head Wo Contrast  Result Date: 12/21/2016 CLINICAL DATA:  Fall 3 days ago EXAM: CT HEAD WITHOUT CONTRAST CT CERVICAL SPINE WITHOUT CONTRAST TECHNIQUE: Multidetector CT imaging of the head and cervical spine was performed following the standard protocol without intravenous contrast. Multiplanar CT image reconstructions of the cervical spine were also generated. COMPARISON:  None. FINDINGS: CT HEAD FINDINGS Brain: There are mild chronic ischemic changes in the periventricular white matter. No mass effect, midline shift, or acute hemorrhage. Mild global atrophy appropriate to age. Vascular: No hyperdense vessel or unexpected calcification. Skull: Cranium is intact. Sinuses/Orbits: Visualized paranasal sinuses and mastoid air cells are clear. Other: Noncontributory CT CERVICAL SPINE FINDINGS Alignment: There is anterolisthesis C4 upon C5 which is related to facet arthropathy. There is reversed cervical lordosis. Skull base and vertebrae: No acute fracture.  No dislocation. Soft tissues and spinal canal: No evidence of spinal hematoma or soft tissue injury. Disc levels: There is severe narrowing of the C5-6 and C6-7 discs with posterior osteophytic ridging. Spinal canal is grossly patent. Upper chest: Emphysema.  No lung mass. Other: Thyroid is unremarkable. IMPRESSION: No acute intracranial pathology. No evidence of cervical spine injury. Chronic changes. Electronically Signed   By: Marybelle Killings M.D.   On: 12/21/2016 13:39   Ct Cervical Spine Wo Contrast  Result Date: 12/21/2016 CLINICAL DATA:  Fall  3 days ago EXAM: CT HEAD WITHOUT CONTRAST CT CERVICAL SPINE WITHOUT CONTRAST TECHNIQUE: Multidetector CT imaging of the head and cervical spine was performed following the standard protocol without intravenous contrast. Multiplanar CT image reconstructions of the cervical spine were also generated. COMPARISON:  None. FINDINGS: CT HEAD FINDINGS Brain: There are mild chronic ischemic changes in the periventricular white matter. No mass effect, midline shift, or acute hemorrhage. Mild global atrophy appropriate to age. Vascular: No hyperdense vessel or unexpected calcification. Skull: Cranium is intact. Sinuses/Orbits: Visualized paranasal sinuses and mastoid air cells are clear. Other: Noncontributory CT CERVICAL SPINE FINDINGS Alignment: There is anterolisthesis C4 upon C5 which is related to facet arthropathy. There is reversed cervical lordosis. Skull base and vertebrae: No acute fracture.  No dislocation. Soft tissues and spinal canal: No evidence of spinal hematoma or soft tissue injury. Disc levels: There is severe narrowing of the C5-6 and C6-7 discs with posterior osteophytic ridging. Spinal canal is grossly patent. Upper chest: Emphysema.  No lung mass. Other: Thyroid is unremarkable. IMPRESSION: No acute intracranial pathology. No evidence of cervical spine injury. Chronic changes. Electronically Signed   By: Marybelle Killings M.D.   On: 12/21/2016 13:39   Ct Knee Right Wo Contrast  Result Date: 12/21/2016 CLINICAL DATA:  Acute distal femur fracture. EXAM: CT OF THE RIGHT KNEE WITHOUT CONTRAST TECHNIQUE: Multidetector CT imaging of the right knee was performed according to the standard protocol. Multiplanar CT image reconstructions were also generated. COMPARISON:  Radiographs dated 12/21/2016 FINDINGS: Bones/Joint/Cartilage There is an old healed fracture of the distal right femoral shaft with an intramedullary nail in place. There is an old fracture of the lateral femoral condyle with 2 screws in place in  the lateral femoral condyle. There is an acute impacted transverse fracture of the metaphysis of the distal right femur. There are no acute vertical fractures through the femoral condyles or intertrochanteric notch. IMPRESSION: Impacted transverse fracture of the metaphysis of the distal right femur. Electronically Signed   By: Lorriane Shire M.D.   On: 12/21/2016 16:26  Mr Thoracic Spine Wo Contrast  Result Date: 12/21/2016 CLINICAL DATA:  Initial evaluation for acute low back pain, history of osteoporosis, suspected compression fracture. EXAM: MRI THORACIC AND LUMBAR SPINE WITHOUT CONTRAST TECHNIQUE: Multiplanar and multiecho pulse sequences of the thoracic and lumbar spine were obtained without intravenous contrast. COMPARISON:  None. FINDINGS: MRI THORACIC SPINE FINDINGS Alignment: Mild exaggeration of the normal thoracic kyphosis. No listhesis or subluxation. Vertebrae: There is abnormal linear T1 hypointense signal intensity with associated T2/STIR hyperintensity involving the T11 vertebral body, consistent with acute compression fracture. Associated height loss of up to 50% the with trace 2 mm bony retropulsion. This fracture is benign/ osteoporotic in appearance. Vertebral body heights are otherwise maintained. No other evidence for acute or chronic fracture. Signal intensity within the vertebral body bone marrow within normal limits. No worrisome osseous lesions. Few small benign hemangiomas noted. Cord:  Signal intensity within the thoracic spinal cord is normal. Paraspinal and other soft tissues: Paraspinous soft tissues within normal limits. Visualized visceral structures grossly unremarkable. Atelectatic changes present within the posterior right lung base. Disc levels: No significant degenerative changes seen within the thoracic spine. No significant disc bulge or focal disc protrusion. No canal or foraminal stenosis. MRI LUMBAR SPINE FINDINGS Segmentation: Normal segmentation. Lowest well-formed  disc is labeled the L5-S1 level. Alignment: Mild exaggeration of the normal thoracic kyphosis. Trace retrolisthesis of L1 on L2 and L2 on L3. Vertebral bodies otherwise normally aligned. Vertebrae: Acute compression fracture involving the T11 vertebral body, described on thoracic spine portion of this report. Vertebral body heights are otherwise maintained. No other evidence for acute or chronic fracture within the lumbar spine. Signal intensity within the vertebral body bone marrow within normal limits. No discrete or worrisome osseous lesions. Conus medullaris: Extends to the L1 level and appears normal. Paraspinal and other soft tissues: Paraspinous soft tissues within normal limits. Left kidney atrophic with scattered cysts. Infrarenal aorta ectatic measuring up to 2.6 cm. Disc levels: L1-2: Trace retrolisthesis of L1 on L2. Mild diffuse disc bulge with bilateral facet hypertrophy. No significant canal or foraminal stenosis. L2-3: Trace retrolisthesis of L2 on L3. Diffuse circumferential disc bulge. Moderate facet hypertrophy. Resultant mild to moderate canal with bilateral lateral recess stenosis, slightly greater on the left. No significant foraminal encroachment. L3-4: Minimal disc bulge. Moderate bilateral facet hypertrophy. Resultant mild canal and bilateral lateral recess stenosis. No significant foraminal encroachment. L4-5: Minimal annular disc bulge. Moderate bilateral facet arthrosis. Resultant mild to moderate left lateral recess stenosis. No significant canal narrowing. Foramina are widely patent. L5-S1: Shallow posterior disc bulge. Moderate bilateral facet arthropathy. No significant stenosis. IMPRESSION: MR THORACIC SPINE IMPRESSION 1. Acute compression fracture involving the superior endplate of J68 with associated 50% height loss with trace 2 mm bony retropulsion. No significant stenosis. 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 2. No other acute traumatic injury within the thoracic spine. MR  LUMBAR SPINE IMPRESSION 1. No acute compression fracture or other abnormality within the lumbar spine. 2. 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 3. Moderate facet arthropathy at L4-5 and L5-S1, which could serve as a source for back pain. 4. **An incidental finding of potential clinical significance has been found. 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 Recommend followup by ultrasound in 5 years. This recommendation follows ACR consensus guidelines: White Paper of the ACR Incidental Findings Committee II on Vascular Findings. J Am Coll Radiol 2013;  01:751-025.** Electronically Signed   By: Jeannine Boga M.D.   On: 12/21/2016 21:28   Mr Lumbar Spine Wo Contrast  Result Date: 12/21/2016 CLINICAL DATA:  Initial evaluation for acute low back pain, history of osteoporosis, suspected compression fracture. EXAM: MRI THORACIC AND LUMBAR SPINE WITHOUT CONTRAST TECHNIQUE: Multiplanar and multiecho pulse sequences of the thoracic and lumbar spine were obtained without intravenous contrast. COMPARISON:  None. FINDINGS: MRI THORACIC SPINE FINDINGS Alignment: Mild exaggeration of the normal thoracic kyphosis. No listhesis or subluxation. Vertebrae: There is abnormal linear T1 hypointense signal intensity with associated T2/STIR hyperintensity involving the T11 vertebral body, consistent with acute compression fracture. Associated height loss of up to 50% the with trace 2 mm bony retropulsion. This fracture is benign/ osteoporotic in appearance. Vertebral body heights are otherwise maintained. No other evidence for acute or chronic fracture. Signal intensity within the vertebral body bone marrow within normal limits. No worrisome osseous lesions. Few small benign hemangiomas noted. Cord:  Signal intensity within the thoracic spinal cord is normal. Paraspinal and other  soft tissues: Paraspinous soft tissues within normal limits. Visualized visceral structures grossly unremarkable. Atelectatic changes present within the posterior right lung base. Disc levels: No significant degenerative changes seen within the thoracic spine. No significant disc bulge or focal disc protrusion. No canal or foraminal stenosis. MRI LUMBAR SPINE FINDINGS Segmentation: Normal segmentation. Lowest well-formed disc is labeled the L5-S1 level. Alignment: Mild exaggeration of the normal thoracic kyphosis. Trace retrolisthesis of L1 on L2 and L2 on L3. Vertebral bodies otherwise normally aligned. Vertebrae: Acute compression fracture involving the T11 vertebral body, described on thoracic spine portion of this report. Vertebral body heights are otherwise maintained. No other evidence for acute or chronic fracture within the lumbar spine. Signal intensity within the vertebral body bone marrow within normal limits. No discrete or worrisome osseous lesions. Conus medullaris: Extends to the L1 level and appears normal. Paraspinal and other soft tissues: Paraspinous soft tissues within normal limits. Left kidney atrophic with scattered cysts. Infrarenal aorta ectatic measuring up to 2.6 cm. Disc levels: L1-2: Trace retrolisthesis of L1 on L2. Mild diffuse disc bulge with bilateral facet hypertrophy. No significant canal or foraminal stenosis. L2-3: Trace retrolisthesis of L2 on L3. Diffuse circumferential disc bulge. Moderate facet hypertrophy. Resultant mild to moderate canal with bilateral lateral recess stenosis, slightly greater on the left. No significant foraminal encroachment. L3-4: Minimal disc bulge. Moderate bilateral facet hypertrophy. Resultant mild canal and bilateral lateral recess stenosis. No significant foraminal encroachment. L4-5: Minimal annular disc bulge. Moderate bilateral facet arthrosis. Resultant mild to moderate left lateral recess stenosis. No significant canal narrowing. Foramina are  widely patent. L5-S1: Shallow posterior disc bulge. Moderate bilateral facet arthropathy. No significant stenosis. IMPRESSION: MR THORACIC SPINE IMPRESSION 1. Acute compression fracture involving the superior endplate of E52 with associated 50% height loss with trace 2 mm bony retropulsion. No significant stenosis. 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 2. No other acute traumatic injury within the thoracic spine. MR LUMBAR SPINE IMPRESSION 1. No acute compression fracture or other abnormality within the lumbar spine. 2. 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 3. Moderate facet arthropathy at L4-5 and L5-S1, which could serve as a source for back pain. 4. **An incidental finding of potential clinical significance has been found. 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 Recommend followup by ultrasound in 5 years. This recommendation follows ACR consensus guidelines:  White Paper of the ACR Incidental Findings Committee II on Vascular Findings. J Am Coll Radiol 2013; 10:789-794.** Electronically Signed   By: Jeannine Boga M.D.   On: 12/21/2016 21:28   Pelvis Portable  Result Date: 12/22/2016 CLINICAL DATA:  Recent ORIF of right femoral fracture EXAM: PORTABLE PELVIS 1-2 VIEWS COMPARISON:  02/21/2017 FINDINGS: Postsurgical changes are again noted in the proximal right femur stable from the prior exam. Pelvic ring is intact. Degenerative changes of the lumbar spine are noted. No soft tissue changes are seen. IMPRESSION: Chronic changes in the pelvis.  No acute abnormality noted. Electronically Signed   By: Inez Catalina M.D.   On: 12/22/2016 21:34   Dg C-arm Gt 120 Min  Result Date: 12/22/2016 CLINICAL DATA:  ORIF of right femoral fracture EXAM: RIGHT FEMUR 2 VIEWS; DG C-ARM GT 120 MIN COMPARISON:  12/21/2016 FLUOROSCOPY TIME:  Fluoroscopy Time:  1 minutes  31 seconds Radiation Exposure Index (if provided by the fluoroscopic device): Not available Number of Acquired Spot Images: Sex FINDINGS: Fixation sideplate is noted along the distal femur with multiple fixation screws. Additionally the previously seen medullary rod remains in place. Multiple cerclage wires are noted as well. Fracture fragments are in near anatomic alignment. Removal of the previously placed screws in the lateral femoral condyles is noted. Bone cement is noted in the distal fracture site. IMPRESSION: ORIF of distal right femoral fracture Electronically Signed   By: Inez Catalina M.D.   On: 12/22/2016 21:21   Dg Hip Unilat With Pelvis 2-3 Views Right  Result Date: 12/21/2016 CLINICAL DATA:  Right leg pain following a fall 3 days ago. EXAM: DG HIP (WITH OR WITHOUT PELVIS) 2-3V RIGHT COMPARISON:  Right femur radiographs obtained at the same obtained. FINDINGS: Intramedullary rod and screw fixation of the proximal femur. Old, healed proximal femur fracture, laterally. No acute fracture or dislocation. Diffuse osteopenia. IMPRESSION: No right hip fracture or dislocation. Electronically Signed   By: Claudie Revering M.D.   On: 12/21/2016 13:30   Dg Femur, Min 2 Views Right  Result Date: 12/22/2016 CLINICAL DATA:  ORIF of right femoral fracture EXAM: RIGHT FEMUR 2 VIEWS; DG C-ARM GT 120 MIN COMPARISON:  12/21/2016 FLUOROSCOPY TIME:  Fluoroscopy Time:  1 minutes 31 seconds Radiation Exposure Index (if provided by the fluoroscopic device): Not available Number of Acquired Spot Images: Sex FINDINGS: Fixation sideplate is noted along the distal femur with multiple fixation screws. Additionally the previously seen medullary rod remains in place. Multiple cerclage wires are noted as well. Fracture fragments are in near anatomic alignment. Removal of the previously placed screws in the lateral femoral condyles is noted. Bone cement is noted in the distal fracture site. IMPRESSION: ORIF of distal right femoral  fracture Electronically Signed   By: Inez Catalina M.D.   On: 12/22/2016 21:21   Dg Femur Min 2 Views Right  Result Date: 12/21/2016 CLINICAL DATA:  Right leg pain following a fall 3 days ago. EXAM: RIGHT FEMUR 2 VIEWS COMPARISON:  None. FINDINGS: Intramedullary variety extending the length of the femur with proximal and distal fixation screws. Mildly comminuted fracture of the distal femoral metaphysis with possible extension into the joint space. Posterior displacement and angulation of the distal fragments. Old, healed proximal femur fracture. Diffuse osteopenia. Moderate-sized effusion. Atheromatous arterial calcifications. IMPRESSION: Mildly comminuted fracture of the distal femoral metaphysis with possible extension into the joint space with an associated moderate-sized effusion. Electronically Signed   By: Claudie Revering M.D.   On: 12/21/2016  13:29   Xr Femur, Min 2 Views Right  Result Date: 01/06/2017 AP lateral right femur reviewed.  Plate fixation distal femur fracture in good position and alignment.  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  An graft early consolidation appears to be occurring.  No hardware complications.   Assessment and Plan  RIGHT DISTAL FEMUR FRACTURE/protein malnutrition/cognitive impairment- I have spoken with anyone in the facilities had any impact or exposure to the patient including nursing/PT/speech therapy. multiple issues are involved here but the major ones are that patient has not been cleared for full weight-bearing, therefore a lot of her work is to be done and the other being her cognitive abilities. Patient scored a 21 out of 30 on her mocha but all of her lost points for due to short-term memory recall so it is felt that eventually patient would be able to operate and ALF but not now. Patient needs assist with transferring , per PT does not have enough upper body strength to compensate for no weightbearing on right leg and needs prompts due to her memory problems;  going home is simply not a possibility. She needs skilled strong people to help her at this stage of her recovery. I have spoken with the patient about this at length and while disappointed she understands.  Later entry-family has decided to leave [facility] at skilled nursing facility; I think this is a good decision      time spent greater than 35 minutes  Jemia Fata D. Sheppard Coil, MD

## 2017-01-14 NOTE — Consult Note (Signed)
Chief Complaint: Symptomatic compression fracture  Referring Physician(s): Florencia Reasons  History of Present Illness: Jody Moore is a 76 y.o. female with past medical history significant for cervical cancer, macular degeneration of both eyes, severe protein calorie malnutrition and osteoporosis who fell at home on July 16 suffering a comminuted right femur fracture for which she ultimately underwent ORIF fixation. Workup in the hospital also entailed a thoracic and lumbar spine MRI which demonstrated a moderate (approximately 40%) compression deformity involving the superior endplate of the S34 vertebral body. Patient presents today to the interventional radiology clinic for evaluation of potential fluoroscopic guided kyphoplasty. Note, she was initially seen in consultation while an inpatient at the hospital on 12/22/2016 however intervention was not pursued at that time secondary to the patient's impending femur fracture surgery. The patient is unaccompanied though serves as her own historian.  The patient is currently in a skilled nursing facility where she is undergoing rehabilitation. Patient reports her back pain as mild. She states her pain is between 2-3/10 at rest though worsens to 4-5/10 with activity and with participation during rehabilitation. She states that her back pain does not in her interfere with her ability to participate with rehabilitation. She denies radicular symptoms. No change in bowel or bladder functions. No fever or chills.  Note, the patient is currently on Xarelto for DVT prophylactic purposes.    Past Medical History:  Diagnosis Date  . Cervical cancer ([place])   . Depression   . Essential tremor 10/04/2013  . Femur fracture ([place]) 12/22/2016  . Femur fracture, right ([place]) 12/21/2016  . Gastroesophageal reflux disease without esophagitis 07/10/2015  . 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 Osteoporosis   . Osteoporosis 07/10/2015  . 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 Reflux   . Severe episode of recurrent major depressive disorder, without psychotic features ([place]) 07/10/2015  . Severe protein-calorie malnutrition ([place]) 12/29/2016  . Situational anxiety 07/29/2016  . Traumatic compression fracture of T11 thoracic vertebra, with routine healing, subsequent encounter 12/29/2016    Past Surgical History:  Procedure Laterality Date  . ANKLE SURGERY    . CATARACT EXTRACTION    . [place]   cancer-radiation   . FEMUR SURGERY  2005   fractured right femur   . HIP SURGERY  2006   fractured right hip   . ORIF FEMUR FRACTURE Right 12/22/2016   Procedure: OPEN REDUCTION INTERNAL FIXATION (ORIF) DISTAL FEMUR FRACTURE;  Surgeon: [name], MD;  Location: [facility];  Service: Orthopedics;  Laterality: Right;  . TONSILLECTOMY      Allergies: Influenza vaccines; Omeprazole; and Coumadin [warfarin sodium]  Medications: Prior to Admission medications   Medication Sig Start Date End Date Taking? Authorizing Provider  atenolol (TENORMIN) 25 MG tablet Take 0.5 tablets (12.5 mg total) by mouth daily. 07/29/16  Yes Gildardo Cranker, DO  Calcium Carbonate-Vitamin D (CALCIUM + D PO) Take 1 tablet by mouth daily.    Yes [provider]  enoxaparin (LOVENOX) 30 MG/0.3ML injection Inject 0.3 mLs (30 mg total) into the skin daily. 12/25/16  Yes [name], MD  feeding supplement, ENSURE ENLIVE, (ENSURE ENLIVE) LIQD Take 237 mLs by mouth 2 (two) times daily between meals. 12/25/16  Yes Waldron Session, MD  menthol-cetylpyridinium (CEPACOL) 3 MG lozenge Take 1 lozenge by mouth. Take 1 lozenge every 2 hours as needed for sore throat   Yes [provider]  Multiple Vitamin (MULTIVITAMIN) tablet Take 1 tablet  by mouth daily.   Yes [provider]  acetaminophen-codeine (TYLENOL #3) 300-30 MG tablet Take 1 tablet by mouth every 6 (six) hours as needed for moderate pain. Patient not taking: Reported on  01/14/2017 12/25/16   [name], MD  PARoxetine (PAXIL) 40 MG tablet TAKE 1 TABLET(40 MG) BY MOUTH DAILY Patient not taking: Reported on 01/14/2017 10/29/16   Gildardo Cranker, DO  QUEtiapine (SEROQUEL) 25 MG tablet Take 25 mg by mouth. Take one tablet twice daily    [provider]     Family History  Problem Relation Age of Onset  . Heart disease Father     Social History   Social History  . Marital status: Single    Spouse name: N/A  . Number of children: N/A  . Years of education: N/A   Occupational History  . 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 Smoking status: Former Smoker    Packs/day: 2.00    Types: Cigarettes    Quit date: 06/08/2013  . Smokeless tobacco: Never Used  . Alcohol use Yes     Comment: 3-4 glasses wine/day  . Drug use: No  . Sexual activity: Not on file   Other Topics Concern  . Not on file   Social History Narrative   Diet:   Do you drink/eat things with caffeine? yes   Marital status:  Married                            What year were you married? 2015   Do you live in a house, apartment, assisted living, condo, trailer, etc)? house   Is it one or more stories? yes   How many persons live in your home? 2                                                                                               Do you have any pets in your home?  2 cats   Current or past profession: Hospital doctor   Do you exercise?    no                                                 Type & how often:   Do you have a living will?  no   Do you have a DNR Form?  no   Do you have a POA/HPOA forms?  No   Admitted to [facility] 12/25/16   Former smoker-stopped 2015   Alcohol 3-4 glasses of wine daily    DNR       ECOG Status: 2 - Symptomatic, <50% confined to bed  Review of Systems: A 12 point ROS discussed and pertinent positives are indicated in the HPI above.  All other systems are negative.  Review of Systems    Constitutional: Positive for activity change. Negative for fever.  Respiratory:  Negative.   Cardiovascular: Negative.   Musculoskeletal: Positive for back pain.    Vital Signs: BP 104/66   Pulse 73   Temp 98.2 F (36.8 C) (Oral)   Resp 14   Ht 5\' 2"  (1.575 m)   Wt 110 lb (49.9 kg)   SpO2 97%   BMI 20.12 kg/m   Physical Exam  Constitutional: She appears well-developed.  HENT:  Head: Normocephalic and atraumatic.  Musculoskeletal:       Arms: Location of patient's midline lower thoracic/upper lumbar spine pain.  Skin: Skin is warm and dry.    Imaging:  Thoracic and lumbar spine MRI performed 12/21/2016 was personally reviewed.   Dg Chest 2 View  Result Date: 12/21/2016 CLINICAL DATA:  [place] 3 days ago.  No reported chest symptoms. EXAM: CHEST  2 VIEW COMPARISON:  08/13/2005. FINDINGS: Normal sized heart. Mild linear density at both lung bases. Approximately 50% lower thoracic vertebral compression deformity. No visible acute fracture lines. No previous lateral view for comparison. IMPRESSION: 1. Approximately 50% lower thoracic vertebral compression deformity, age indeterminate. 2. Mild bibasilar linear atelectasis or scarring. Electronically Signed   By: Claudie Revering M.D.   On: 12/21/2016 13:26   Dg Knee 1-2 Views Right  Result Date: 12/24/2016 CLINICAL DATA:  Distal femur fracture.  Postoperative redness. EXAM: RIGHT KNEE - 1-2 VIEW COMPARISON:  Radiography from 3 days prior. Fluoroscopy from 2 days ago. FINDINGS: Interval lateral plate and screw fixation of a distal femur fracture involving metaphysis and intercondylar region. Bone cement was placed at the metaphysis. No new/operative related fracture is noted. Hardware appears well seated. Normal alignment. Nonspecific soft tissue swelling and joint gas after recent surgery. Osteopenia. IMPRESSION: Stable postoperative changes.  No acute superimposed finding. Electronically Signed   By: Monte Fantasia M.D.   On: 12/24/2016  09:12   Dg Ankle 2 Views Right  Result Date: 12/21/2016 CLINICAL DATA:  Remote right ankle fracture.  Ankle now on stable. EXAM: RIGHT ANKLE - 2 VIEW COMPARISON:  None. FINDINGS: Two views study shows plate and screw fixation of the distal tibia with persistent apex posterior angulation. Degenerative changes are noted at the tibiotalar joint. Relationship of the distal fibula to the ankle mortise not well demonstrated on this study. Bones are diffusely demineralized. IMPRESSION: 1. Posttraumatic deformity of the distal tibia with evidence of ORIF. 2. Osteoarthritis of the tibiotalar joint. Electronically Signed   By: Misty Stanley M.D.   On: 12/21/2016 18:54   Ct Head Wo Contrast  Result Date: 12/21/2016 CLINICAL DATA:  Fall 3 days ago EXAM: CT HEAD WITHOUT CONTRAST CT CERVICAL SPINE WITHOUT CONTRAST TECHNIQUE: Multidetector CT imaging of the head and cervical spine was performed following the standard protocol without intravenous contrast. Multiplanar CT image reconstructions of the cervical spine were also generated. COMPARISON:  None. FINDINGS: CT HEAD FINDINGS Brain: There are mild chronic ischemic changes in the periventricular white matter. No mass effect, midline shift, or acute hemorrhage. Mild global atrophy appropriate to age. Vascular: No hyperdense vessel or unexpected calcification. Skull: Cranium is intact. Sinuses/Orbits: Visualized paranasal sinuses and mastoid air cells are clear. Other: Noncontributory CT CERVICAL SPINE FINDINGS Alignment: There is anterolisthesis C4 upon C5 which is related to facet arthropathy. There is reversed cervical lordosis. Skull base and vertebrae: No acute fracture.  No dislocation. Soft tissues and spinal canal: No evidence of spinal hematoma or soft tissue injury. Disc levels: There is severe narrowing of the C5-6 and C6-7 discs with posterior osteophytic ridging. Spinal canal  is grossly patent. Upper chest: Emphysema.  No lung mass. Other: Thyroid is  unremarkable. IMPRESSION: No acute intracranial pathology. No evidence of cervical spine injury. Chronic changes. Electronically Signed   By: Marybelle Killings M.D.   On: 12/21/2016 13:39   Ct Cervical Spine Wo Contrast  Result Date: 12/21/2016 CLINICAL DATA:  Fall 3 days ago EXAM: CT HEAD WITHOUT CONTRAST CT CERVICAL SPINE WITHOUT CONTRAST TECHNIQUE: Multidetector CT imaging of the head and cervical spine was performed following the standard protocol without intravenous contrast. Multiplanar CT image reconstructions of the cervical spine were also generated. COMPARISON:  None. FINDINGS: CT HEAD FINDINGS Brain: There are mild chronic ischemic changes in the periventricular white matter. No mass effect, midline shift, or acute hemorrhage. Mild global atrophy appropriate to age. Vascular: No hyperdense vessel or unexpected calcification. Skull: Cranium is intact. Sinuses/Orbits: Visualized paranasal sinuses and mastoid air cells are clear. Other: Noncontributory CT CERVICAL SPINE FINDINGS Alignment: There is anterolisthesis C4 upon C5 which is related to facet arthropathy. There is reversed cervical lordosis. Skull base and vertebrae: No acute fracture.  No dislocation. Soft tissues and spinal canal: No evidence of spinal hematoma or soft tissue injury. Disc levels: There is severe narrowing of the C5-6 and C6-7 discs with posterior osteophytic ridging. Spinal canal is grossly patent. Upper chest: Emphysema.  No lung mass. Other: Thyroid is unremarkable. IMPRESSION: No acute intracranial pathology. No evidence of cervical spine injury. Chronic changes. Electronically Signed   By: Marybelle Killings M.D.   On: 12/21/2016 13:39   Ct Knee Right Wo Contrast  Result Date: 12/21/2016 CLINICAL DATA:  Acute distal femur fracture. EXAM: CT OF THE RIGHT KNEE WITHOUT CONTRAST TECHNIQUE: Multidetector CT imaging of the right knee was performed according to the standard protocol. Multiplanar CT image reconstructions were also  generated. COMPARISON:  Radiographs dated 12/21/2016 FINDINGS: Bones/Joint/Cartilage There is an old healed fracture of the distal right femoral shaft with an intramedullary nail in place. There is an old fracture of the lateral femoral condyle with 2 screws in place in the lateral femoral condyle. There is an acute impacted transverse fracture of the metaphysis of the distal right femur. There are no acute vertical fractures through the femoral condyles or intertrochanteric notch. IMPRESSION: Impacted transverse fracture of the metaphysis of the distal right femur. Electronically Signed   By: Lorriane Shire M.D.   On: 12/21/2016 16:26   Mr Thoracic Spine Wo Contrast  Result Date: 12/21/2016 CLINICAL DATA:  Initial evaluation for acute low back pain, history of osteoporosis, suspected compression fracture. EXAM: MRI THORACIC AND LUMBAR SPINE WITHOUT CONTRAST TECHNIQUE: Multiplanar and multiecho pulse sequences of the thoracic and lumbar spine were obtained without intravenous contrast. COMPARISON:  None. FINDINGS: MRI THORACIC SPINE FINDINGS Alignment: Mild exaggeration of the normal thoracic kyphosis. No listhesis or subluxation. Vertebrae: There is abnormal linear T1 hypointense signal intensity with associated T2/STIR hyperintensity involving the T11 vertebral body, consistent with acute compression fracture. Associated height loss of up to 50% the with trace 2 mm bony retropulsion. This fracture is benign/ osteoporotic in appearance. Vertebral body heights are otherwise maintained. No other evidence for acute or chronic fracture. Signal intensity within the vertebral body bone marrow within normal limits. No worrisome osseous lesions. Few small benign hemangiomas noted. Cord:  Signal intensity within the thoracic spinal cord is normal. Paraspinal and other soft tissues: Paraspinous soft tissues within normal limits. Visualized visceral structures grossly unremarkable. Atelectatic changes present within the  posterior right lung base. Disc levels: No significant  degenerative changes seen within the thoracic spine. No significant disc bulge or focal disc protrusion. No canal or foraminal stenosis. MRI LUMBAR SPINE FINDINGS Segmentation: Normal segmentation. Lowest well-formed disc is labeled the L5-S1 level. Alignment: Mild exaggeration of the normal thoracic kyphosis. Trace retrolisthesis of L1 on L2 and L2 on L3. Vertebral bodies otherwise normally aligned. Vertebrae: Acute compression fracture involving the T11 vertebral body, described on thoracic spine portion of this report. Vertebral body heights are otherwise maintained. No other evidence for acute or chronic fracture within the lumbar spine. Signal intensity within the vertebral body bone marrow within normal limits. No discrete or worrisome osseous lesions. Conus medullaris: Extends to the L1 level and appears normal. Paraspinal and other soft tissues: Paraspinous soft tissues within normal limits. Left kidney atrophic with scattered cysts. Infrarenal aorta ectatic measuring up to 2.6 cm. Disc levels: L1-2: Trace retrolisthesis of L1 on L2. Mild diffuse disc bulge with bilateral facet hypertrophy. No significant canal or foraminal stenosis. L2-3: Trace retrolisthesis of L2 on L3. Diffuse circumferential disc bulge. Moderate facet hypertrophy. Resultant mild to moderate canal with bilateral lateral recess stenosis, slightly greater on the left. No significant foraminal encroachment. L3-4: Minimal disc bulge. Moderate bilateral facet hypertrophy. Resultant mild canal and bilateral lateral recess stenosis. No significant foraminal encroachment. L4-5: Minimal annular disc bulge. Moderate bilateral facet arthrosis. Resultant mild to moderate left lateral recess stenosis. No significant canal narrowing. Foramina are widely patent. L5-S1: Shallow posterior disc bulge. Moderate bilateral facet arthropathy. No significant stenosis. IMPRESSION: MR THORACIC SPINE  IMPRESSION 1. Acute compression fracture involving the superior endplate of Z16 with associated 50% height loss with trace 2 mm bony retropulsion. No significant stenosis. 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 2. No other acute traumatic injury within the thoracic spine. MR LUMBAR SPINE IMPRESSION 1. No acute compression fracture or other abnormality within the lumbar spine. 2. 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 3. Moderate facet arthropathy at L4-5 and L5-S1, which could serve as a source for back pain. 4. **An incidental finding of potential clinical significance has been found. 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 Recommend followup by ultrasound in 5 years. This recommendation follows ACR consensus guidelines: White Paper of the ACR Incidental Findings Committee II on Vascular Findings. J Am Coll Radiol 2013; 10:789-794.** Electronically Signed   By: Jeannine Boga M.D.   On: 12/21/2016 21:28   Mr Lumbar Spine Wo Contrast  Result Date: 12/21/2016 CLINICAL DATA:  Initial evaluation for acute low back pain, history of osteoporosis, suspected compression fracture. EXAM: MRI THORACIC AND LUMBAR SPINE WITHOUT CONTRAST TECHNIQUE: Multiplanar and multiecho pulse sequences of the thoracic and lumbar spine were obtained without intravenous contrast. COMPARISON:  None. FINDINGS: MRI THORACIC SPINE FINDINGS Alignment: Mild exaggeration of the normal thoracic kyphosis. No listhesis or subluxation. Vertebrae: There is abnormal linear T1 hypointense signal intensity with associated T2/STIR hyperintensity involving the T11 vertebral body, consistent with acute compression fracture. Associated height loss of up to 50% the with trace 2 mm bony retropulsion. This fracture is benign/ osteoporotic in appearance. Vertebral body heights are otherwise maintained.  No other evidence for acute or chronic fracture. Signal intensity within the vertebral body bone marrow within normal limits. No worrisome osseous lesions. Few small benign hemangiomas noted. Cord:  Signal intensity within the thoracic spinal cord is normal. Paraspinal and other soft tissues: Paraspinous soft tissues within normal limits. Visualized  visceral structures grossly unremarkable. Atelectatic changes present within the posterior right lung base. Disc levels: No significant degenerative changes seen within the thoracic spine. No significant disc bulge or focal disc protrusion. No canal or foraminal stenosis. MRI LUMBAR SPINE FINDINGS Segmentation: Normal segmentation. Lowest well-formed disc is labeled the L5-S1 level. Alignment: Mild exaggeration of the normal thoracic kyphosis. Trace retrolisthesis of L1 on L2 and L2 on L3. Vertebral bodies otherwise normally aligned. Vertebrae: Acute compression fracture involving the T11 vertebral body, described on thoracic spine portion of this report. Vertebral body heights are otherwise maintained. No other evidence for acute or chronic fracture within the lumbar spine. Signal intensity within the vertebral body bone marrow within normal limits. No discrete or worrisome osseous lesions. Conus medullaris: Extends to the L1 level and appears normal. Paraspinal and other soft tissues: Paraspinous soft tissues within normal limits. Left kidney atrophic with scattered cysts. Infrarenal aorta ectatic measuring up to 2.6 cm. Disc levels: L1-2: Trace retrolisthesis of L1 on L2. Mild diffuse disc bulge with bilateral facet hypertrophy. No significant canal or foraminal stenosis. L2-3: Trace retrolisthesis of L2 on L3. Diffuse circumferential disc bulge. Moderate facet hypertrophy. Resultant mild to moderate canal with bilateral lateral recess stenosis, slightly greater on the left. No significant foraminal encroachment. L3-4: Minimal disc bulge. Moderate bilateral facet  hypertrophy. Resultant mild canal and bilateral lateral recess stenosis. No significant foraminal encroachment. L4-5: Minimal annular disc bulge. Moderate bilateral facet arthrosis. Resultant mild to moderate left lateral recess stenosis. No significant canal narrowing. Foramina are widely patent. L5-S1: Shallow posterior disc bulge. Moderate bilateral facet arthropathy. No significant stenosis. IMPRESSION: MR THORACIC SPINE IMPRESSION 1. Acute compression fracture involving the superior endplate of W46 with associated 50% height loss with trace 2 mm bony retropulsion. No significant stenosis. 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 2. No other acute traumatic injury within the thoracic spine. MR LUMBAR SPINE IMPRESSION 1. No acute compression fracture or other abnormality within the lumbar spine. 2. 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 3. Moderate facet arthropathy at L4-5 and L5-S1, which could serve as a source for back pain. 4. **An incidental finding of potential clinical significance has been found. 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 Recommend followup by ultrasound in 5 years. This recommendation follows ACR consensus guidelines: White Paper of the ACR Incidental Findings Committee II on Vascular Findings. J Am Coll Radiol 2013; 10:789-794.** Electronically Signed   By: Jeannine Boga M.D.   On: 12/21/2016 21:28   Pelvis Portable  Result Date: 12/22/2016 CLINICAL DATA:  Recent ORIF of right femoral fracture EXAM: PORTABLE PELVIS 1-2 VIEWS COMPARISON:  02/21/2017 FINDINGS: Postsurgical changes are again noted in the proximal right femur stable from the prior exam. Pelvic ring is intact. Degenerative changes of the lumbar spine are noted. No soft tissue changes are seen. IMPRESSION: Chronic changes in the pelvis.  No acute abnormality noted.  Electronically Signed   By: Inez Catalina M.D.   On: 12/22/2016 21:34   Dg C-arm Gt 120 Min  Result Date: 12/22/2016 CLINICAL DATA:  ORIF of right femoral fracture EXAM: RIGHT FEMUR 2 VIEWS; DG C-ARM GT 120 MIN COMPARISON:  12/21/2016 FLUOROSCOPY TIME:  Fluoroscopy Time:  1 minutes 31 seconds Radiation Exposure Index (if provided by the fluoroscopic device): Not available Number of Acquired Spot Images: Sex FINDINGS: Fixation sideplate is noted along the distal femur with multiple fixation screws.  Additionally the previously seen medullary rod remains in place. Multiple cerclage wires are noted as well. Fracture fragments are in near anatomic alignment. Removal of the previously placed screws in the lateral femoral condyles is noted. Bone cement is noted in the distal fracture site. IMPRESSION: ORIF of distal right femoral fracture Electronically Signed   By: Inez Catalina M.D.   On: 12/22/2016 21:21   Dg Hip Unilat With Pelvis 2-3 Views Right  Result Date: 12/21/2016 CLINICAL DATA:  Right leg pain following a fall 3 days ago. EXAM: DG HIP (WITH OR WITHOUT PELVIS) 2-3V RIGHT COMPARISON:  Right femur radiographs obtained at the same obtained. FINDINGS: Intramedullary rod and screw fixation of the proximal femur. Old, healed proximal femur fracture, laterally. No acute fracture or dislocation. Diffuse osteopenia. IMPRESSION: No right hip fracture or dislocation. Electronically Signed   By: Claudie Revering M.D.   On: 12/21/2016 13:30   Dg Femur, Min 2 Views Right  Result Date: 12/22/2016 CLINICAL DATA:  ORIF of right femoral fracture EXAM: RIGHT FEMUR 2 VIEWS; DG C-ARM GT 120 MIN COMPARISON:  12/21/2016 FLUOROSCOPY TIME:  Fluoroscopy Time:  1 minutes 31 seconds Radiation Exposure Index (if provided by the fluoroscopic device): Not available Number of Acquired Spot Images: Sex FINDINGS: Fixation sideplate is noted along the distal femur with multiple fixation screws. Additionally the previously seen medullary rod  remains in place. Multiple cerclage wires are noted as well. Fracture fragments are in near anatomic alignment. Removal of the previously placed screws in the lateral femoral condyles is noted. Bone cement is noted in the distal fracture site. IMPRESSION: ORIF of distal right femoral fracture Electronically Signed   By: Inez Catalina M.D.   On: 12/22/2016 21:21   Dg Femur Min 2 Views Right  Result Date: 12/21/2016 CLINICAL DATA:  Right leg pain following a fall 3 days ago. EXAM: RIGHT FEMUR 2 VIEWS COMPARISON:  None. FINDINGS: Intramedullary variety extending the length of the femur with proximal and distal fixation screws. Mildly comminuted fracture of the distal femoral metaphysis with possible extension into the joint space. Posterior displacement and angulation of the distal fragments. Old, healed proximal femur fracture. Diffuse osteopenia. Moderate-sized effusion. Atheromatous arterial calcifications. IMPRESSION: Mildly comminuted fracture of the distal femoral metaphysis with possible extension into the joint space with an associated moderate-sized effusion. Electronically Signed   By: Claudie Revering M.D.   On: 12/21/2016 13:29   Xr Femur, Min 2 Views Right  Result Date: 01/06/2017 AP lateral right femur reviewed.  Plate fixation distal femur fracture in good position and alignment.  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  An graft early consolidation appears to be occurring.  No hardware complications.   Labs:  CBC:  Recent Labs  12/23/16 0253 12/24/16 0414 12/25/16 0309 01/04/17  WBC 9.7 7.6 8.1 11.7  HGB 9.6* 8.6* 8.8* 10.0*  HCT 29.6* 26.3* 26.8* 30*  PLT 297 321 398 547*    COAGS:  Recent Labs  12/22/16 0546 12/23/16 0253 12/24/16 0414 12/25/16 0309  INR 1.20 1.24 1.41 1.21    BMP:  Recent Labs  12/21/16 1232 12/21/16 1854 12/22/16 0546 12/23/16 0253 12/24/16 0414  NA 138  --  139 137 137  K 3.4*  --  3.3* 5.5* 4.3  CL 105  --  107 107 108  CO2 22  --  23 18*  21*  GLUCOSE 109*  --  101* 135* 117*  BUN 20  --  16 11 9   CALCIUM 8.6*  --  8.5* 8.2* 8.4*  CREATININE 0.71 0.69 0.59 0.70 0.49  GFRNONAA >60 >60 >60 >60 >60  GFRAA >60 >60 >60 >60 >60    LIVER FUNCTION TESTS:  Recent Labs  07/29/16 1144 12/21/16 1232 12/22/16 0546 12/24/16 0414  BILITOT 1.0 0.8 1.0 0.8  AST 22 71* 55* 45*  ALT 14 30 28  11*  ALKPHOS 64 73 60 53  PROT 6.6 6.6 5.6* 4.8*  ALBUMIN 3.9 3.2* 2.6* 2.3*    TUMOR MARKERS: No results for input(s): AFPTM, CEA, CA199, CHROMGRNA in the last 8760 hours.  Assessment and Plan:  BRENLEY PRIORE is a 76 y.o. female with past medical history significant for cervical cancer, macular degeneration of both eyes, severe protein calorie malnutrition and osteoporosis who fell at home on July 16 suffering a comminuted right femur fracture for which she ultimately underwent ORIF fixation. Workup in the hospital also entailed a thoracic and lumbar spine MRI which demonstrated a moderate (approximately 40%) compression deformity involving the superior endplate of the E16 vertebral body.  The patient reports her back pain as mild and does not in her interfere with her ability to participate with rehabilitation.   Potential treatment options including continued conservative management versus proceeding with definitive fluoroscopic guided kyphoplasty were discussed in detail with the patient.  Risks and benefits of kyphoplasty were discussed with the patient including, but not limited to education regarding the natural healing process of compression fractures without intervention, bleeding, infection, cement migration which may cause spinal cord damage, paralysis, pulmonary embolism or even death.  All of the patient's questions were answered.  Following this prolonged and detailed conversation, the patient does NOT wish to undergo kyphoplasty at this time. She states she will see how her pain improves over the next 1-2 weeks.    I feel  this is very reasonable as her pain is mild and currently does not interfere within her ability to participate with rehabilitation.  Patient will call the interventional radiology clinic in the next couple of weeks if she were to experience persistent and/or worsening back pain.  Thank you for this interesting consult.  I greatly enjoyed meeting TEMPERENCE ZENOR and look forward to participating in their care.  A copy of this report was sent to the requesting provider on this date.  Electronically Signed: Sandi Mariscal 01/14/2017, 4:16 PM   I spent a total of 30 Minutes in face to face in clinical consultation, greater than 50% of which was counseling/coordinating care for symptomatic compression fracture

## 2017-01-14 NOTE — Patient Outreach (Signed)
[facility]) Care Management  01/14/2017  Jody Moore [birth year] [id number]   Attempted to meet with patient at facility, she was not in her room or therapy.   Met with Selena, covering SW, she states that She is unsure of patient progress or discharge plans, requests that RNCM follow up with Seeley, SW next week.  Plan to follow up later. Royetta Crochet. Laymond Purser, RN, BSN, [place] [phone]) Business Cell  [phone]) Toll Free Office

## 2017-01-17 ENCOUNTER — Encounter: Payer: Self-pay | Admitting: Internal Medicine

## 2017-01-20 ENCOUNTER — Ambulatory Visit (INDEPENDENT_AMBULATORY_CARE_PROVIDER_SITE_OTHER): Payer: Medicare Other | Admitting: Orthopaedic Surgery

## 2017-01-20 DIAGNOSIS — S72401D Unspecified fracture of lower end of right femur, subsequent encounter for closed fracture with routine healing: Secondary | ICD-10-CM | POA: Insufficient documentation

## 2017-01-20 NOTE — Progress Notes (Signed)
Office Visit Note   Patient: Jody Moore           Date of Birth: March 24, 1941           MRN: [id number] Visit Date: 01/20/2017              Requested by: Jody Moore, Jody Moore, [place] [id number] PCP: Jody Cranker, DO   Assessment & Plan: Visit Diagnoses: No diagnosis found.  Plan: She can discontinue the knee immobilizer at this time. Physical therapy to work on quad strength and range of motion knee. She'll remain nonweightbearing at least until August 29. However I will discuss with Dr. Marlou Sa weightbearing status on his return. She'll follow up with Dr. Marlou Sa in 2 weeks obtain AP and lateral views of the right femur at that time.  Follow-Up Instructions: Return in about 2 weeks (around 02/03/2017) for Radiographs.   Orders:  No orders of the defined types were placed in this encounter.  No orders of the defined types were placed in this encounter.     Procedures: No procedures performed   Clinical Data: No additional findings.   Subjective: Right femur fracture  HPI Jody Moore returns today follow-up of her right distal femur fracture which she underwent open reduction internal fixation of onset and 1718. She is overall doing well. She is nonweightbearing right leg. Senna fevers chills shortness breath chest pain or calf Pain. Review of Systems See history of present illness  Objective: Vital Signs: There were no vitals taken for this visit.  Physical Exam  Constitutional: She is oriented to person, place, and time. She appears well-developed and well-nourished. No distress.  Neurological: She is alert and oriented to person, place, and time.  Skin: She is not diaphoretic.  Psychiatric: She has a normal mood and affect.    Ortho Exam Right knee she has full extension flexion to approximate 75. Right calf supple nontender. Surgical incisions healing well no signs of infection. No effusion abnormal warmth erythema of the right knee.  Right thigh is soft and nontender Specialty Comments:  No specialty comments available.  Imaging: No results found.   PMFS History: Patient Active Problem List   Diagnosis Date Noted  . Traumatic compression fracture of T11 thoracic vertebra, with routine healing, subsequent encounter 12/29/2016  . Acute blood loss as cause of postoperative anemia 12/29/2016  . Severe protein-calorie malnutrition ([place]) 12/29/2016  . Allergic drug rash due to anti-infective agent 12/29/2016  . Femur fracture ([place]) 12/22/2016  . Femur fracture, right ([place]) 12/21/2016  . Prediabetes 07/29/2016  . Situational anxiety 07/29/2016  . Gastroesophageal reflux disease without esophagitis 07/10/2015  . Severe episode of recurrent major depressive disorder, without psychotic features ([place]) 07/10/2015  . Osteoporosis 07/10/2015  . 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 Essential tremor 10/04/2013  . 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 Cervical cancer ([place])   . Depression   . Essential tremor 10/04/2013  . Femur fracture ([place]) 12/22/2016  . Femur fracture, right ([place]) 12/21/2016  . Gastroesophageal reflux disease without esophagitis 07/10/2015  . 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 Osteoporosis   . Osteoporosis 07/10/2015  . 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 Reflux   . Severe episode of recurrent major depressive disorder, without psychotic features ([place]) 07/10/2015  . Severe protein-calorie malnutrition ([place]) 12/29/2016  . Situational anxiety 07/29/2016  . Traumatic compression fracture of T11 thoracic vertebra,  with routine healing, subsequent encounter 12/29/2016    Family History  Problem Relation Age of Onset  . Heart disease Father     Past Surgical History:  Procedure Laterality Date  . ANKLE SURGERY    . CATARACT EXTRACTION    . [place]   cancer-radiation   . FEMUR SURGERY  2005   fractured  right femur   . HIP SURGERY  2006   fractured right hip   . ORIF FEMUR FRACTURE Right 12/22/2016   Procedure: OPEN REDUCTION INTERNAL FIXATION (ORIF) DISTAL FEMUR FRACTURE;  Surgeon: [name], MD;  Location: [facility];  Service: Orthopedics;  Laterality: Right;  . TONSILLECTOMY     Social History   Occupational History  . 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 Smoking status: Former Smoker    Packs/day: 2.00    Types: Cigarettes    Quit date: 06/08/2013  . Smokeless tobacco: Never Used  . Alcohol use Yes     Comment: 3-4 glasses wine/day  . Drug use: No  . Sexual activity: Not on file

## 2017-01-22 ENCOUNTER — Other Ambulatory Visit: Payer: Self-pay | Admitting: *Deleted

## 2017-01-22 NOTE — Patient Outreach (Signed)
Outreach call to facility to speak with Vikki Ports, SW re: patient progress and discharge planning needs.  RNCM was told that SW no longer working at facility, left VM for Bee who is covering for SW requesting call back regarding update on patient.   Plan to follow up with facility [facility]. Laymond Purser, RN, BSN, [place] [phone]) Business Cell  [phone]) Toll Free Office

## 2017-01-25 ENCOUNTER — Telehealth (INDEPENDENT_AMBULATORY_CARE_PROVIDER_SITE_OTHER): Payer: Self-pay | Admitting: *Deleted

## 2017-01-25 NOTE — Telephone Encounter (Signed)
[facility] called for a weight barring status, Left Vm but didn't leave a return phone number.

## 2017-01-26 NOTE — Telephone Encounter (Signed)
Please advise on WTB status

## 2017-01-26 NOTE — Telephone Encounter (Signed)
Please advise WTB status

## 2017-01-27 ENCOUNTER — Other Ambulatory Visit: Payer: Self-pay | Admitting: *Deleted

## 2017-01-27 ENCOUNTER — Telehealth (INDEPENDENT_AMBULATORY_CARE_PROVIDER_SITE_OTHER): Payer: Self-pay

## 2017-01-27 NOTE — Telephone Encounter (Signed)
nwb

## 2017-01-27 NOTE — Telephone Encounter (Signed)
Patients family would like to know what patients WTB status is at this point. Artis Delay saw patient for you while you were out and stuck strictly by your last note for WTB. However patient family is questioning this. Can you please advise so that I may call patient/family.

## 2017-01-27 NOTE — Telephone Encounter (Signed)
See also other note. This has been addressed.

## 2017-01-27 NOTE — Telephone Encounter (Signed)
Tried calling, no answer. LMVM advising per Dr Dean.  

## 2017-01-27 NOTE — Patient Outreach (Signed)
Jody Moore [facility]) Care Management  01/27/2017  Jody Moore 11/19/1940 [id number]   Met with patient at facility.  Patient reports she is doing better, she states they estimate she has WB status of 75% or better. She hopes to go "home" soon. She reports he spouse is in the hospital and she will be going to stat at her 76 year old mother's home until she and her spouse are recovered and go back home.   RNCM reviewed [facility] care management program. Patient accepted information but declines services at this time.  Plan to sign off, patient has [facility] information for future reference.  Royetta Crochet. Laymond Purser, RN, BSN, [place] [phone]) Business Cell  [phone]) Toll Free Office

## 2017-01-27 NOTE — Telephone Encounter (Signed)
Nonweightbearing until return office visit please call thanks

## 2017-02-03 ENCOUNTER — Ambulatory Visit (INDEPENDENT_AMBULATORY_CARE_PROVIDER_SITE_OTHER): Payer: Medicare Other

## 2017-02-03 ENCOUNTER — Ambulatory Visit (INDEPENDENT_AMBULATORY_CARE_PROVIDER_SITE_OTHER): Payer: Medicare Other | Admitting: Orthopedic Surgery

## 2017-02-03 ENCOUNTER — Encounter (INDEPENDENT_AMBULATORY_CARE_PROVIDER_SITE_OTHER): Payer: Self-pay | Admitting: Orthopedic Surgery

## 2017-02-03 DIAGNOSIS — S72491D Other fracture of lower end of right femur, subsequent encounter for closed fracture with routine healing: Secondary | ICD-10-CM

## 2017-02-04 NOTE — Progress Notes (Signed)
Post-Op Visit Note   Patient: Jody Moore           Date of Birth: 1941-04-15           MRN: [id number] Visit Date: 02/03/2017 PCP: Gildardo Cranker, DO   Assessment & Plan:  Chief Complaint:  Chief Complaint  Patient presents with  . Right Leg - Routine Post Op   Visit Diagnoses:  1. Other closed fracture of distal end of right femur with routine healing, subsequent encounter     Plan: [name] is a 75 year old patient open reduction internal fixation right distal femur in 6 weeks ago.  She is doing better.  Physical therapy is occurring daily at the facility.  On exam she has flexion to 90 and no tenderness at the fracture site.  Radiographs showed good alignment and no evidence of hardware failure.  Plan is to continue with range of motion and straight leg raises.  Touchdown weightbearing not more than 25% is allowable at this time.  She needs repeat radiographs in 2 weeks or evaluation of the fracture.  If she has any clinical symptoms and we'll need to delay weightbearing.  At this time clinically she is doing well and it does appear that some of the bone graft has incorporated.  Plain radiographs needed on return  Follow-Up Instructions: Return in about 2 weeks (around 02/17/2017).   Orders:  Orders Placed This Encounter  Procedures  . XR FEMUR, MIN 2 VIEWS RIGHT   No orders of the defined types were placed in this encounter.   Imaging: No results found.  PMFS History: Patient Active Problem List   Diagnosis Date Noted  . Closed fracture of lower end of right femur with routine healing 01/20/2017  . Traumatic compression fracture of T11 thoracic vertebra, with routine healing, subsequent encounter 12/29/2016  . Acute blood loss as cause of postoperative anemia 12/29/2016  . Severe protein-calorie malnutrition ([place]) 12/29/2016  . Allergic drug rash due to anti-infective agent 12/29/2016  . Femur fracture ([place]) 12/22/2016  . Femur fracture, right ([place]) 12/21/2016    . Prediabetes 07/29/2016  . Situational anxiety 07/29/2016  . Gastroesophageal reflux disease without esophagitis 07/10/2015  . Severe episode of recurrent major depressive disorder, without psychotic features ([place]) 07/10/2015  . Osteoporosis 07/10/2015  . 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 Essential tremor 10/04/2013  . 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 Cervical cancer ([place])   . Depression   . Essential tremor 10/04/2013  . Femur fracture ([place]) 12/22/2016  . Femur fracture, right ([place]) 12/21/2016  . Gastroesophageal reflux disease without esophagitis 07/10/2015  . 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 Osteoporosis   . Osteoporosis 07/10/2015  . 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 Reflux   . Severe episode of recurrent major depressive disorder, without psychotic features ([place]) 07/10/2015  . Severe protein-calorie malnutrition ([place]) 12/29/2016  . Situational anxiety 07/29/2016  . Traumatic compression fracture of T11 thoracic vertebra, with routine healing, subsequent encounter 12/29/2016    Family History  Problem Relation Age of Onset  . Heart disease Father     Past Surgical History:  Procedure Laterality Date  . ANKLE SURGERY    . CATARACT EXTRACTION    . [place]   cancer-radiation   . FEMUR SURGERY  2005   fractured right femur   . HIP SURGERY  2006   fractured right hip   .  ORIF FEMUR FRACTURE Right 12/22/2016   Procedure: OPEN REDUCTION INTERNAL FIXATION (ORIF) DISTAL FEMUR FRACTURE;  Surgeon: [name], MD;  Location: [facility];  Service: Orthopedics;  Laterality: Right;  . TONSILLECTOMY     Social History   Occupational History  . 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 Smoking status: Former Smoker    Packs/day: 2.00    Types: Cigarettes    Quit date: 06/08/2013  . Smokeless tobacco: Never Used  . Alcohol use Yes     Comment: 3-4 glasses  wine/day  . Drug use: No  . Sexual activity: Not on file

## 2017-02-09 ENCOUNTER — Encounter: Payer: Self-pay | Admitting: Internal Medicine

## 2017-02-09 ENCOUNTER — Non-Acute Institutional Stay (SKILLED_NURSING_FACILITY): Payer: Medicare Other | Admitting: Internal Medicine

## 2017-02-09 DIAGNOSIS — S72491D Other fracture of lower end of right femur, subsequent encounter for closed fracture with routine healing: Secondary | ICD-10-CM | POA: Diagnosis not present

## 2017-02-09 DIAGNOSIS — S22080D Wedge compression fracture of T11-T12 vertebra, subsequent encounter for fracture with routine healing: Secondary | ICD-10-CM

## 2017-02-09 DIAGNOSIS — D62 Acute posthemorrhagic anemia: Secondary | ICD-10-CM | POA: Diagnosis not present

## 2017-02-09 DIAGNOSIS — E43 Unspecified severe protein-calorie malnutrition: Secondary | ICD-10-CM

## 2017-02-09 DIAGNOSIS — G250 Essential tremor: Secondary | ICD-10-CM

## 2017-02-09 DIAGNOSIS — F32A Depression, unspecified: Secondary | ICD-10-CM

## 2017-02-09 DIAGNOSIS — F329 Major depressive disorder, single episode, unspecified: Secondary | ICD-10-CM

## 2017-02-09 NOTE — Progress Notes (Signed)
Location:  [place] Room Number: [place] of Service:  SNF [id number])  Provider: Juanetta Snow. Sheppard Coil, MD  PCP: Gildardo Cranker, DO Patient Care Team: Gildardo Cranker, DO as PCP - General (Internal Medicine)  Extended Emergency Contact Information Primary Emergency Contact: [facility] Address: [place],  [address] [address]itter of [place] Phone: [phone] Mobile Phone: [phone] Relation: Spouse Secondary Emergency Contact: Hadley Pen Address: [address] [place] [address] Johnnette Litter of [place] Phone: [phone] Mobile Phone: [phone] Relation: Sister  Allergies  Allergen Reactions  . Influenza Vaccines Hives    Hives/swelling/injection site war to the touch. No relief with benadryl   . Omeprazole Hives  . Coumadin [Warfarin Sodium] Rash    Chief Complaint  Patient presents with  . Discharge Note    discharge from SNF to [facility]    HPI:  76 y.o. female with osteoporosis and depression who was admitted to [facility] from 7/16-20 after she fell and sustained a impacted transverse fracture of the metaphysis of the right distal femur. Patient underwent ORIF with a bone graft. Patient was also found to have an acute fracture of the superior endplate of V25 which will require in the way of treatment pain medications only. Patient was admitted to skilled nursing facility for nonweightbearing for 6-8 weeks supportive care, and OT/PT. Patient is now ready to be discharged to home.      Past Medical History:  Diagnosis Date  . Cervical cancer ([place])   . Depression   . Essential tremor 10/04/2013  . Femur fracture ([place]) 12/22/2016  . Femur fracture, right ([place]) 12/21/2016  . Gastroesophageal reflux disease without esophagitis 07/10/2015  . 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 Osteoporosis   . Osteoporosis 07/10/2015  . 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 Reflux   . Severe episode of recurrent major depressive disorder, without psychotic features ([place]) 07/10/2015  . Severe protein-calorie malnutrition ([place]) 12/29/2016  . Situational anxiety 07/29/2016  . Traumatic compression fracture of T11 thoracic vertebra, with routine healing, subsequent encounter 12/29/2016    Past Surgical History:  Procedure Laterality Date  . ANKLE SURGERY    . CATARACT EXTRACTION    . [place]   cancer-radiation   . FEMUR SURGERY  2005   fractured right femur   . HIP SURGERY  2006   fractured right hip   . ORIF FEMUR FRACTURE Right 12/22/2016   Procedure: OPEN REDUCTION INTERNAL FIXATION (ORIF) DISTAL FEMUR FRACTURE;  Surgeon: [name], MD;  Location: [place];  Service: Orthopedics;  Laterality: Right;  . TONSILLECTOMY       reports that she quit smoking about 3 years ago. Her smoking use included Cigarettes. She smoked 2.00 packs per day. She has never used smokeless tobacco. She reports that she drinks alcohol. She reports that she does not use drugs. Social History   Social History  . Marital status: Single    Spouse name: N/A  . Number of children: N/A  . Years of education: N/A   Occupational History  . 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 Smoking status: Former Smoker    Packs/day: 2.00    Types: Cigarettes    Quit date: 06/08/2013  . Smokeless tobacco: Never Used  . Alcohol use Yes  Comment: 3-4 glasses wine/day  . Drug use: No  . Sexual activity: No   Other Topics Concern  . Not on file   Social History Narrative   Diet:   Do you drink/eat things with caffeine? yes   Marital status:  Married                            What year were you married? 2015   Do you live in a house, apartment, assisted living, condo, trailer, etc)? house   Is it one or more stories? yes   How many persons live in your home? 2                                                                                                Do you have any pets in your home?  2 cats   Current or past profession: Hospital doctor   Do you exercise?    no                                                 Type & how often:   Do you have a living will?  no   Do you have a DNR Form?  no   Do you have a POA/HPOA forms?  No   Admitted to [facility] 12/25/16   Former smoker-stopped 2015   Alcohol 3-4 glasses of wine daily    DNR       Pertinent  Health Maintenance Due  Topic Date Due  . COLONOSCOPY  04/13/2011  . PNA vac Low Risk Adult (2 of 2 - PCV13) 07/09/2016  . INFLUENZA VACCINE  01/06/2017  . DEXA SCAN  Completed    Medications: Allergies as of 02/09/2017      Reactions   Influenza Vaccines Hives   Hives/swelling/injection site war to the touch. No relief with benadryl    Omeprazole Hives   Coumadin [warfarin Sodium] Rash      Medication List       Accurate as of 02/09/17 11:59 PM. Always use your most recent med list.          acetaminophen-codeine 300-30 MG tablet Commonly known as:  TYLENOL #3 Take 1 tablet by mouth every 6 (six) hours as needed for moderate pain.   atenolol 25 MG tablet Commonly known as:  TENORMIN Take 0.5 tablets (12.5 mg total) by mouth daily.   CALCIUM + D PO Take 1 tablet by mouth daily.   enoxaparin 30 MG/0.3ML injection Commonly known as:  LOVENOX Inject 0.3 mLs (30 mg total) into the skin daily.   ENSURE ENLIVE PO Take by mouth. 237 ml three times daily between meals for weight loss   menthol-cetylpyridinium 3 MG lozenge Commonly known as:  CEPACOL Take 1 lozenge by mouth. Take 1 lozenge every 2 hours as needed for sore throat   multivitamin tablet Take 1 tablet by mouth daily.  PARoxetine 40 MG tablet Commonly known as:  PAXIL TAKE 1 TABLET(40 MG) BY MOUTH DAILY   QUEtiapine 25 MG tablet Commonly known as:  SEROQUEL Take 25 mg by mouth at bedtime.        Vitals:   02/09/17 0943  BP: 110/60  Pulse: 76  Resp: 18  Temp: (!) 97.4  F (36.3 C)  SpO2: 97%  Weight: 93 lb 3.2 oz (42.3 kg)  Height: 5\' 3"  (1.6 m)   Body mass index is 16.51 kg/m.  Physical Exam  GENERAL APPEARANCE: Alert, conversant. No acute distress.  HEENT: Unremarkable. RESPIRATORY: Breathing is even, unlabored. Lung sounds are clear   CARDIOVASCULAR: Heart RRR no murmurs, rubs or gallops. No peripheral edema.  GASTROINTESTINAL: Abdomen is soft, non-tender, not distended w/ normal bowel sounds.  NEUROLOGIC: Cranial nerves 2-12 grossly intact. Moves all extremities   Labs reviewed: Basic Metabolic Panel:  Recent Labs  12/22/16 0546 12/23/16 0253 12/24/16 0414  NA 139 137 137  K 3.3* 5.5* 4.3  CL 107 107 108  CO2 23 18* 21*  GLUCOSE 101* 135* 117*  BUN 16 11 9   CREATININE 0.59 0.70 0.49  CALCIUM 8.5* 8.2* 8.4*  MG 1.6*  --   --    No results found for: [facility] Liver Function Tests:  Recent Labs  12/21/16 1232 12/22/16 0546 12/24/16 0414  AST 71* 55* 45*  ALT 30 28 11*  ALKPHOS 73 60 53  BILITOT 0.8 1.0 0.8  PROT 6.6 5.6* 4.8*  ALBUMIN 3.2* 2.6* 2.3*   No results for input(s): LIPASE, AMYLASE in the last 8760 hours. No results for input(s): AMMONIA in the last 8760 hours. CBC:  Recent Labs  12/22/16 0546 12/23/16 0253 12/24/16 0414 12/25/16 0309 01/04/17  WBC 6.5 9.7 7.6 8.1 11.7  NEUTROABS 3.7 9.0*  --   --   --   HGB 9.0* 9.6* 8.6* 8.8* 10.0*  HCT 27.1* 29.6* 26.3* 26.8* 30*  MCV 93.1 95.5 94.3 94.0  --   PLT 291 297 321 398 547*   Lipid No results for input(s): CHOL, HDL, LDLCALC, TRIG in the last 8760 hours. Cardiac Enzymes:  Recent Labs  12/22/16 0546  CKTOTAL 520*   BNP: No results for input(s): BNP in the last 8760 hours. CBG:  Recent Labs  12/23/16 2249  GLUCAP 115*    Procedures and Imaging Studies During Stay: Xr Femur, Min 2 Views Right  Result Date: 02/04/2017 AP lateral right femur reviewed.  Plate fixation of distal femoral fracture in good position and alignment.  Bone graft  noted within the metaphyseal region.  No evidence of loosening around the proximal or distal screws.  Alignment is intact.   Assessment/Plan:   Other closed fracture of distal end of right femur with routine healing, subsequent encounter  Traumatic compression fracture of T11 thoracic vertebra, with routine healing, subsequent encounter  Acute blood loss as cause of postoperative anemia  Severe protein-calorie malnutrition (HCC)  Essential tremor  Depression, unspecified depression type   Patient is being discharged with the following home health services:  OT/PT/nursing  Patient is being discharged with the following durable medical equipment:  Wheelchair  Patient has been advised to f/u with their PCP in 1-2 weeks to bring them up to date on their rehab stay.  Social services at facility was responsible for arranging this appointment.  Pt was provided with a 30 day supply of prescriptions for medications and refills must be obtained from their PCP.  For controlled substances, a  more limited supply may be provided adequate until PCP appointment only.  Medications have been reconciled.   Time spent greater than 30 minutes;> 50% of time with patient was spent reviewing records, labs, tests and studies, counseling and developing plan of care  Noah Delaine. Sheppard Coil, MD

## 2017-02-10 ENCOUNTER — Telehealth (INDEPENDENT_AMBULATORY_CARE_PROVIDER_SITE_OTHER): Payer: Self-pay | Admitting: Orthopedic Surgery

## 2017-02-10 NOTE — Telephone Encounter (Signed)
Pt is in rehab and [facility] called about patient medication. Wants a call back as soon as possible. [facility] [phone]

## 2017-02-11 NOTE — Telephone Encounter (Signed)
Tried calling back. Was transferred to nurses station where the phone continuously rang and no one ever answered.

## 2017-02-16 ENCOUNTER — Encounter: Payer: Self-pay | Admitting: Internal Medicine

## 2017-02-16 DIAGNOSIS — F329 Major depressive disorder, single episode, unspecified: Secondary | ICD-10-CM | POA: Insufficient documentation

## 2017-02-16 DIAGNOSIS — F32A Depression, unspecified: Secondary | ICD-10-CM | POA: Insufficient documentation

## 2017-02-17 ENCOUNTER — Ambulatory Visit (INDEPENDENT_AMBULATORY_CARE_PROVIDER_SITE_OTHER): Payer: Medicare Other

## 2017-02-17 ENCOUNTER — Encounter (INDEPENDENT_AMBULATORY_CARE_PROVIDER_SITE_OTHER): Payer: Self-pay | Admitting: Orthopedic Surgery

## 2017-02-17 ENCOUNTER — Ambulatory Visit (INDEPENDENT_AMBULATORY_CARE_PROVIDER_SITE_OTHER): Payer: Medicare Other | Admitting: Orthopedic Surgery

## 2017-02-17 DIAGNOSIS — I10 Essential (primary) hypertension: Secondary | ICD-10-CM | POA: Diagnosis not present

## 2017-02-17 DIAGNOSIS — S72491D Other fracture of lower end of right femur, subsequent encounter for closed fracture with routine healing: Secondary | ICD-10-CM

## 2017-02-17 DIAGNOSIS — M6281 Muscle weakness (generalized): Secondary | ICD-10-CM | POA: Diagnosis not present

## 2017-02-18 DIAGNOSIS — Z4789 Encounter for other orthopedic aftercare: Secondary | ICD-10-CM | POA: Diagnosis not present

## 2017-02-18 DIAGNOSIS — L97909 Non-pressure chronic ulcer of unspecified part of unspecified lower leg with unspecified severity: Secondary | ICD-10-CM | POA: Diagnosis not present

## 2017-02-18 DIAGNOSIS — Z9181 History of falling: Secondary | ICD-10-CM | POA: Diagnosis not present

## 2017-02-18 DIAGNOSIS — M6281 Muscle weakness (generalized): Secondary | ICD-10-CM | POA: Diagnosis not present

## 2017-02-18 DIAGNOSIS — S32000S Wedge compression fracture of unspecified lumbar vertebra, sequela: Secondary | ICD-10-CM | POA: Diagnosis not present

## 2017-02-19 ENCOUNTER — Telehealth: Payer: Self-pay | Admitting: *Deleted

## 2017-02-19 DIAGNOSIS — E559 Vitamin D deficiency, unspecified: Secondary | ICD-10-CM | POA: Diagnosis not present

## 2017-02-19 NOTE — Telephone Encounter (Signed)
Physical therapist calling from [facility] asking for orders for pt but I advised that pt was last seen by Dr. Eulas Post 07/2016 and would need and appt to be seen. Pt was discharged 02/10/17 by Dr. Sheppard Coil at [facility] and needs follow-up after discharge. Phone just rang no answer and no machine.

## 2017-02-20 NOTE — Progress Notes (Signed)
Post-Op Visit Note   Patient: Jody Moore           Date of Birth: [birth year]           MRN: [id number] Visit Date: 02/17/2017 PCP: Gildardo Cranker, DO   Assessment & Plan:  Chief Complaint:  Chief Complaint  Patient presents with  . Right Leg - Routine Post Op   Visit Diagnoses:  1. Other closed fracture of distal end of right femur with routine healing, subsequent encounter     Plan: Susie is now 8 weeks out right distal femur fracture fixation.  She's been touchdown weightbearing to skilled nursing facility.  She's having no pain.  On exam the flexion is past 90 she has full extension no real warmth or swelling around the distal femoral fracture site.  Radiographs look good.  Plan at this time is to progress her weightbearing from partial weightbearing to full weightbearing over the next 3 weeks.  Repeat radiographs at that time with likely release to full weightbearing if they look good.  Something she needs to be careful but in general her lack of clinical symptoms is very encouraging.  Follow-Up Instructions: Return in about 3 weeks (around 03/10/2017).   Orders:  Orders Placed This Encounter  Procedures  . XR FEMUR, MIN 2 VIEWS RIGHT   No orders of the defined types were placed in this encounter.   Imaging: No results found.  PMFS History: Patient Active Problem List   Diagnosis Date Noted  . Depression 02/16/2017  . Closed fracture of lower end of right femur with routine healing 01/20/2017  . Traumatic compression fracture of T11 thoracic vertebra, with routine healing, subsequent encounter 12/29/2016  . Acute blood loss as cause of postoperative anemia 12/29/2016  . Severe protein-calorie malnutrition ([place]) 12/29/2016  . Allergic drug rash due to anti-infective agent 12/29/2016  . Femur fracture ([place]) 12/22/2016  . Femur fracture, right ([place]) 12/21/2016  . Prediabetes 07/29/2016  . Situational anxiety 07/29/2016  . Gastroesophageal reflux disease  without esophagitis 07/10/2015  . Severe episode of recurrent major depressive disorder, without psychotic features ([place]) 07/10/2015  . Osteoporosis 07/10/2015  . 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 Essential tremor 10/04/2013  . 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 Cervical cancer ([place])   . Depression   . Essential tremor 10/04/2013  . Femur fracture ([place]) 12/22/2016  . Femur fracture, right ([place]) 12/21/2016  . Gastroesophageal reflux disease without esophagitis 07/10/2015  . 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 Osteoporosis   . Osteoporosis 07/10/2015  . 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 Reflux   . Severe episode of recurrent major depressive disorder, without psychotic features ([place]) 07/10/2015  . Severe protein-calorie malnutrition ([place]) 12/29/2016  . Situational anxiety 07/29/2016  . Traumatic compression fracture of T11 thoracic vertebra, with routine healing, subsequent encounter 12/29/2016    Family History  Problem Relation Age of Onset  . Heart disease Father     Past Surgical History:  Procedure Laterality Date  . ANKLE SURGERY    . CATARACT EXTRACTION    . [place]   cancer-radiation   . FEMUR SURGERY  2005   fractured right femur   . HIP SURGERY  2006   fractured right hip   . ORIF FEMUR FRACTURE Right 12/22/2016   Procedure: OPEN REDUCTION INTERNAL FIXATION (ORIF) DISTAL FEMUR FRACTURE;  Surgeon: [name], MD;  Location:  [facility];  Service: Orthopedics;  Laterality: Right;  . TONSILLECTOMY     Social History   Occupational History  . 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 Smoking status: Former Smoker    Packs/day: 2.00    Types: Cigarettes    Quit date: 06/08/2013  . Smokeless tobacco: Never Used  . Alcohol use Yes     Comment: 3-4 glasses wine/day  . Drug use: No  . Sexual activity: No

## 2017-02-22 DIAGNOSIS — S32000S Wedge compression fracture of unspecified lumbar vertebra, sequela: Secondary | ICD-10-CM | POA: Diagnosis not present

## 2017-02-22 DIAGNOSIS — Z9181 History of falling: Secondary | ICD-10-CM | POA: Diagnosis not present

## 2017-02-22 DIAGNOSIS — Z4789 Encounter for other orthopedic aftercare: Secondary | ICD-10-CM | POA: Diagnosis not present

## 2017-02-22 DIAGNOSIS — L97909 Non-pressure chronic ulcer of unspecified part of unspecified lower leg with unspecified severity: Secondary | ICD-10-CM | POA: Diagnosis not present

## 2017-02-22 DIAGNOSIS — M6281 Muscle weakness (generalized): Secondary | ICD-10-CM | POA: Diagnosis not present

## 2017-02-23 ENCOUNTER — Telehealth (INDEPENDENT_AMBULATORY_CARE_PROVIDER_SITE_OTHER): Payer: Self-pay | Admitting: Orthopedic Surgery

## 2017-02-23 DIAGNOSIS — L97909 Non-pressure chronic ulcer of unspecified part of unspecified lower leg with unspecified severity: Secondary | ICD-10-CM | POA: Diagnosis not present

## 2017-02-23 DIAGNOSIS — S32000S Wedge compression fracture of unspecified lumbar vertebra, sequela: Secondary | ICD-10-CM | POA: Diagnosis not present

## 2017-02-23 DIAGNOSIS — Z4789 Encounter for other orthopedic aftercare: Secondary | ICD-10-CM | POA: Diagnosis not present

## 2017-02-23 DIAGNOSIS — Z9181 History of falling: Secondary | ICD-10-CM | POA: Diagnosis not present

## 2017-02-23 DIAGNOSIS — M6281 Muscle weakness (generalized): Secondary | ICD-10-CM | POA: Diagnosis not present

## 2017-02-23 DIAGNOSIS — Z7689 Persons encountering health services in other specified circumstances: Secondary | ICD-10-CM

## 2017-02-23 NOTE — Telephone Encounter (Signed)
Sharyn Lull from [facility] called asking for verbal approval on 1 time a week for 1 week, 2 times a week for 3 weeks and 1 time a week for 2 weeks. CB # [phone]

## 2017-02-23 NOTE — Telephone Encounter (Signed)
ok 

## 2017-02-23 NOTE — Telephone Encounter (Signed)
Monique from [facility] called asking for verbal approval of 2 times a week for 5 weeks for strengthening, home safety, gate balance and transfers. CB # [phone]

## 2017-02-23 NOTE — Telephone Encounter (Signed)
Patient's sister called stating that her sister would like to get a referral to Dr. Tomi Likens.  CB#[phone].  Thank you.

## 2017-02-23 NOTE — Telephone Encounter (Signed)
IC advised ok.  

## 2017-02-23 NOTE — Telephone Encounter (Signed)
Referral made. IC advised done.

## 2017-02-23 NOTE — Telephone Encounter (Signed)
IC, no answer LMVM advising had already give verbal order today to [place] from [facility]. See notes in chart.

## 2017-02-23 NOTE — Telephone Encounter (Signed)
Please advise if ok for referral.  Patients sister is calling. Per notes in chart, patient was deemed not able to make decisions on her own and patient has POA and her sister is not listed.

## 2017-02-24 DIAGNOSIS — M6281 Muscle weakness (generalized): Secondary | ICD-10-CM | POA: Diagnosis not present

## 2017-02-24 DIAGNOSIS — S7291XS Unspecified fracture of right femur, sequela: Secondary | ICD-10-CM | POA: Diagnosis not present

## 2017-02-24 DIAGNOSIS — I10 Essential (primary) hypertension: Secondary | ICD-10-CM | POA: Diagnosis not present

## 2017-02-25 DIAGNOSIS — M6281 Muscle weakness (generalized): Secondary | ICD-10-CM | POA: Diagnosis not present

## 2017-02-25 DIAGNOSIS — Z9181 History of falling: Secondary | ICD-10-CM | POA: Diagnosis not present

## 2017-02-25 DIAGNOSIS — L97909 Non-pressure chronic ulcer of unspecified part of unspecified lower leg with unspecified severity: Secondary | ICD-10-CM | POA: Diagnosis not present

## 2017-02-25 DIAGNOSIS — S32000S Wedge compression fracture of unspecified lumbar vertebra, sequela: Secondary | ICD-10-CM | POA: Diagnosis not present

## 2017-02-25 DIAGNOSIS — Z4789 Encounter for other orthopedic aftercare: Secondary | ICD-10-CM | POA: Diagnosis not present

## 2017-02-26 DIAGNOSIS — L97909 Non-pressure chronic ulcer of unspecified part of unspecified lower leg with unspecified severity: Secondary | ICD-10-CM | POA: Diagnosis not present

## 2017-02-26 DIAGNOSIS — Z4789 Encounter for other orthopedic aftercare: Secondary | ICD-10-CM | POA: Diagnosis not present

## 2017-02-26 DIAGNOSIS — M6281 Muscle weakness (generalized): Secondary | ICD-10-CM | POA: Diagnosis not present

## 2017-02-26 DIAGNOSIS — S32000S Wedge compression fracture of unspecified lumbar vertebra, sequela: Secondary | ICD-10-CM | POA: Diagnosis not present

## 2017-02-26 DIAGNOSIS — Z9181 History of falling: Secondary | ICD-10-CM | POA: Diagnosis not present

## 2017-02-28 DIAGNOSIS — L97909 Non-pressure chronic ulcer of unspecified part of unspecified lower leg with unspecified severity: Secondary | ICD-10-CM | POA: Diagnosis not present

## 2017-02-28 DIAGNOSIS — S32000S Wedge compression fracture of unspecified lumbar vertebra, sequela: Secondary | ICD-10-CM | POA: Diagnosis not present

## 2017-02-28 DIAGNOSIS — Z4789 Encounter for other orthopedic aftercare: Secondary | ICD-10-CM | POA: Diagnosis not present

## 2017-02-28 DIAGNOSIS — M6281 Muscle weakness (generalized): Secondary | ICD-10-CM | POA: Diagnosis not present

## 2017-02-28 DIAGNOSIS — Z9181 History of falling: Secondary | ICD-10-CM | POA: Diagnosis not present

## 2017-03-01 DIAGNOSIS — M6281 Muscle weakness (generalized): Secondary | ICD-10-CM | POA: Diagnosis not present

## 2017-03-01 DIAGNOSIS — S32000S Wedge compression fracture of unspecified lumbar vertebra, sequela: Secondary | ICD-10-CM | POA: Diagnosis not present

## 2017-03-01 DIAGNOSIS — Z9181 History of falling: Secondary | ICD-10-CM | POA: Diagnosis not present

## 2017-03-01 DIAGNOSIS — L97909 Non-pressure chronic ulcer of unspecified part of unspecified lower leg with unspecified severity: Secondary | ICD-10-CM | POA: Diagnosis not present

## 2017-03-01 DIAGNOSIS — Z4789 Encounter for other orthopedic aftercare: Secondary | ICD-10-CM | POA: Diagnosis not present

## 2017-03-02 DIAGNOSIS — Z4789 Encounter for other orthopedic aftercare: Secondary | ICD-10-CM | POA: Diagnosis not present

## 2017-03-02 DIAGNOSIS — M6281 Muscle weakness (generalized): Secondary | ICD-10-CM | POA: Diagnosis not present

## 2017-03-02 DIAGNOSIS — S32000S Wedge compression fracture of unspecified lumbar vertebra, sequela: Secondary | ICD-10-CM | POA: Diagnosis not present

## 2017-03-02 DIAGNOSIS — Z9181 History of falling: Secondary | ICD-10-CM | POA: Diagnosis not present

## 2017-03-02 DIAGNOSIS — L97909 Non-pressure chronic ulcer of unspecified part of unspecified lower leg with unspecified severity: Secondary | ICD-10-CM | POA: Diagnosis not present

## 2017-03-04 DIAGNOSIS — M6281 Muscle weakness (generalized): Secondary | ICD-10-CM | POA: Diagnosis not present

## 2017-03-04 DIAGNOSIS — S32000S Wedge compression fracture of unspecified lumbar vertebra, sequela: Secondary | ICD-10-CM | POA: Diagnosis not present

## 2017-03-04 DIAGNOSIS — Z4789 Encounter for other orthopedic aftercare: Secondary | ICD-10-CM | POA: Diagnosis not present

## 2017-03-04 DIAGNOSIS — L97909 Non-pressure chronic ulcer of unspecified part of unspecified lower leg with unspecified severity: Secondary | ICD-10-CM | POA: Diagnosis not present

## 2017-03-04 DIAGNOSIS — Z9181 History of falling: Secondary | ICD-10-CM | POA: Diagnosis not present

## 2017-03-09 DIAGNOSIS — M80051D Age-related osteoporosis with current pathological fracture, right femur, subsequent encounter for fracture with routine healing: Secondary | ICD-10-CM | POA: Diagnosis not present

## 2017-03-09 DIAGNOSIS — R1310 Dysphagia, unspecified: Secondary | ICD-10-CM | POA: Diagnosis not present

## 2017-03-09 DIAGNOSIS — Z87891 Personal history of nicotine dependence: Secondary | ICD-10-CM | POA: Diagnosis not present

## 2017-03-09 DIAGNOSIS — R7303 Prediabetes: Secondary | ICD-10-CM | POA: Diagnosis not present

## 2017-03-09 DIAGNOSIS — M6281 Muscle weakness (generalized): Secondary | ICD-10-CM | POA: Diagnosis not present

## 2017-03-09 DIAGNOSIS — M8008XD Age-related osteoporosis with current pathological fracture, vertebra(e), subsequent encounter for fracture with routine healing: Secondary | ICD-10-CM | POA: Diagnosis not present

## 2017-03-09 DIAGNOSIS — H3530 Unspecified macular degeneration: Secondary | ICD-10-CM | POA: Diagnosis not present

## 2017-03-09 DIAGNOSIS — M4306 Spondylolysis, lumbar region: Secondary | ICD-10-CM | POA: Diagnosis not present

## 2017-03-09 DIAGNOSIS — Z9181 History of falling: Secondary | ICD-10-CM | POA: Diagnosis not present

## 2017-03-09 DIAGNOSIS — G250 Essential tremor: Secondary | ICD-10-CM | POA: Diagnosis not present

## 2017-03-10 ENCOUNTER — Encounter (INDEPENDENT_AMBULATORY_CARE_PROVIDER_SITE_OTHER): Payer: Self-pay | Admitting: Orthopedic Surgery

## 2017-03-10 ENCOUNTER — Ambulatory Visit (INDEPENDENT_AMBULATORY_CARE_PROVIDER_SITE_OTHER): Payer: Medicare Other

## 2017-03-10 ENCOUNTER — Ambulatory Visit (INDEPENDENT_AMBULATORY_CARE_PROVIDER_SITE_OTHER): Payer: Medicare Other | Admitting: Orthopedic Surgery

## 2017-03-10 DIAGNOSIS — R21 Rash and other nonspecific skin eruption: Secondary | ICD-10-CM | POA: Diagnosis not present

## 2017-03-10 DIAGNOSIS — M80051D Age-related osteoporosis with current pathological fracture, right femur, subsequent encounter for fracture with routine healing: Secondary | ICD-10-CM | POA: Diagnosis not present

## 2017-03-10 DIAGNOSIS — R7303 Prediabetes: Secondary | ICD-10-CM | POA: Diagnosis not present

## 2017-03-10 DIAGNOSIS — M6281 Muscle weakness (generalized): Secondary | ICD-10-CM | POA: Diagnosis not present

## 2017-03-10 DIAGNOSIS — S72491D Other fracture of lower end of right femur, subsequent encounter for closed fracture with routine healing: Secondary | ICD-10-CM | POA: Diagnosis not present

## 2017-03-10 DIAGNOSIS — H3530 Unspecified macular degeneration: Secondary | ICD-10-CM | POA: Diagnosis not present

## 2017-03-10 DIAGNOSIS — Z87891 Personal history of nicotine dependence: Secondary | ICD-10-CM | POA: Diagnosis not present

## 2017-03-10 DIAGNOSIS — M4306 Spondylolysis, lumbar region: Secondary | ICD-10-CM | POA: Diagnosis not present

## 2017-03-10 DIAGNOSIS — M8008XD Age-related osteoporosis with current pathological fracture, vertebra(e), subsequent encounter for fracture with routine healing: Secondary | ICD-10-CM | POA: Diagnosis not present

## 2017-03-10 DIAGNOSIS — G250 Essential tremor: Secondary | ICD-10-CM | POA: Diagnosis not present

## 2017-03-10 DIAGNOSIS — R1310 Dysphagia, unspecified: Secondary | ICD-10-CM | POA: Diagnosis not present

## 2017-03-10 DIAGNOSIS — Z9181 History of falling: Secondary | ICD-10-CM | POA: Diagnosis not present

## 2017-03-10 NOTE — Progress Notes (Signed)
Post-Op Visit Note   Patient: Jody Moore           Date of Birth: 1940/11/10           MRN: [id number] Visit Date: 03/10/2017 PCP: Abby Potash, PA-C   Assessment & Plan:  Chief Complaint:  Chief Complaint  Patient presents with  . Right Hip - Follow-up   Visit Diagnoses:  1. Other closed fracture of distal end of right femur with routine healing, subsequent encounter     Plan: [name] is a patient with right distal femur fracture.  She's been doing well with the distal femur but she reports a 1 week history of having pain in the proximal tibial plateau region of the right knee.  On examination there is no pain or tenderness to palpation of the proximal or distal femur.  Trace effusion in the knee is present but range of motion is painless.  She localizes all of her pain to the proximal medial tibial plateau region.  No bruising or ecchymosis is noted on this side.  Collateral ligaments are stable and pedal pulses palpable.  Impression is improvement in radiographic appearance of fracture.  No evidence of hardware loosening or failure.  Knee pain medial tibial plateau with normal x-rays.  Plan is weightbearing as tolerated but less than 50 feet.  I really want pain to be her guide.  If it's too painful she should not walk on it.  Otherwise I think it is okay for her to start fully weightbearing on that right leg in a walker.  If she has difficulty come back for further imaging which would be CT scan.  Otherwise I'll see her back in 8 weeks for a final check.  Follow-Up Instructions: Return in about 8 weeks (around 05/05/2017).   Orders:  Orders Placed This Encounter  Procedures  . XR FEMUR, MIN 2 VIEWS RIGHT  . XR Knee Complete 4 Views Right   No orders of the defined types were placed in this encounter.   Imaging: Xr Femur, Min 2 Views Right  Result Date: 03/10/2017 AP lateral right femur reviewed.  Distal femoral fracture appears to have interval callus formation and  continued healing.  No evidence of hardware failure or loosening around the proximal or distal screws.  The knee joint has arthritis.  No other fractures present.  Xr Knee Complete 4 Views Right  Result Date: 03/10/2017 AP lateral and 2 obliques right knee reviewed.  Medial and lateral compartment arthritis is present.  Hardware for distal femoral fixation of fracture appears to be in good position and alignment with some callus formation seen particularly posteriorly.  No new fracture around the tibial plateau is visualized   PMFS History: Patient Active Problem List   Diagnosis Date Noted  . Depression 02/16/2017  . Closed fracture of lower end of right femur with routine healing 01/20/2017  . Traumatic compression fracture of T11 thoracic vertebra, with routine healing, subsequent encounter 12/29/2016  . Acute blood loss as cause of postoperative anemia 12/29/2016  . Severe protein-calorie malnutrition ([place]) 12/29/2016  . Allergic drug rash due to anti-infective agent 12/29/2016  . Femur fracture ([place]) 12/22/2016  . Femur fracture, right ([place]) 12/21/2016  . Prediabetes 07/29/2016  . Situational anxiety 07/29/2016  . Gastroesophageal reflux disease without esophagitis 07/10/2015  . Severe episode of recurrent major depressive disorder, without psychotic features ([place]) 07/10/2015  . Osteoporosis 07/10/2015  . 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 Essential tremor 10/04/2013  . peripheral  neuropathy due to alcohol 10/04/2013   Past Medical History:  Diagnosis Date  . Cervical cancer ([place])   . Depression   . Essential tremor 10/04/2013  . Femur fracture ([place]) 12/22/2016  . Femur fracture, right ([place]) 12/21/2016  . Gastroesophageal reflux disease without esophagitis 07/10/2015  . 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 Osteoporosis   . Osteoporosis 07/10/2015  . 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 Reflux   . Severe episode of recurrent major depressive  disorder, without psychotic features ([place]) 07/10/2015  . Severe protein-calorie malnutrition ([place]) 12/29/2016  . Situational anxiety 07/29/2016  . Traumatic compression fracture of T11 thoracic vertebra, with routine healing, subsequent encounter 12/29/2016    Family History  Problem Relation Age of Onset  . Heart disease Father     Past Surgical History:  Procedure Laterality Date  . ANKLE SURGERY    . CATARACT EXTRACTION    . [place]   cancer-radiation   . FEMUR SURGERY  2005   fractured right femur   . HIP SURGERY  2006   fractured right hip   . ORIF FEMUR FRACTURE Right 12/22/2016   Procedure: OPEN REDUCTION INTERNAL FIXATION (ORIF) DISTAL FEMUR FRACTURE;  Surgeon: [name], MD;  Location: [facility];  Service: Orthopedics;  Laterality: Right;  . TONSILLECTOMY     Social History   Occupational History  . 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 Smoking status: Former Smoker    Packs/day: 2.00    Types: Cigarettes    Quit date: 06/08/2013  . Smokeless tobacco: Never Used  . Alcohol use Yes     Comment: 3-4 glasses wine/day  . Drug use: No  . Sexual activity: No

## 2017-03-11 DIAGNOSIS — M6281 Muscle weakness (generalized): Secondary | ICD-10-CM | POA: Diagnosis not present

## 2017-03-11 DIAGNOSIS — M80051D Age-related osteoporosis with current pathological fracture, right femur, subsequent encounter for fracture with routine healing: Secondary | ICD-10-CM | POA: Diagnosis not present

## 2017-03-11 DIAGNOSIS — H3530 Unspecified macular degeneration: Secondary | ICD-10-CM | POA: Diagnosis not present

## 2017-03-11 DIAGNOSIS — Z87891 Personal history of nicotine dependence: Secondary | ICD-10-CM | POA: Diagnosis not present

## 2017-03-11 DIAGNOSIS — R1310 Dysphagia, unspecified: Secondary | ICD-10-CM | POA: Diagnosis not present

## 2017-03-11 DIAGNOSIS — G250 Essential tremor: Secondary | ICD-10-CM | POA: Diagnosis not present

## 2017-03-11 DIAGNOSIS — Z9181 History of falling: Secondary | ICD-10-CM | POA: Diagnosis not present

## 2017-03-11 DIAGNOSIS — R7303 Prediabetes: Secondary | ICD-10-CM | POA: Diagnosis not present

## 2017-03-11 DIAGNOSIS — M4306 Spondylolysis, lumbar region: Secondary | ICD-10-CM | POA: Diagnosis not present

## 2017-03-11 DIAGNOSIS — M8008XD Age-related osteoporosis with current pathological fracture, vertebra(e), subsequent encounter for fracture with routine healing: Secondary | ICD-10-CM | POA: Diagnosis not present

## 2017-03-12 DIAGNOSIS — M80051D Age-related osteoporosis with current pathological fracture, right femur, subsequent encounter for fracture with routine healing: Secondary | ICD-10-CM | POA: Diagnosis not present

## 2017-03-12 DIAGNOSIS — Z87891 Personal history of nicotine dependence: Secondary | ICD-10-CM | POA: Diagnosis not present

## 2017-03-12 DIAGNOSIS — M8008XD Age-related osteoporosis with current pathological fracture, vertebra(e), subsequent encounter for fracture with routine healing: Secondary | ICD-10-CM | POA: Diagnosis not present

## 2017-03-12 DIAGNOSIS — Z9181 History of falling: Secondary | ICD-10-CM | POA: Diagnosis not present

## 2017-03-12 DIAGNOSIS — M6281 Muscle weakness (generalized): Secondary | ICD-10-CM | POA: Diagnosis not present

## 2017-03-12 DIAGNOSIS — R1310 Dysphagia, unspecified: Secondary | ICD-10-CM | POA: Diagnosis not present

## 2017-03-12 DIAGNOSIS — R7303 Prediabetes: Secondary | ICD-10-CM | POA: Diagnosis not present

## 2017-03-12 DIAGNOSIS — G250 Essential tremor: Secondary | ICD-10-CM | POA: Diagnosis not present

## 2017-03-12 DIAGNOSIS — H3530 Unspecified macular degeneration: Secondary | ICD-10-CM | POA: Diagnosis not present

## 2017-03-12 DIAGNOSIS — M4306 Spondylolysis, lumbar region: Secondary | ICD-10-CM | POA: Diagnosis not present

## 2017-03-13 DIAGNOSIS — S72401D Unspecified fracture of lower end of right femur, subsequent encounter for closed fracture with routine healing: Secondary | ICD-10-CM | POA: Diagnosis not present

## 2017-03-15 DIAGNOSIS — R1310 Dysphagia, unspecified: Secondary | ICD-10-CM | POA: Diagnosis not present

## 2017-03-15 DIAGNOSIS — R7303 Prediabetes: Secondary | ICD-10-CM | POA: Diagnosis not present

## 2017-03-15 DIAGNOSIS — M8008XD Age-related osteoporosis with current pathological fracture, vertebra(e), subsequent encounter for fracture with routine healing: Secondary | ICD-10-CM | POA: Diagnosis not present

## 2017-03-15 DIAGNOSIS — H3530 Unspecified macular degeneration: Secondary | ICD-10-CM | POA: Diagnosis not present

## 2017-03-15 DIAGNOSIS — M4306 Spondylolysis, lumbar region: Secondary | ICD-10-CM | POA: Diagnosis not present

## 2017-03-15 DIAGNOSIS — M6281 Muscle weakness (generalized): Secondary | ICD-10-CM | POA: Diagnosis not present

## 2017-03-15 DIAGNOSIS — Z9181 History of falling: Secondary | ICD-10-CM | POA: Diagnosis not present

## 2017-03-15 DIAGNOSIS — M80051D Age-related osteoporosis with current pathological fracture, right femur, subsequent encounter for fracture with routine healing: Secondary | ICD-10-CM | POA: Diagnosis not present

## 2017-03-15 DIAGNOSIS — G250 Essential tremor: Secondary | ICD-10-CM | POA: Diagnosis not present

## 2017-03-15 DIAGNOSIS — Z87891 Personal history of nicotine dependence: Secondary | ICD-10-CM | POA: Diagnosis not present

## 2017-03-16 DIAGNOSIS — R1310 Dysphagia, unspecified: Secondary | ICD-10-CM | POA: Diagnosis not present

## 2017-03-16 DIAGNOSIS — R7303 Prediabetes: Secondary | ICD-10-CM | POA: Diagnosis not present

## 2017-03-16 DIAGNOSIS — M80051D Age-related osteoporosis with current pathological fracture, right femur, subsequent encounter for fracture with routine healing: Secondary | ICD-10-CM | POA: Diagnosis not present

## 2017-03-16 DIAGNOSIS — G250 Essential tremor: Secondary | ICD-10-CM | POA: Diagnosis not present

## 2017-03-16 DIAGNOSIS — M8008XD Age-related osteoporosis with current pathological fracture, vertebra(e), subsequent encounter for fracture with routine healing: Secondary | ICD-10-CM | POA: Diagnosis not present

## 2017-03-16 DIAGNOSIS — H3530 Unspecified macular degeneration: Secondary | ICD-10-CM | POA: Diagnosis not present

## 2017-03-16 DIAGNOSIS — M4306 Spondylolysis, lumbar region: Secondary | ICD-10-CM | POA: Diagnosis not present

## 2017-03-16 DIAGNOSIS — Z87891 Personal history of nicotine dependence: Secondary | ICD-10-CM | POA: Diagnosis not present

## 2017-03-16 DIAGNOSIS — M6281 Muscle weakness (generalized): Secondary | ICD-10-CM | POA: Diagnosis not present

## 2017-03-16 DIAGNOSIS — Z9181 History of falling: Secondary | ICD-10-CM | POA: Diagnosis not present

## 2017-03-17 ENCOUNTER — Telehealth (INDEPENDENT_AMBULATORY_CARE_PROVIDER_SITE_OTHER): Payer: Self-pay | Admitting: Orthopedic Surgery

## 2017-03-17 DIAGNOSIS — Z9181 History of falling: Secondary | ICD-10-CM | POA: Diagnosis not present

## 2017-03-17 DIAGNOSIS — M8008XD Age-related osteoporosis with current pathological fracture, vertebra(e), subsequent encounter for fracture with routine healing: Secondary | ICD-10-CM | POA: Diagnosis not present

## 2017-03-17 DIAGNOSIS — M80051D Age-related osteoporosis with current pathological fracture, right femur, subsequent encounter for fracture with routine healing: Secondary | ICD-10-CM | POA: Diagnosis not present

## 2017-03-17 DIAGNOSIS — R1310 Dysphagia, unspecified: Secondary | ICD-10-CM | POA: Diagnosis not present

## 2017-03-17 DIAGNOSIS — M6281 Muscle weakness (generalized): Secondary | ICD-10-CM | POA: Diagnosis not present

## 2017-03-17 DIAGNOSIS — R7303 Prediabetes: Secondary | ICD-10-CM | POA: Diagnosis not present

## 2017-03-17 DIAGNOSIS — Z87891 Personal history of nicotine dependence: Secondary | ICD-10-CM | POA: Diagnosis not present

## 2017-03-17 DIAGNOSIS — G250 Essential tremor: Secondary | ICD-10-CM | POA: Diagnosis not present

## 2017-03-17 DIAGNOSIS — H3530 Unspecified macular degeneration: Secondary | ICD-10-CM | POA: Diagnosis not present

## 2017-03-17 DIAGNOSIS — M4306 Spondylolysis, lumbar region: Secondary | ICD-10-CM | POA: Diagnosis not present

## 2017-03-22 ENCOUNTER — Encounter: Payer: Self-pay | Admitting: Neurology

## 2017-03-22 ENCOUNTER — Encounter: Payer: Self-pay | Admitting: Interventional Radiology

## 2017-03-22 DIAGNOSIS — M4306 Spondylolysis, lumbar region: Secondary | ICD-10-CM | POA: Diagnosis not present

## 2017-03-22 DIAGNOSIS — H3530 Unspecified macular degeneration: Secondary | ICD-10-CM | POA: Diagnosis not present

## 2017-03-22 DIAGNOSIS — M80051D Age-related osteoporosis with current pathological fracture, right femur, subsequent encounter for fracture with routine healing: Secondary | ICD-10-CM | POA: Diagnosis not present

## 2017-03-22 DIAGNOSIS — G250 Essential tremor: Secondary | ICD-10-CM | POA: Diagnosis not present

## 2017-03-22 DIAGNOSIS — R7303 Prediabetes: Secondary | ICD-10-CM | POA: Diagnosis not present

## 2017-03-22 DIAGNOSIS — R1310 Dysphagia, unspecified: Secondary | ICD-10-CM | POA: Diagnosis not present

## 2017-03-22 DIAGNOSIS — M6281 Muscle weakness (generalized): Secondary | ICD-10-CM | POA: Diagnosis not present

## 2017-03-22 DIAGNOSIS — M8008XD Age-related osteoporosis with current pathological fracture, vertebra(e), subsequent encounter for fracture with routine healing: Secondary | ICD-10-CM | POA: Diagnosis not present

## 2017-03-22 DIAGNOSIS — Z87891 Personal history of nicotine dependence: Secondary | ICD-10-CM | POA: Diagnosis not present

## 2017-03-22 DIAGNOSIS — Z9181 History of falling: Secondary | ICD-10-CM | POA: Diagnosis not present

## 2017-03-25 DIAGNOSIS — M6281 Muscle weakness (generalized): Secondary | ICD-10-CM | POA: Diagnosis not present

## 2017-03-25 DIAGNOSIS — H3530 Unspecified macular degeneration: Secondary | ICD-10-CM | POA: Diagnosis not present

## 2017-03-25 DIAGNOSIS — M8008XD Age-related osteoporosis with current pathological fracture, vertebra(e), subsequent encounter for fracture with routine healing: Secondary | ICD-10-CM | POA: Diagnosis not present

## 2017-03-25 DIAGNOSIS — R7303 Prediabetes: Secondary | ICD-10-CM | POA: Diagnosis not present

## 2017-03-25 DIAGNOSIS — M80051D Age-related osteoporosis with current pathological fracture, right femur, subsequent encounter for fracture with routine healing: Secondary | ICD-10-CM | POA: Diagnosis not present

## 2017-03-25 DIAGNOSIS — Z87891 Personal history of nicotine dependence: Secondary | ICD-10-CM | POA: Diagnosis not present

## 2017-03-25 DIAGNOSIS — G250 Essential tremor: Secondary | ICD-10-CM | POA: Diagnosis not present

## 2017-03-25 DIAGNOSIS — M4306 Spondylolysis, lumbar region: Secondary | ICD-10-CM | POA: Diagnosis not present

## 2017-03-25 DIAGNOSIS — R1310 Dysphagia, unspecified: Secondary | ICD-10-CM | POA: Diagnosis not present

## 2017-03-25 DIAGNOSIS — Z9181 History of falling: Secondary | ICD-10-CM | POA: Diagnosis not present

## 2017-03-31 DIAGNOSIS — D519 Vitamin B12 deficiency anemia, unspecified: Secondary | ICD-10-CM | POA: Diagnosis not present

## 2017-03-31 DIAGNOSIS — M6281 Muscle weakness (generalized): Secondary | ICD-10-CM | POA: Diagnosis not present

## 2017-03-31 DIAGNOSIS — I10 Essential (primary) hypertension: Secondary | ICD-10-CM | POA: Diagnosis not present

## 2017-04-07 DIAGNOSIS — I10 Essential (primary) hypertension: Secondary | ICD-10-CM | POA: Diagnosis not present

## 2017-04-07 DIAGNOSIS — M6281 Muscle weakness (generalized): Secondary | ICD-10-CM | POA: Diagnosis not present

## 2017-04-13 DIAGNOSIS — S72401D Unspecified fracture of lower end of right femur, subsequent encounter for closed fracture with routine healing: Secondary | ICD-10-CM | POA: Diagnosis not present

## 2017-05-04 ENCOUNTER — Telehealth: Payer: Self-pay | Admitting: Adult Health

## 2017-05-04 ENCOUNTER — Ambulatory Visit: Payer: Medicare Other | Admitting: Adult Health

## 2017-05-04 NOTE — Telephone Encounter (Signed)
Patient's sister Jeanne Ivan ([phone] "call if you would like more details")  came in today with her husband to see Dr Raliegh Ip; she would like to inform you that patient was declared incompetent on January 01, 2017 one week post surgery... Sunday Spillers states "her step-daughter wants to keep her with this incompetent status"    Sister states that patient is scared to speak freely with step-daughter in the room and they would like for you to assess her competency during visit. If pt is competent they would like a letter stating this and for pt's sister to pick this letter up after the visit.

## 2017-05-06 ENCOUNTER — Ambulatory Visit: Payer: Medicare Other | Admitting: Adult Health

## 2017-05-06 ENCOUNTER — Encounter: Payer: Self-pay | Admitting: Adult Health

## 2017-05-06 VITALS — BP 112/64 | Temp 97.8°F | Ht 60.0 in | Wt 90.0 lb

## 2017-05-06 DIAGNOSIS — F32A Depression, unspecified: Secondary | ICD-10-CM

## 2017-05-06 DIAGNOSIS — R7303 Prediabetes: Secondary | ICD-10-CM | POA: Diagnosis not present

## 2017-05-06 DIAGNOSIS — Z7689 Persons encountering health services in other specified circumstances: Secondary | ICD-10-CM | POA: Diagnosis not present

## 2017-05-06 DIAGNOSIS — G250 Essential tremor: Secondary | ICD-10-CM

## 2017-05-06 DIAGNOSIS — F329 Major depressive disorder, single episode, unspecified: Secondary | ICD-10-CM

## 2017-05-06 DIAGNOSIS — E43 Unspecified severe protein-calorie malnutrition: Secondary | ICD-10-CM

## 2017-05-06 NOTE — Progress Notes (Signed)
Patient presents to clinic today to establish care. 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  Her last physical was in 2016.   She presents with her sister to this visit   Acute Concerns: Establish Care   Chronic Issues: Anxiety and Depression - She takes Paxil and Seroquel. Feels as though it is controlled.   Vitamin D Deficiency/Ostepoprosis  - Takes 50,000 units weekly as well as calcium supplements   Hx cervical cancer - she was tx with XRT with radium implants and 2 weeks of cobalt in the 1970s. She has been cancer free x many years  Peripheral neuropathy- Has taken gabapentin in the past but made her feel "spacy" . Per chart, reportedly from heavy ETOH use. She has stopped heavy drinking. She has unsteady gait due to arthritic condition from right hip fx. She is using a walker to ambulate.   Essential Tremor - She takes Atenolol 12.5 mg daily.   Hx of pre diabetes. Diet controled.  Lab Results  Component Value Date   HGBA1C 5.9 (H) 10/09/2015       Health Maintenance: Dental -- Routine Care  Vision -- 2015  Immunizations -- Needs pneunonia vaccination  Colonoscopy --2016 ( I do not have these records).  Mammogram -- 2017    She is followed by   Orthopedics - Dr. Marlou Sa  Opthalmology - Dr. [place]      Past Medical History:  Diagnosis Date  . Cervical cancer (Jody Moore)   . Depression   . Essential tremor 10/04/2013  . Femur fracture (Jody Moore) 12/22/2016  . Femur fracture, right (Jody Moore) 12/21/2016  . Gastroesophageal reflux disease without esophagitis 07/10/2015  . 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 Osteoporosis   . Osteoporosis 07/10/2015  . 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 Reflux   . Severe episode of recurrent major depressive disorder, without psychotic features (Jody Moore) 07/10/2015  . Severe protein-calorie malnutrition (Jody Moore) 12/29/2016  . Situational anxiety 07/29/2016  . Traumatic compression fracture of T11 thoracic vertebra, with routine healing, subsequent encounter 12/29/2016    Past Surgical History:  Procedure Laterality Date  . ANKLE SURGERY    . CATARACT EXTRACTION    . [place]   cancer-radiation   . FEMUR SURGERY  2005   fractured right femur   . HIP SURGERY  2006   fractured right hip   . IR RADIOLOGIST EVAL & MGMT  01/14/2017  . ORIF FEMUR FRACTURE Right 12/22/2016   Procedure: OPEN REDUCTION INTERNAL FIXATION (ORIF) DISTAL FEMUR FRACTURE;  Surgeon: [name], MD;  Location: [name];  Service: Orthopedics;  Laterality: Right;  . TONSILLECTOMY      Current Outpatient Medications on File Prior to Visit  Medication Sig Dispense Refill  . acetaminophen (TYLENOL) 650 MG CR tablet Take 650 mg by mouth every 12 (twelve) hours as needed for pain.    [name] aspirin 325 MG tablet Take 325 mg by mouth daily.    [name] atenolol (TENORMIN) 25 MG tablet Take 0.5  tablets (12.5 mg total) by mouth daily. 30 tablet 2  . Calcium Carbonate-Vitamin D (CALCIUM + D PO) Take 1 tablet by mouth daily.     . cyanocobalamin 1000 MCG tablet Take 1,000 mcg by mouth daily.    . diphenhydrAMINE (BENADRYL) 25 mg capsule Take 25 mg by mouth every 6 (six) hours as needed.    . ferrous sulfate 325 (65 FE) MG tablet Take 325 mg by mouth daily with breakfast.    .  hydrOXYzine (ATARAX/VISTARIL) 25 MG tablet Take 25 mg by mouth 3 (three) times daily.    [name] loratadine (CLARITIN) 10 MG tablet Take 10 mg by mouth daily.    . Multiple Vitamin (MULTIVITAMIN) tablet Take 1 tablet by mouth daily.    . Nutritional Supplements (ENSURE ENLIVE PO) Take by mouth. 237 ml three times daily between meals for weight loss    . PARoxetine (PAXIL) 40 MG tablet TAKE 1 TABLET(40 MG) BY MOUTH DAILY 90 tablet 0  . QUEtiapine (SEROQUEL) 25 MG tablet Take 25 mg by mouth at bedtime.    . Vitamin D, Ergocalciferol, (DRISDOL) 50000 units CAPS capsule Take 50,000 Units by mouth every 7 (seven) days.     No current facility-administered medications on file prior to visit.     Allergies  Allergen Reactions  . Influenza Vaccines Hives    Hives/swelling/injection site war to the touch. No relief with benadryl   . Omeprazole Hives  . Coumadin [Warfarin Sodium] Rash    Family History  Problem Relation Age of Onset  . Heart disease Father     Social History   Socioeconomic History  . Marital status: Married    Spouse name: Not on file  . Number of children: Not on file  . Years of education: Not on file  . Highest education level: Not on file  Social Needs  . Financial resource strain: Not on file  . Food insecurity - worry: Not on file  . Food insecurity - inability: Not on file  . Transportation needs - medical: Not on file  . Transportation needs - non-medical: Not on file  Occupational History  . 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 Smoking status: Former Smoker    Packs/day: 2.00    Types: Cigarettes    Last attempt to quit: 06/08/2013    Years since quitting: 3.9  . Smokeless tobacco: Never Used  Substance and Sexual Activity  . Alcohol use: Yes    Comment: 3-4 glasses wine/day  . Drug use: No  . Sexual activity: No  Other Topics Concern  . Not on file  Social History Narrative   Diet:   Do you drink/eat things with caffeine? yes    Marital status:  Married                            What year were you married? 2015   Do you live in a house, apartment, assisted living, condo, trailer, etc)? house   Is it one or more stories? yes   How many persons live in your home? 2  Do you have any pets in your home?  2 cats   Current or past profession: Hospital doctor   Do you exercise?    no                                                 Type & how often:   Do you have a living will?  no   Do you have a DNR Form?  no   Do you have a POA/HPOA forms?  No   Admitted to [facility] 12/25/16   Former smoker-stopped 2015   Alcohol 3-4 glasses of wine daily    DNR    Review of Systems  Constitutional: Positive for weight loss.  HENT: Positive for hearing loss.   Respiratory: Negative.   Cardiovascular: Negative.   Gastrointestinal: Negative.   Genitourinary: Negative.   Musculoskeletal: Positive for back pain and joint pain.  Skin: Negative.   Neurological: Positive for tremors.  Psychiatric/Behavioral: Positive for depression and substance abuse (prior ETOH abuse ). Negative for suicidal ideas. The patient is nervous/anxious.     Temp 97.8 F (36.6 C) (Oral)   Ht 5' (1.524 m)   Wt 90 lb (40.8 kg)   BMI 17.58 kg/m   Physical Exam  Constitutional: She is oriented to person, place, and time and well-developed, well-nourished, and in no distress. She appears cachectic. No distress.  Eyes: Conjunctivae and EOM are normal. Pupils are equal, round, and reactive to light.  Cardiovascular: Normal rate, regular rhythm, normal heart sounds and intact distal pulses. Exam reveals no gallop and no friction rub.  No murmur heard. Pulmonary/Chest: Effort normal and breath sounds normal. No respiratory distress. She has no wheezes. She has no rales. She exhibits no tenderness.  Musculoskeletal:  Slow steady gait with walker     Neurological: She is alert and oriented to person, place, and time. Gait normal. GCS score is 15.  Skin: Skin is warm and dry. No rash noted. She is not diaphoretic. No erythema. No pallor.  Psychiatric: Mood, memory, affect and judgment normal.  Vitals reviewed.  Assessment/Plan: 1. Encounter to establish care - Follow up for CPE  - Follow up sooner if needed   2. Essential tremor - Continue with atenolol   3. Depression, unspecified depression type - Controlled at this time. No change in medications   4. Prediabetes - Diet controlled. Will continue to monitor   5. Severe protein-calorie malnutrition ([place]) - Encouraged high calorie/high protein foods  - Will continue to monitor   Jody Peng, NP

## 2017-05-06 NOTE — Patient Instructions (Signed)
It was great meeting you today   Please follow up with me for your physical. At that time we will check your blood work and do some cognitive testing for the lawyer.   If you need anything in the meantime, please let me know

## 2017-05-12 ENCOUNTER — Ambulatory Visit (INDEPENDENT_AMBULATORY_CARE_PROVIDER_SITE_OTHER): Payer: Medicare Other | Admitting: Orthopedic Surgery

## 2017-05-13 DIAGNOSIS — S72401D Unspecified fracture of lower end of right femur, subsequent encounter for closed fracture with routine healing: Secondary | ICD-10-CM | POA: Diagnosis not present

## 2017-06-03 ENCOUNTER — Encounter: Payer: Self-pay | Admitting: Adult Health

## 2017-06-03 ENCOUNTER — Other Ambulatory Visit: Payer: Self-pay | Admitting: Adult Health

## 2017-06-03 ENCOUNTER — Ambulatory Visit (INDEPENDENT_AMBULATORY_CARE_PROVIDER_SITE_OTHER): Payer: Medicare Other | Admitting: Adult Health

## 2017-06-03 VITALS — BP 122/62 | Temp 98.1°F | Ht 60.75 in | Wt 88.0 lb

## 2017-06-03 DIAGNOSIS — F329 Major depressive disorder, single episode, unspecified: Secondary | ICD-10-CM

## 2017-06-03 DIAGNOSIS — E43 Unspecified severe protein-calorie malnutrition: Secondary | ICD-10-CM | POA: Diagnosis not present

## 2017-06-03 DIAGNOSIS — Z0000 Encounter for general adult medical examination without abnormal findings: Secondary | ICD-10-CM | POA: Diagnosis not present

## 2017-06-03 DIAGNOSIS — R7303 Prediabetes: Secondary | ICD-10-CM

## 2017-06-03 DIAGNOSIS — M8000XD Age-related osteoporosis with current pathological fracture, unspecified site, subsequent encounter for fracture with routine healing: Secondary | ICD-10-CM | POA: Diagnosis not present

## 2017-06-03 DIAGNOSIS — G250 Essential tremor: Secondary | ICD-10-CM

## 2017-06-03 DIAGNOSIS — F32A Depression, unspecified: Secondary | ICD-10-CM

## 2017-06-03 LAB — CBC WITH DIFFERENTIAL/PLATELET
BASOS ABS: 0.1 10*3/uL (ref 0.0–0.1)
BASOS PCT: 1.2 % (ref 0.0–3.0)
EOS ABS: 0.3 10*3/uL (ref 0.0–0.7)
Eosinophils Relative: 3.9 % (ref 0.0–5.0)
HEMATOCRIT: 36.1 % (ref 36.0–46.0)
Hemoglobin: 11.7 g/dL — ABNORMAL LOW (ref 12.0–15.0)
LYMPHS ABS: 1.9 10*3/uL (ref 0.7–4.0)
LYMPHS PCT: 27.1 % (ref 12.0–46.0)
MCHC: 32.4 g/dL (ref 30.0–36.0)
MCV: 91.9 fl (ref 78.0–100.0)
MONO ABS: 0.5 10*3/uL (ref 0.1–1.0)
Monocytes Relative: 7 % (ref 3.0–12.0)
NEUTROS ABS: 4.4 10*3/uL (ref 1.4–7.7)
NEUTROS PCT: 60.8 % (ref 43.0–77.0)
PLATELETS: 378 10*3/uL (ref 150.0–400.0)
RBC: 3.93 Mil/uL (ref 3.87–5.11)
RDW: 15.3 % (ref 11.5–15.5)
WBC: 7.2 10*3/uL (ref 4.0–10.5)

## 2017-06-03 LAB — BASIC METABOLIC PANEL
BUN: 14 mg/dL (ref 6–23)
CALCIUM: 9.3 mg/dL (ref 8.4–10.5)
CHLORIDE: 101 meq/L (ref 96–112)
CO2: 26 meq/L (ref 19–32)
CREATININE: 0.61 mg/dL (ref 0.40–1.20)
GFR: 101.3 mL/min (ref >60.00)
GLUCOSE: 95 mg/dL (ref 70–99)
Potassium: 4.8 mEq/L (ref 3.5–5.1)
Sodium: 137 mEq/L (ref 135–145)

## 2017-06-03 LAB — LIPID PANEL
Cholesterol: 218 mg/dL — ABNORMAL HIGH (ref 0–200)
HDL: 60.3 mg/dL (ref >39.00)
NONHDL: 157.93
TRIGLYCERIDES: 308 mg/dL — AB (ref 0.0–149.0)
Total CHOL/HDL Ratio: 4
VLDL: 61.6 mg/dL — AB (ref 0.0–40.0)

## 2017-06-03 LAB — VITAMIN D 25 HYDROXY (VIT D DEFICIENCY, FRACTURES): VITD: 36.61 ng/mL (ref 30.00–100.00)

## 2017-06-03 LAB — HEPATIC FUNCTION PANEL
ALK PHOS: 82 U/L (ref 39–117)
ALT: 17 U/L (ref 0–35)
AST: 21 U/L (ref 0–37)
Albumin: 3.8 g/dL (ref 3.5–5.2)
BILIRUBIN DIRECT: 0.1 mg/dL (ref 0.0–0.3)
TOTAL PROTEIN: 6.7 g/dL (ref 6.0–8.3)
Total Bilirubin: 0.8 mg/dL (ref 0.2–1.2)

## 2017-06-03 LAB — TSH: TSH: 3.15 u[IU]/mL (ref 0.35–4.50)

## 2017-06-03 LAB — LDL CHOLESTEROL, DIRECT: Direct LDL: 125 mg/dL

## 2017-06-03 MED ORDER — TOPIRAMATE 25 MG PO TABS: 25.0000 mg | ORAL_TABLET | Freq: Two times a day (BID) | ORAL | 3 refills | Status: DC

## 2017-06-03 MED ORDER — QUETIAPINE FUMARATE 25 MG PO TABS: 25.0000 mg | ORAL_TABLET | Freq: Every day | ORAL | 1 refills | Status: DC

## 2017-06-03 NOTE — Progress Notes (Signed)
Subjective:    Patient ID: Jody Moore, female    DOB: Aug 05, 1940, 76 y.o.   MRN: [id number]  HPI  Patient presents for yearly preventative medicine examination. 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   She presents with her sister today to this visit.   Anxiety and Depression - She takes Paxil and Seroquel. Feels as though it is controlled. She reports that she found out yesterday that her husband needed to be put on hospice care and only has a few days to live.   Vitamin D Deficiency/Ostepoprosis  - Takes 50,000 units weekly as well as calcium supplements   Hx cervical cancer - she was tx with XRT with radium implants and 2 weeks of cobalt in the 1970s. She has been cancer free x many years  Peripheral neuropathy- Has taken gabapentin in the past but made her feel "spacy" . Per chart, reportedly from heavy ETOH use. She has stopped heavy drinking. She has unsteady gait due to arthritic condition from right hip fx. She is using a walker to ambulate.   Essential Tremor - She was taking Atenolol for this but was not noticing any changes in her tremor so she stopped this about 3 weeks ago.   Hx of pre diabetes. Diet controled.   All immunizations and health maintenance protocols were reviewed with the patient and  needed orders were placed.  Appropriate screening laboratory values were ordered for the patient including screening of hyperlipidemia, renal function and hepatic function.  Medication reconciliation,  past medical history, social history, problem list and allergies were reviewed in detail with the patient  Goals were established with regard to weight loss, exercise, and  diet in compliance with medications  End of life planning was discussed.She has an advanced directive and living will.   She no longer needs a colonoscopy.  She does routine dental care. She has not had a vision screen since 2015.   She needs a letter today for her lawyer stating that she is competent to make her own decisions. She currently has her two step daughters making her decisions for her as she was found to be incompetent in July 2018 when she was suffering from depression? She is currently living with her mother, who is 34 years old.    Review of Systems  Constitutional: Negative.   HENT: Positive for hearing loss.   Eyes: Negative.   Respiratory: Negative.   Cardiovascular: Negative.   Gastrointestinal: Negative.   Endocrine: Negative.   Genitourinary: Negative.   Musculoskeletal: Positive for arthralgias.  Skin: Negative.   Allergic/Immunologic: Negative.   Neurological: Positive for tremors.  Hematological: Negative.   Psychiatric/Behavioral: Negative for behavioral problems, confusion, decreased concentration, sleep disturbance and suicidal ideas. The patient is not nervous/anxious.        Depressed   All other systems reviewed and are negative.  Past Medical History:  Diagnosis Date  . Cervical cancer ([place])   . Depression   . Essential tremor 10/04/2013  . Femur fracture ([place]) 12/22/2016  .  Femur fracture, right ([place]) 12/21/2016  . Gastroesophageal reflux disease without esophagitis 07/10/2015  . 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 Osteoporosis 07/10/2015  . peripheral neuropathy due to  alcohol 10/04/2013  . Reflux   . Severe episode of recurrent major depressive disorder, without psychotic features ([place]) 07/10/2015  . Severe protein-calorie malnutrition ([place]) 12/29/2016  . Situational anxiety 07/29/2016  . Traumatic compression fracture of T11 thoracic vertebra, with routine healing, subsequent encounter 12/29/2016    Social History   Socioeconomic History  . Marital status: Married    Spouse name: Not on file  . Number of children: Not on file  . Years of education: Not on file  . Highest education level: Not on file  Social Needs  . Financial resource strain: Not on file  . Food insecurity - worry: Not on file  . Food insecurity - inability: Not on file  . Transportation needs - medical: Not on file  . Transportation needs - non-medical: Not on file  Occupational History  . 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 Smoking status: Former Smoker    Packs/day: 2.00    Types: Cigarettes    Last attempt to quit: 12/2016    Years since quitting: 0.4  . Smokeless tobacco: Never Used  Substance and Sexual Activity  . Alcohol use: No    Frequency: Never  . Drug use: No  . Sexual activity: No  Other Topics Concern  . Not on file  Social History Narrative   Diet:   Do you drink/eat things with caffeine? yes   Marital status:  Married                            What year were you married? 2015   Do you live in a house, apartment, assisted living, condo, trailer, etc)? house   Is it one or more stories? yes   How many persons live in your home? 2                                                                                               Do you have any pets in your home?  2 cats   Current or past profession: Hospital doctor   Do you exercise?    no                                                 Type & how often:   Do you have a living will?  no   Do you have a DNR Form?  no   Do you have a POA/HPOA forms?  No   Admitted to Home 12/25/16      DNR    Past Surgical History:  Procedure Laterality Date  . ANKLE SURGERY    . CATARACT EXTRACTION Bilateral   . CERVIX SURGERY  1980   cancer-radiation   .  FEMUR SURGERY  2005   fractured right femur   . HIP SURGERY  2006   fractured right hip   . IR RADIOLOGIST EVAL & MGMT  01/14/2017  . ORIF FEMUR FRACTURE Right 12/22/2016   Procedure: OPEN REDUCTION INTERNAL FIXATION (ORIF) DISTAL FEMUR FRACTURE;  Surgeon: [name], MD;  Location: [facility];  Service: Orthopedics;  Laterality: Right;  . TONSILLECTOMY      Family History  Problem Relation Age of Onset  . Heart disease Father   . Bladder Cancer Father   . Diabetes Father     Allergies  Allergen Reactions  . Influenza Vaccines Hives    Hives/swelling/injection site war to the touch. No relief with benadryl   . Omeprazole Hives  . Codeine Sulfate Rash    Current Outpatient Medications on File Prior to Visit  Medication Sig Dispense Refill  . acetaminophen (TYLENOL) 650 MG CR tablet Take 650 mg by mouth every 12 (twelve) hours as needed for pain.    [name] aspirin 325 MG tablet Take 325 mg by mouth daily.    . Calcium Carbonate-Vitamin D (CALCIUM + D PO) Take 1 tablet by mouth daily.     . cyanocobalamin 1000 MCG tablet Take 1,000 mcg by mouth daily.    . diphenhydrAMINE (BENADRYL) 25 mg capsule Take 25 mg by mouth every 6 (six) hours as needed.    . hydrOXYzine (ATARAX/VISTARIL) 25 MG tablet Take 25 mg by mouth 3 (three) times daily.    [name] loratadine (CLARITIN) 10 MG tablet Take 10 mg by mouth daily.    . Multiple Vitamin (MULTIVITAMIN) tablet Take 1 tablet by mouth daily.    . Nutritional Supplements (ENSURE ENLIVE PO) Take by mouth. 237 ml three times daily between meals for weight loss    . PARoxetine (PAXIL) 40 MG tablet TAKE 1 TABLET(40 MG) BY MOUTH DAILY 90 tablet 0  . Vitamin D, Ergocalciferol, (DRISDOL) 50000 units CAPS capsule Take 50,000 Units by mouth every 7 (seven) days.     No current  facility-administered medications on file prior to visit.     BP 122/62 (BP Location: Left Arm)   Temp 98.1 F (36.7 C) (Oral)   Ht 5' 0.75" (1.543 m)   Wt 88 lb (39.9 kg)   BMI 16.76 kg/m       Objective:   Physical Exam  Constitutional: She is oriented to person, place, and time. Vital signs are normal. She appears well-developed. She appears cachectic. No distress.  HENT:  Head: Normocephalic and atraumatic.  Right Ear: External ear normal.  Left Ear: External ear normal.  Nose: Nose normal.  Mouth/Throat: Oropharynx is clear and moist. No oropharyngeal exudate.  Eyes: Conjunctivae and EOM are normal. Pupils are equal, round, and reactive to light. Right eye exhibits no discharge. Left eye exhibits no discharge. No scleral icterus.  Neck: Normal range of motion. Neck supple. No JVD present. No tracheal deviation present. No thyromegaly present.  Cardiovascular: Normal rate, regular rhythm, normal heart sounds and intact distal pulses. Exam reveals no gallop and no friction rub.  No murmur heard. Pulmonary/Chest: Effort normal and breath sounds normal. No stridor. No respiratory distress. She has no wheezes. She has no rales. She exhibits no tenderness.  Abdominal: Soft. Bowel sounds are normal. She exhibits no distension and no mass. There is no tenderness. There is no rebound and no guarding.  Musculoskeletal: Normal range of motion. She exhibits no edema, tenderness or deformity.  Walks with a rolling walker  Lymphadenopathy:    She has no cervical adenopathy.  Neurological: She is alert and oriented to person, place, and time. She has normal reflexes. She displays normal reflexes. No cranial nerve deficit. She exhibits normal muscle tone. Coordination normal.  Skin: Skin is warm and dry. No rash noted. She is not diaphoretic. No erythema. No pallor.  Psychiatric: She has a normal mood and affect. Her behavior is normal. Judgment and thought content normal.  Nursing note and  vitals reviewed.     Assessment & Plan:  1. Routine general medical examination at a health care facility - Mini mental status exam score 28/30. She appears competent to make her own decisions. Letter written  - Follow up in one year for next CPE or sooner if needed - Basic metabolic panel - CBC with Differential/Platelet - Hepatic function panel - Lipid panel - TSH - Vitamin D, 25-hydroxy  2. Depression, unspecified depression type - Stable  - Basic metabolic panel - CBC with Differential/Platelet - Hepatic function panel - Lipid panel - TSH - Vitamin D, 25-hydroxy - QUEtiapine (SEROQUEL) 25 MG tablet; Take 1 tablet (25 mg total) by mouth at bedtime.  Dispense: 90 tablet; Refill: 1  3. Severe protein-calorie malnutrition ([place]) - encouraged high protein food sources.  - Basic metabolic panel - CBC with Differential/Platelet - Hepatic function panel - Lipid panel - TSH - Vitamin D, 25-hydroxy  4. Prediabetes - Basic metabolic panel - CBC with Differential/Platelet - Hepatic function panel - Lipid panel - TSH  5. Age-related osteoporosis with current pathological fracture with routine healing, subsequent encounter - Basic metabolic panel - CBC with Differential/Platelet - Hepatic function panel - Lipid panel - TSH - Vitamin D, 25-hydroxy  6. Essential tremor - Will trial on Topamax 25 mg daily/  - Follow up in 1-2 months or sooner if needed - Basic metabolic panel - CBC with Differential/Platelet - Hepatic function panel - Lipid panel - TSH - Vitamin D, 25-hydroxy - topiramate (TOPAMAX) 25 MG tablet; Take 1 tablet (25 mg total) by mouth 2 (two) times daily.  Dispense: 30 tablet; Refill: 3   [name], NP

## 2017-06-03 NOTE — Patient Instructions (Signed)
It was great seeing you today   I will follow up with you regarding your labs   I have prescribed a medication called Topamax to help with your tremor. Please follow up with me in 1-2 months to see how you are doing with this meidcation

## 2017-06-04 NOTE — Telephone Encounter (Signed)
Sent in 06/03/17 by Tommi Rumps for 30 day supply.  He sent in for 4 months.   Pt now requesting 90 day supply.  Sent in for 3 months.

## 2017-06-09 ENCOUNTER — Ambulatory Visit: Payer: Medicare Other | Admitting: Neurology

## 2017-06-13 DIAGNOSIS — S72401D Unspecified fracture of lower end of right femur, subsequent encounter for closed fracture with routine healing: Secondary | ICD-10-CM | POA: Diagnosis not present

## 2017-06-23 ENCOUNTER — Ambulatory Visit: Payer: Medicare Other | Admitting: Adult Health

## 2017-06-24 ENCOUNTER — Other Ambulatory Visit: Payer: Self-pay | Admitting: Adult Health

## 2017-06-24 NOTE — Telephone Encounter (Signed)
Cory, no strength on the calcium and vit d tab.  Calcium 1200 and Vit D 1000?

## 2017-06-24 NOTE — Telephone Encounter (Signed)
Pt need refill on Vitamin B-12 1018mcg tab, Hydroxyzine HCL 25 mg tab and Calcium Carbonate Vitamin D.  Pt is out of these medication and would like to see if she can get these today.  Pharm:  [facility].

## 2017-06-24 NOTE — Telephone Encounter (Signed)
Jody Moore, you have not prescribed these medications in the past.  Please advise.

## 2017-06-24 NOTE — Telephone Encounter (Signed)
Ok to refill all for one year

## 2017-06-25 MED ORDER — CYANOCOBALAMIN 1000 MCG PO TABS: 1000.0000 ug | ORAL_TABLET | Freq: Every day | ORAL | 3 refills | Status: DC

## 2017-06-25 MED ORDER — CALCIUM/VITAMIN D 600-400 MG-UNIT PO TABS: 2.0000 | ORAL_TABLET | Freq: Every day | ORAL | 3 refills | Status: DC

## 2017-06-25 MED ORDER — HYDROXYZINE HCL 25 MG PO TABS: 25.0000 mg | ORAL_TABLET | Freq: Three times a day (TID) | ORAL | 3 refills | Status: DC

## 2017-06-25 NOTE — Telephone Encounter (Signed)
That is fine,   Thanks

## 2017-06-25 NOTE — Telephone Encounter (Signed)
Sent to the pharmacy by e-scribe. 

## 2017-07-07 ENCOUNTER — Encounter: Payer: Self-pay | Admitting: Adult Health

## 2017-07-07 ENCOUNTER — Ambulatory Visit: Payer: Medicare Other | Admitting: Adult Health

## 2017-07-07 VITALS — BP 120/82 | Temp 97.4°F | Ht 60.05 in | Wt 84.0 lb

## 2017-07-07 DIAGNOSIS — Z760 Encounter for issue of repeat prescription: Secondary | ICD-10-CM

## 2017-07-07 DIAGNOSIS — E43 Unspecified severe protein-calorie malnutrition: Secondary | ICD-10-CM | POA: Diagnosis not present

## 2017-07-07 DIAGNOSIS — F329 Major depressive disorder, single episode, unspecified: Secondary | ICD-10-CM | POA: Diagnosis not present

## 2017-07-07 DIAGNOSIS — R441 Visual hallucinations: Secondary | ICD-10-CM | POA: Diagnosis not present

## 2017-07-07 DIAGNOSIS — F32A Depression, unspecified: Secondary | ICD-10-CM

## 2017-07-07 MED ORDER — QUETIAPINE FUMARATE 50 MG PO TABS: 50.0000 mg | ORAL_TABLET | Freq: Every day | ORAL | 1 refills | Status: DC

## 2017-07-07 MED ORDER — VITAMIN D (ERGOCALCIFEROL) 1.25 MG (50000 UNIT) PO CAPS: 50000.0000 [IU] | ORAL_CAPSULE | ORAL | 1 refills | Status: AC

## 2017-07-07 NOTE — Progress Notes (Signed)
Subjective:    Patient ID: Jody Moore, female    DOB: [birth year], 77 y.o.   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  She presents to the office today for follow up regarding depression.  She is with her sister at this visit  Last saw Jody Moore on June 03, 2017 she informed me that she had just placed her husband in hospice care, sadly he passed 3 days later.  Jody Moore currently reports being stricken with grief and increased depression since her husband's passing.  She reports that she has been spending a lot of time in bed and sleeping a lot and also that her appetite has been worse than it was previously.  Has been drinking Ensure liquid but has found that this causes her to have diarrhea so she has since quit the dietary supplement.    She is not seeking any grief counseling through hospice although she realizes this might be a good option for her.  Denies any suicidal ideation  He has recently moved in with her sister that since her husband died and her sister has been supervising her medications and making sure that she is taking them properly.  Jody Moore is thankful that she is living with her sister and feels as though her sister will help her get back to her normal self.  Sister reports that Jody Moore has been  experiencing more visual hallucinations been what has happened in the past and Jody Moore does endorse this as well.  She does not report taking Seroquel daily but when she was living in a facility there might of been extended stretches of time where she was not taking Seroquel as she is normally should  Her sister reports that in the move that scribe Seroquel and vitamin D went missing, so they need a refill of this medication  Review of Systems See HPI   Past Medical History:  Diagnosis Date  . Cervical cancer ([place])   . Depression   . Essential tremor 10/04/2013  . Femur fracture ([place]) 12/22/2016  . Femur fracture, right ([place]) 12/21/2016  . Gastroesophageal reflux disease without esophagitis 07/10/2015  . 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 Osteoporosis 07/10/2015  . 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 Reflux   . Severe episode of recurrent major depressive disorder, without psychotic features ([place]) 07/10/2015  . Severe protein-calorie malnutrition ([place]) 12/29/2016  . Situational anxiety 07/29/2016  . Traumatic compression fracture of T11 thoracic vertebra, with routine healing, subsequent encounter 12/29/2016    Social History   Socioeconomic History  . Marital status: Married    Spouse name: Not on file  . Number of children: Not on file  . Years of education: Not on file  . Highest education level: Not on file  Social Needs  . Financial resource strain: Not on file  . Food insecurity - worry: Not on file  . Food insecurity - inability: Not on file  . Transportation needs - medical: Not on file  . Transportation needs -  non-medical: Not on file  Occupational History  . 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 Smoking status: Former Smoker    Packs/day: 2.00    Types: Cigarettes    Last attempt to quit: 12/2016    Years since quitting: 0.5  . Smokeless tobacco: Never Used  Substance and Sexual Activity  . Alcohol use: No    Frequency:  Never  . Drug use: No  . Sexual activity: No  Other Topics Concern  . Not on file  Social History Narrative   Diet:   Do you drink/eat things with caffeine? yes   Marital status:  Married                            What year were you married? 2015   Do you live in a house, apartment, assisted living, condo, trailer, etc)? house   Is it one or more stories? yes   How many persons live in your home? 2                                                                                               Do you have any pets in your home?  2 cats   Current or past profession: Hospital doctor   Do you exercise?    no                                                 Type & how often:   Do you have a living will?  no   Do you have a DNR Form?  no   Do you have a POA/HPOA forms?  No   Admitted to [facility] 12/25/16      DNR    Past Surgical History:  Procedure Laterality Date  . ANKLE SURGERY    . CATARACT EXTRACTION Bilateral   . CERVIX SURGERY  1980   cancer-radiation   . FEMUR SURGERY  2005   fractured right femur   . HIP SURGERY  2006   fractured right hip   . IR RADIOLOGIST EVAL & MGMT  01/14/2017  . ORIF FEMUR FRACTURE Right 12/22/2016   Procedure: OPEN REDUCTION INTERNAL FIXATION (ORIF) DISTAL FEMUR FRACTURE;  Surgeon: [name], MD;  Location: [facility];  Service: Orthopedics;  Laterality: Right;  . TONSILLECTOMY      Family History  Problem Relation Age of Onset  . Heart disease Father   . Bladder Cancer Father   . Diabetes Father     Allergies  Allergen Reactions  . Influenza Vaccines Hives    Hives/swelling/injection site war to the touch. No relief with benadryl   . Omeprazole Hives  . Codeine Sulfate Rash    Current Outpatient Medications on File Prior to Visit  Medication Sig Dispense Refill  . acetaminophen (TYLENOL) 650 MG CR tablet Take 650 mg by mouth every 12 (twelve)  hours as needed for pain.    [name] aspirin 325 MG tablet Take 325 mg by mouth  daily.    . Calcium Carb-Cholecalciferol (CALCIUM/VITAMIN D) 600-400 MG-UNIT TABS Take 2 tablets by mouth daily. 180 tablet 3  . cyanocobalamin 1000 MCG tablet Take 1 tablet (1,000 mcg total) by mouth daily. 90 tablet 3  . diphenhydrAMINE (BENADRYL) 25 mg capsule Take 25 mg by mouth every 6 (six) hours as needed.    . hydrOXYzine (ATARAX/VISTARIL) 25 MG tablet Take 1 tablet (25 mg total) by mouth 3 (three) times daily. 270 tablet 3  . loratadine (CLARITIN) 10 MG tablet Take 10 mg by mouth daily.    . Multiple Vitamin (MULTIVITAMIN) tablet Take 1 tablet by mouth daily.    . Nutritional Supplements (ENSURE ENLIVE PO) Take by mouth. 237 ml three times daily between meals for weight loss    . PARoxetine (PAXIL) 40 MG tablet TAKE 1 TABLET(40 MG) BY MOUTH DAILY 90 tablet 0  . QUEtiapine (SEROQUEL) 25 MG tablet Take 1 tablet (25 mg total) by mouth at bedtime. 90 tablet 1  . topiramate (TOPAMAX) 25 MG tablet TAKE 1 TABLET BY MOUTH TWICE DAILY 180 tablet 0  . Vitamin D, Ergocalciferol, (DRISDOL) 50000 units CAPS capsule Take 50,000 Units by mouth every 7 (seven) days.     No current facility-administered medications on file prior to visit.     BP 120/82 (BP Location: Left Arm, Patient Position: Sitting, Cuff Size: Normal)   Temp (!) 97.4 F (36.3 C) (Oral)   Ht 5' 0.05" (1.525 m)   Wt 84 lb (38.1 kg)   BMI 16.38 kg/m       Objective:   Physical Exam  Constitutional: She is oriented to person, place, and time. She appears well-developed. She appears cachectic. No distress.  Cardiovascular: Normal rate, regular rhythm, normal heart sounds and intact distal pulses. Exam reveals no gallop and no friction rub.  No murmur heard. Pulmonary/Chest: Effort normal and breath sounds normal. No respiratory distress. She has no wheezes. She has no rales. She exhibits no tenderness.  Musculoskeletal:  Slow steady gait with a rolling walker  Neurological: She is alert and oriented to person, place, and  time.  Skin: Skin is warm and dry. No rash noted. She is not diaphoretic. No erythema. No pallor.  Psychiatric: She has a normal mood and affect. Her behavior is normal. Judgment and thought content normal.  Nursing note and vitals reviewed.     Assessment & Plan:  1. Depression, unspecified depression type -Encouraged Jody Moore to reach out to hospice for grief counseling -Encouraged [place] to spend her days out of her bedroom, she can go with her sister are shopping trips to the grocery store department store. - QUEtiapine (SEROQUEL) 50 MG tablet; Take 1 tablet (50 mg total) by mouth at bedtime.  Dispense: 90 tablet; Refill: 1 -Like her to follow-up in 30 days for reevaluation 2. Severe protein-calorie malnutrition ([place]) -Advised to try Ensure Clear -Work on advancing diet to high protein foods  3. Visual hallucinations -Increase Seroquel to 50 mg at bedtime - QUEtiapine (SEROQUEL) 50 MG tablet; Take 1 tablet (50 mg total) by mouth at bedtime.  Dispense: 90 tablet; Refill: 1  4. Medication refill  - QUEtiapine (SEROQUEL) 50 MG tablet; Take 1 tablet (50 mg total) by mouth at bedtime.  Dispense: 90 tablet; Refill: 1 - Vitamin D, Ergocalciferol, (DRISDOL) 50000 units CAPS capsule; Take 1 capsule (50,000 Units total) by mouth every 7 (seven) days.  Dispense: 25 capsule; Refill: 1  [name], NP

## 2017-07-13 ENCOUNTER — Encounter: Payer: Self-pay | Admitting: Adult Health

## 2017-07-26 ENCOUNTER — Encounter: Payer: Self-pay | Admitting: Adult Health

## 2017-08-05 ENCOUNTER — Ambulatory Visit: Payer: Medicare Other | Admitting: Adult Health

## 2017-08-05 ENCOUNTER — Encounter: Payer: Self-pay | Admitting: Adult Health

## 2017-08-05 VITALS — BP 110/80 | Wt 82.0 lb

## 2017-08-05 DIAGNOSIS — R627 Adult failure to thrive: Secondary | ICD-10-CM

## 2017-08-05 MED ORDER — DRONABINOL 2.5 MG PO CAPS: 2.5000 mg | ORAL_CAPSULE | Freq: Two times a day (BID) | ORAL | 0 refills | Status: DC

## 2017-08-05 NOTE — Progress Notes (Signed)
Subjective:    Patient ID: Jody Moore, female    DOB: 1940/12/12, 77 y.o.   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  Presents to the office today for one-month follow-up regarding depression.  During her last visit she was grief stricken from the death of her husband 3 days prior.  At this time she was experiencing visual hallucinations and it was not clear if she was taking prescribed medications.  She had since moved in with her sister who is helping manage medications.  Seroquel was increased to 50 mg nightly.  Today in the office she reports that she continues to have hallucinations but they have become less frequent. She also feels as though her depression has improved.   Her sister is with her at this appointment is wondering if it would be possible for Jody Moore to get home health aide to help with personal hygiene issues.  And they have notified her insurance and were told that they can get a healthcare aide for 35 hours a week.  Jody Moore reports that she feels as though her appetite is good but she eats very little.  She continues to drink Ensure but it has been causing her to have diarrhea.  She reports drinking a lot of chicken noodle soup and eating pizzas.  Despite this effort her weight is down another 2 pounds to 82  pounds   Review of Systems See HPI   Past Medical History:  Diagnosis Date  . Cervical cancer ([place])   . Depression   . Essential tremor 10/04/2013  . Femur fracture ([place]) 12/22/2016  . Femur fracture, right ([place]) 12/21/2016  . Gastroesophageal reflux disease without esophagitis 07/10/2015  . 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 Osteoporosis 07/10/2015  . 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 Reflux   . Severe episode of recurrent major depressive disorder, without psychotic features ([place]) 07/10/2015  . Severe protein-calorie malnutrition ([place]) 12/29/2016  . Situational anxiety 07/29/2016  . Traumatic compression fracture of T11 thoracic vertebra, with routine healing, subsequent encounter 12/29/2016    Social History   Socioeconomic History  . Marital status: Married    Spouse name: Not on file  . Number of children: Not on file  . Years of education: Not on file  . Highest education level: Not on file  Social Needs  . Financial resource strain: Not on file  . Food insecurity - worry: Not on file  . Food insecurity - inability: Not on file  . Transportation needs - medical: Not on file  . Transportation needs - non-medical: Not on file  Occupational History  . 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 Smoking status: Former Smoker    Packs/day: 2.00    Types: Cigarettes    Last attempt to quit: 12/2016    Years since quitting: 0.6  . Smokeless tobacco: Never Used  Substance and Sexual Activity  . Alcohol use: No    Frequency: Never  . Drug use: No  .  Sexual activity: No  Other Topics Concern  . Not on file  Social History Narrative   Diet:   Do you drink/eat things with caffeine? yes   Marital status:  Married                            What year were you married? 2015   Do you live in a house, apartment, assisted living, condo, trailer, etc)? house   Is it one or more stories? yes   How many persons live in your home? 2                                                                                                Do you have any pets in your home?  2 cats   Current or past profession: Hospital doctor   Do you exercise?    no                                                 Type & how often:   Do you have a living will?  no   Do you have a DNR Form?  no   Do you have a POA/HPOA forms?  No   Admitted to [facility] 12/25/16      DNR    Past Surgical History:  Procedure Laterality Date  . ANKLE SURGERY    . CATARACT EXTRACTION Bilateral   . CERVIX SURGERY  1980   cancer-radiation   . FEMUR SURGERY  2005   fractured right femur   . HIP SURGERY  2006   fractured right hip   . IR RADIOLOGIST EVAL & MGMT  01/14/2017  . ORIF FEMUR FRACTURE Right 12/22/2016   Procedure: OPEN REDUCTION INTERNAL FIXATION (ORIF) DISTAL FEMUR FRACTURE;  Surgeon: [name], MD;  Location: [facility];  Service: Orthopedics;  Laterality: Right;  . TONSILLECTOMY      Family History  Problem Relation Age of Onset  . Heart disease Father   . Bladder Cancer Father   . Diabetes Father     Allergies  Allergen Reactions  . Influenza Vaccines Hives    Hives/swelling/injection site war to the touch. No relief with benadryl   . Omeprazole Hives  . Codeine Sulfate Rash    Current Outpatient Medications on File Prior to Visit  Medication Sig Dispense Refill  . acetaminophen (TYLENOL) 650 MG CR tablet Take 650 mg by mouth every 12 (twelve) hours as needed for pain.    [name] aspirin 325 MG tablet Take 325 mg by mouth daily.    . Melatonin 3 MG TABS Take 1 tablet by mouth daily.    [name] PARoxetine (PAXIL) 40 MG tablet TAKE 1 TABLET(40 MG) BY MOUTH DAILY 90 tablet 0  . QUEtiapine (SEROQUEL) 50 MG tablet Take 1 tablet (50 mg total) by mouth at bedtime. 90 tablet 1  . topiramate (TOPAMAX) 25 MG tablet  TAKE 1 TABLET BY MOUTH TWICE DAILY 180 tablet 0  . Vitamin D, Ergocalciferol, (DRISDOL) 50000 units CAPS capsule Take 1 capsule (50,000  Units total) by mouth every 7 (seven) days. 25 capsule 1   No current facility-administered medications on file prior to visit.     BP 110/80 (BP Location: Left Arm)   Wt 82 lb (37.2 kg)   BMI 15.99 kg/m       Objective:   Physical Exam  Constitutional: She is oriented to person, place, and time. She appears well-developed and well-nourished. She appears cachectic. No distress.  Cardiovascular: Normal rate, regular rhythm, normal heart sounds and intact distal pulses. Exam reveals no gallop and no friction rub.  No murmur heard. Pulmonary/Chest: Effort normal and breath sounds normal. No respiratory distress. She has no wheezes. She has no rales. She exhibits no tenderness.  Musculoskeletal: Normal range of motion. She exhibits no edema, tenderness or deformity.  Slow steady gait with walker  Neurological: She is alert and oriented to person, place, and time.  Skin: Skin is warm and dry. No rash noted. She is not diaphoretic. No erythema. No pallor.  Psychiatric: She has a normal mood and affect. Her behavior is normal. Judgment and thought content normal.  Nursing note and vitals reviewed.     Assessment & Plan:  1. Failure to thrive in adult -We will prescribe Marinol 2-1/2 mg twice daily to help stimulate appetite.  We talked about palliative care she is okay with meeting with them to discuss their services.  I do not think she is a candidate for hospice but is probably not far off.  We will also refer her to home health see if they can aid her in personal hygiene issues.  I would like her to follow-up in 1 month - dronabinol (MARINOL) 2.5 MG capsule; Take 1 capsule (2.5 mg total) by mouth 2 (two) times daily before lunch and supper.  Dispense: 60 capsule; Refill: 0 - Ambulatory referral to [facility] - Amb Referral to [place], NP

## 2017-08-11 ENCOUNTER — Encounter: Payer: Self-pay | Admitting: Adult Health

## 2017-08-13 ENCOUNTER — Telehealth: Payer: Self-pay | Admitting: *Deleted

## 2017-08-13 NOTE — Telephone Encounter (Signed)
Prior auth for Dronabinol 2.5mg  capsules sent to [email or web address].

## 2017-08-13 NOTE — Telephone Encounter (Signed)
Note in [email or web address].[email or web address] state the request was denied.  Request Reference Number: [id number]. DRONABINOL CAP 2.5MG  is denied due to Plan Exclusion and this was forwarded to Cory's asst.

## 2017-08-18 ENCOUNTER — Encounter: Payer: Self-pay | Admitting: Adult Health

## 2017-08-18 ENCOUNTER — Other Ambulatory Visit: Payer: Self-pay | Admitting: Family Medicine

## 2017-08-18 DIAGNOSIS — R627 Adult failure to thrive: Secondary | ICD-10-CM

## 2017-08-18 NOTE — Telephone Encounter (Signed)
This medication was denied by her insurance.

## 2017-08-18 NOTE — Telephone Encounter (Signed)
NOTED.  MESSAGE SENT TO THE PHARMACY TO [facility].

## 2017-08-20 ENCOUNTER — Encounter: Payer: Self-pay | Admitting: Adult Health

## 2017-08-22 ENCOUNTER — Encounter: Payer: Self-pay | Admitting: Adult Health

## 2017-08-27 ENCOUNTER — Telehealth: Payer: Self-pay

## 2017-08-27 DIAGNOSIS — R262 Difficulty in walking, not elsewhere classified: Secondary | ICD-10-CM

## 2017-08-27 DIAGNOSIS — R627 Adult failure to thrive: Secondary | ICD-10-CM

## 2017-08-27 NOTE — Telephone Encounter (Signed)
Order for HH PT placed

## 2017-08-27 NOTE — Telephone Encounter (Signed)
Ok for referral to PT 

## 2017-08-27 NOTE — Telephone Encounter (Signed)
Shirley @ Palliative Care states that pt has improved some but is still using a walker and is somewhat unsteady. She is asking for PT referral. Pt has signed DNR and MOST form and has agreed to have only comfort care when begins to decline.   Cory - Please advise on order for PT. Thanks!

## 2017-08-30 ENCOUNTER — Encounter: Payer: Self-pay | Admitting: Adult Health

## 2017-09-01 ENCOUNTER — Encounter: Payer: Self-pay | Admitting: Adult Health

## 2017-09-02 ENCOUNTER — Ambulatory Visit: Payer: Medicare Other | Admitting: Adult Health

## 2017-09-02 ENCOUNTER — Telehealth: Payer: Self-pay | Admitting: Adult Health

## 2017-09-02 ENCOUNTER — Encounter: Payer: Self-pay | Admitting: Adult Health

## 2017-09-02 VITALS — BP 120/72 | Temp 97.8°F | Wt 87.0 lb

## 2017-09-02 DIAGNOSIS — R627 Adult failure to thrive: Secondary | ICD-10-CM | POA: Diagnosis not present

## 2017-09-02 NOTE — Patient Instructions (Signed)
It was great seeing you today   Please keep up the good work   Lets follow up in 2-3 months to see how everything is going

## 2017-09-02 NOTE — Telephone Encounter (Signed)
Copied from [place] [id number]. Topic: General - Other >> Sep 02, 2017  5:14 PM Cecelia Byars, NT wrote: Reason for CRM: [facility] called and would like to let the practice  know they are having trouble getting in touch with her , and she was referred to palliative care and home health ,

## 2017-09-02 NOTE — Progress Notes (Signed)
Subjective:    Patient ID: Jody Moore, female    DOB: 21-Aug-1940, 77 y.o.   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  She presents to the office today for one month follow up regarding failure to thrive. During her last visit she was prescribed Marinol but her insurance would not cover it. She reports that her sister is cooking for her and she has been able to gain weight   Wt Readings from Last 3 Encounters:  09/02/17 87 lb (39.5 kg)  08/05/17 82 lb (37.2 kg)  07/07/17 84 lb (38.1 kg)   She was referred to palliative care during the last visit. She had her initial visit and Jody Moore was happy with the conversation. She is working with Palliative Care to figure out which services they can provide for her. She is doing grief counseling through Palliative Care and has found this helpful as well. She has signed her DNR and MOST form, has brought copies for me to scan into her chart. She wishes to be DNR  She was also referred to [facility] about having a home health care aid. She has not heard from [facility] yet.   Overall, she is doing well. She feels less depressed and is getting her appetite back    Review of Systems See HPI   Past Medical History:  Diagnosis Date  . Cervical cancer ([place])   . Depression   .  Essential tremor 10/04/2013  . Femur fracture ([place]) 12/22/2016  . Femur fracture, right ([place]) 12/21/2016  . Gastroesophageal reflux disease without esophagitis 07/10/2015  . 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 Osteoporosis 07/10/2015  . 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 Reflux   . Severe episode of recurrent major depressive disorder, without psychotic features ([place]) 07/10/2015  . Severe protein-calorie malnutrition ([place]) 12/29/2016  . Situational anxiety 07/29/2016  . Traumatic compression fracture of T11 thoracic vertebra, with routine healing, subsequent encounter 12/29/2016    Social History   Socioeconomic History  . Marital status: Married    Spouse name: Not on file  . Number of children: Not on file  . Years of education: Not on file  . Highest education level: Not on file  Occupational History  . Occupation: retired    Comment: ad Designer, jewellery  . Financial resource strain: Not on file  . Food insecurity:    Worry: Not on file    Inability: Not on file  . Transportation needs:    Medical: Not on file    Non-medical: Not on file  Tobacco Use  . Smoking status: Former Smoker    Packs/day: 2.00    Types: Cigarettes    Last attempt to quit: 12/2016    Years since quitting: 0.7  . Smokeless tobacco: Never Used  Substance and Sexual Activity  . Alcohol use: No    Frequency: Never  . Drug use: No  . Sexual activity: Never  Lifestyle  . Physical activity:    Days per week:  Not on file    Minutes per session: Not on file  . Stress: Not on file  Relationships  . Social connections:    Talks on phone: Not on file    Gets together: Not on file    Attends religious service: Not on file    Active member of club or organization: Not on file    Attends meetings of clubs or organizations: Not on file    Relationship status: Not on file  . Intimate partner violence:    Fear of current or ex partner: Not on file    Emotionally abused: Not on file     Physically abused: Not on file    Forced sexual activity: Not on file  Other Topics Concern  . Not on file  Social History Narrative   Diet:   Do you drink/eat things with caffeine? yes   Marital status:  Married                            What year were you married? 2015   Do you live in a house, apartment, assisted living, condo, trailer, etc)? house   Is it one or more stories? yes   How many persons live in your home? 2                                                                                               Do you have any pets in your home?  2 cats   Current or past profession: Hospital doctor   Do you exercise?    no                                                 Type & how often:   Do you have a living will?  no   Do you have a DNR Form?  no   Do you have a POA/HPOA forms?  No   Admitted to [facility] 12/25/16      DNR    Past Surgical History:  Procedure Laterality Date  . ANKLE SURGERY    . CATARACT EXTRACTION Bilateral   . CERVIX SURGERY  1980   cancer-radiation   . FEMUR SURGERY  2005   fractured right femur   . HIP SURGERY  2006   fractured right hip   . IR RADIOLOGIST EVAL & MGMT  01/14/2017  . ORIF FEMUR FRACTURE Right 12/22/2016   Procedure: OPEN REDUCTION INTERNAL FIXATION (ORIF) DISTAL FEMUR FRACTURE;  Surgeon: [name], MD;  Location: [facility];  Service: Orthopedics;  Laterality: Right;  . TONSILLECTOMY      Family History  Problem Relation Age of Onset  . Heart disease Father   . Bladder Cancer Father   . Diabetes Father     Allergies  Allergen Reactions  . Influenza Vaccines Hives    Hives/swelling/injection site war to the touch. No relief with benadryl   .  Omeprazole Hives  . Codeine Sulfate Rash    Current Outpatient Medications on File Prior to Visit  Medication Sig Dispense Refill  . acetaminophen (TYLENOL) 650 MG CR tablet Take 650 mg by mouth every 12 (twelve) hours as needed for pain.    [name] aspirin 325 MG  tablet Take 325 mg by mouth daily.    . Melatonin 3 MG TABS Take 1 tablet by mouth daily.    [name] PARoxetine (PAXIL) 40 MG tablet TAKE 1 TABLET(40 MG) BY MOUTH DAILY 90 tablet 0  . QUEtiapine (SEROQUEL) 50 MG tablet Take 1 tablet (50 mg total) by mouth at bedtime. 90 tablet 1  . topiramate (TOPAMAX) 25 MG tablet TAKE 1 TABLET BY MOUTH TWICE DAILY 180 tablet 0  . Vitamin D, Ergocalciferol, (DRISDOL) 50000 units CAPS capsule Take 1 capsule (50,000 Units total) by mouth every 7 (seven) days. 25 capsule 1   No current facility-administered medications on file prior to visit.     BP 120/72 (BP Location: Left Arm)   Temp 97.8 F (36.6 C) (Oral)   Wt 87 lb (39.5 kg)   BMI 16.96 kg/m       Objective:   Physical Exam  Constitutional: She is oriented to person, place, and time. She appears well-developed and well-nourished. No distress.  Cardiovascular: Normal rate, regular rhythm, normal heart sounds and intact distal pulses. Exam reveals no gallop and no friction rub.  No murmur heard. Pulmonary/Chest: Effort normal and breath sounds normal. No respiratory distress. She has no wheezes. She has no rales. She exhibits no tenderness.  Musculoskeletal:  Using a rolling walker   Neurological: She is alert and oriented to person, place, and time.  Skin: Skin is warm and dry. No rash noted. She is not diaphoretic. No erythema. No pallor.  Psychiatric: She has a normal mood and affect. Her behavior is normal. Judgment and thought content normal.  Nursing note and vitals reviewed.     Assessment & Plan:  1. Failure to thrive in adult - She has gained 5 pounds over the last month  - Will scan MOST and DNR into chart  - Follow up in 2-3 months or sooner if needed - Will call [facility] care   [name], NP

## 2017-09-03 NOTE — Telephone Encounter (Signed)
Jody Moore, no telephone number listed in this message.  Can you help?

## 2017-09-03 NOTE — Telephone Encounter (Signed)
Have her try her sister, Jody Moore

## 2017-09-03 NOTE — Telephone Encounter (Signed)
Spoke to [employer].  She informed me that she received an order for physical therapy.  Pt has started on palliative care.  She is unsure if pt can have both PT and palliative care at the same time.  Eustaquio Maize will try to call palliative care again on Monday.  She has tried to reach the multiple times.  This is the reason for the delay in treatment.  She wanted Tommi Rumps to know.  Will forward as FYI.

## 2017-09-03 NOTE — Telephone Encounter (Signed)
Copied from [place] [phone]. Topic: General - Other >> Sep 03, 2017  3:45 PM Vernona Rieger wrote: Eustaquio Maize said she can not get in touch with her and wants to try and sch this visit on Monday. Please call her back if needed [phone]

## 2017-09-07 ENCOUNTER — Telehealth: Payer: Self-pay | Admitting: Family Medicine

## 2017-09-07 ENCOUNTER — Encounter: Payer: Self-pay | Admitting: Adult Health

## 2017-09-07 NOTE — Telephone Encounter (Signed)
Copied from [place] [phone]. Topic: General - Other >> Sep 02, 2017  5:14 PM Cecelia Byars, NT wrote: Reason for CRM: [facility] called and would like to let the practice  know they are having trouble getting in touch with her , and she was referred to palliative care and home health ,   >> Sep 03, 2017  3:45 PM Vernona Rieger wrote: Eustaquio Maize said she can not get in touch with her and wants to try and sch this visit on Monday. Please call her back if needed [phone] >> Sep 07, 2017  8:49 AM Yvette Rack wrote: Suanne Marker from [facility] [phone] calling stating that pt would like to have PT done tomorrow 09-08-17

## 2017-09-07 NOTE — Telephone Encounter (Signed)
Duplicate message.  See message from 09/02/17.

## 2017-09-09 NOTE — Telephone Encounter (Signed)
Maye Hides PT w/[facility] [phone] would like verbal orders with a frequency of 1wk one, 2wk 6, 1wk 1, effective 09/08/17 to work on gage, balance and strengthening. She also needs a verbal order for Occupational Therapy for evaluation and treat.

## 2017-09-09 NOTE — Telephone Encounter (Signed)
Ok for verbal orders ?

## 2017-09-10 NOTE — Telephone Encounter (Signed)
Left a message on identified voicemail to proceed with orders.  Call back if needed.

## 2017-09-17 ENCOUNTER — Telehealth: Payer: Self-pay | Admitting: Adult Health

## 2017-09-17 ENCOUNTER — Other Ambulatory Visit: Payer: Self-pay | Admitting: Adult Health

## 2017-09-17 ENCOUNTER — Other Ambulatory Visit: Payer: Self-pay | Admitting: Internal Medicine

## 2017-09-17 DIAGNOSIS — F332 Major depressive disorder, recurrent severe without psychotic features: Secondary | ICD-10-CM

## 2017-09-17 MED ORDER — PAROXETINE HCL 40 MG PO TABS: ORAL_TABLET | ORAL | 1 refills | Status: DC

## 2017-09-17 NOTE — Telephone Encounter (Signed)
Sharyn Lull calling back and stated she forgot to add in verbal orders to request [place] work for [employer] and psychosocial needs for the patient. Please advise.

## 2017-09-17 NOTE — Telephone Encounter (Signed)
Ok for all verbal orders

## 2017-09-17 NOTE — Telephone Encounter (Signed)
Ok for verbal orders ?

## 2017-09-17 NOTE — Telephone Encounter (Signed)
Sharyn Lull notified to proceed with all orders.  No further action required.

## 2017-09-17 NOTE — Telephone Encounter (Signed)
Copied from [place] [phone]. Topic: Quick Communication - See Telephone Encounter >> Sep 17, 2017  1:35 PM Boyd Kerbs wrote: CRM for notification.   Celesta Gentile [phone] did evaluation today and asking for verbal orders for OT 1 x 1; 2 x 3  See Telephone encounter for: 09/17/17.

## 2017-09-20 ENCOUNTER — Telehealth: Payer: Self-pay | Admitting: Adult Health

## 2017-09-20 NOTE — Telephone Encounter (Signed)
Copied from [place] [phone]. Topic: Quick Communication - See Telephone Encounter >> Sep 20, 2017  1:26 PM Boyd Kerbs wrote: CRM for notification.   [place] [phone] Pt. Has family emergency and wants Home care to be put on hold and added to the end of episodes. Verbal order needed   See Telephone encounter for: 09/20/17.

## 2017-09-21 NOTE — Telephone Encounter (Signed)
Ok for verbal orders ?

## 2017-09-21 NOTE — Telephone Encounter (Signed)
Spoke to [facility] and gave permission to hold home health.  Liji stated she hopes to re start next week.  No further action needed.  Will close note.

## 2017-09-23 ENCOUNTER — Other Ambulatory Visit: Payer: Self-pay | Admitting: Adult Health

## 2017-09-23 DIAGNOSIS — G250 Essential tremor: Secondary | ICD-10-CM

## 2017-09-23 NOTE — Telephone Encounter (Signed)
Sent to the pharmacy by e-scribe. 

## 2017-09-30 ENCOUNTER — Ambulatory Visit: Payer: Medicare Other | Admitting: Adult Health

## 2017-09-30 ENCOUNTER — Encounter: Payer: Self-pay | Admitting: Adult Health

## 2017-09-30 VITALS — BP 134/66 | Temp 97.7°F | Wt 86.0 lb

## 2017-09-30 DIAGNOSIS — Z789 Other specified health status: Secondary | ICD-10-CM

## 2017-09-30 DIAGNOSIS — Z7409 Other reduced mobility: Secondary | ICD-10-CM | POA: Diagnosis not present

## 2017-09-30 NOTE — Progress Notes (Signed)
Subjective:    Patient ID: Jody Moore, female    DOB: 18-Apr-1941, 77 y.o.   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  She presents with her sister and brother in law to this visit. They are having trouble getting a home health aide. They are confused on the process. They would like to have someone come in and help Kiyra around the house ( shower, cook meals, do laundry). She is currently doing PT/OT through [facility] See HPI   Past Medical History:  Diagnosis Date  . Cervical cancer ([place])   . Depression   . Essential tremor 10/04/2013  . Femur fracture ([place]) 12/22/2016  . Femur fracture, right ([place]) 12/21/2016  . Gastroesophageal reflux disease without esophagitis 07/10/2015  . 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 Osteoporosis 07/10/2015  . 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 Reflux   . Severe episode of recurrent major depressive disorder, without psychotic features ([place]) 07/10/2015  . Severe protein-calorie malnutrition ([place]) 12/29/2016  . Situational anxiety 07/29/2016  . Traumatic compression fracture of T11 thoracic vertebra, with routine healing, subsequent encounter 12/29/2016    Social History   Socioeconomic History  . Marital status: Married    Spouse  name: Not on file  . Number of children: Not on file  . Years of education: Not on file  . Highest education level: Not on file  Occupational History  . Occupation: retired    Comment: ad Designer, jewellery  . Financial resource strain: Not on file  . Food insecurity:    Worry: Not on file    Inability: Not on file  . Transportation needs:    Medical: Not on file    Non-medical: Not on file  Tobacco Use  . Smoking status: Former Smoker    Packs/day: 2.00    Types: Cigarettes    Last attempt to quit: 12/2016    Years since quitting: 0.8  . Smokeless tobacco: Never Used  Substance and Sexual Activity  . Alcohol use: No    Frequency: Never  . Drug use: No  . Sexual activity: Never  Lifestyle  . Physical activity:    Days per week: Not on file    Minutes per session: Not on file  . Stress: Not on file  Relationships  . Social connections:    Talks on phone: Not on file    Gets together: Not on file    Attends religious service: Not on file    Active member of club or organization: Not on file    Attends meetings of clubs or organizations: Not on file    Relationship status: Not on file  . Intimate partner violence:    Fear of current or ex partner: Not on file    Emotionally abused: Not on file    Physically abused: Not on file    Forced sexual activity: Not on file  Other Topics Concern  . Not on file  Social  History Narrative   Diet:   Do you drink/eat things with caffeine? yes   Marital status:  Married                            What year were you married? 2015   Do you live in a house, apartment, assisted living, condo, trailer, etc)? house   Is it one or more stories? yes   How many persons live in your home? 2                                                                                               Do you have any pets in your home?  2 cats   Current or past profession: Hospital doctor   Do you exercise?    no                                                  Type & how often:   Do you have a living will?  no   Do you have a DNR Form?  no   Do you have a POA/HPOA forms?  No   Admitted to [facility] 12/25/16      DNR    Past Surgical History:  Procedure Laterality Date  . ANKLE SURGERY    . CATARACT EXTRACTION Bilateral   . CERVIX SURGERY  1980   cancer-radiation   . FEMUR SURGERY  2005   fractured right femur   . HIP SURGERY  2006   fractured right hip   . IR RADIOLOGIST EVAL & MGMT  01/14/2017  . ORIF FEMUR FRACTURE Right 12/22/2016   Procedure: OPEN REDUCTION INTERNAL FIXATION (ORIF) DISTAL FEMUR FRACTURE;  Surgeon: [name], MD;  Location: [facility];  Service: Orthopedics;  Laterality: Right;  . TONSILLECTOMY      Family History  Problem Relation Age of Onset  . Heart disease Father   . Bladder Cancer Father   . Diabetes Father     Allergies  Allergen Reactions  . Influenza Vaccines Hives    Hives/swelling/injection site war to the touch. No relief with benadryl   . Omeprazole Hives  . Codeine Sulfate Rash    Current Outpatient Medications on File Prior to Visit  Medication Sig Dispense Refill  . Melatonin 3 MG TABS Take 1 tablet by mouth daily.    [name] PARoxetine (PAXIL) 40 MG tablet TAKE 1 TABLET(40 MG) BY MOUTH DAILY 90 tablet 1  . QUEtiapine (SEROQUEL) 50 MG tablet Take 1 tablet (50 mg total) by mouth at bedtime. 90 tablet 1  . topiramate (TOPAMAX) 25 MG tablet TAKE 1 TABLET BY MOUTH TWICE DAILY 180 tablet 2  . Vitamin D, Ergocalciferol, (DRISDOL) 50000 units CAPS capsule Take 1 capsule (50,000 Units total) by mouth every 7 (seven) days. 25 capsule 1   No current facility-administered medications on file prior to visit.     BP  134/66   Temp 97.7 F (36.5 C) (Oral)   Wt 86 lb (39 kg)   BMI 16.77 kg/m       Objective:   Physical Exam  Constitutional: She is oriented to person, place, and time. She appears well-developed and well-nourished. She appears cachectic. No distress.    Cardiovascular: Normal rate, regular rhythm, normal heart sounds and intact distal pulses. Exam reveals no gallop and no friction rub.  No murmur heard. Pulmonary/Chest: Effort normal and breath sounds normal. No respiratory distress. She has no wheezes. She has no rales. She exhibits no tenderness.  Neurological: She is alert and oriented to person, place, and time.  Skin: Skin is warm and dry. No rash noted. No erythema. No pallor.  Psychiatric: She has a normal mood and affect. Her behavior is normal. Judgment and thought content normal.  Nursing note and vitals reviewed.     Assessment & Plan:  1. Impaired mobility and ADLs - Advised to call insurance company and they should have some local companies they they will contract to for this issue. We do not write orders for this service. Also they can look at paying out of pocket.   [name], NP

## 2017-10-04 ENCOUNTER — Telehealth: Payer: Self-pay | Admitting: Family Medicine

## 2017-10-04 NOTE — Telephone Encounter (Signed)
Copied from [place] [id number]. Topic: Quick Communication - See Telephone Encounter >> Sep 20, 2017  1:26 PM Boyd Kerbs wrote: CRM for notification.   [place] [phone] Pt. Has family emergency and wants Home care to be put on hold and added to the end of episodes. Verbal order needed   See Telephone encounter for: 09/20/17. >> Oct 04, 2017  9:27 AM Oneta Rack wrote: [place] Human name: Rob  Relation to pt: PT from [facility]  Call back number: [phone]    Reason for call:  Patient missed to 2 PT visit.

## 2017-10-04 NOTE — Telephone Encounter (Signed)
Copied from [place] [id number]. Topic: Quick Communication - See Telephone Encounter >> Oct 04, 2017  9:27 AM Oneta Rack wrote: Caller name: Rob  Relation to pt: PT from [facility]  Call back number: [phone]    Reason for call:  Patient missed to 2 PT visit.

## 2017-10-05 NOTE — Telephone Encounter (Signed)
Spoke to [place] and gave authorization to place home care of hold and add to end. Nothing further needed.  Will close note.

## 2017-10-06 ENCOUNTER — Telehealth: Payer: Self-pay | Admitting: Adult Health

## 2017-10-06 NOTE — Telephone Encounter (Signed)
Copied from [place] [phone]. Topic: Quick Communication - See Telephone Encounter >> Oct 06, 2017 10:38 AM Cleaster Corin, NT wrote: CRM for notification. See Telephone encounter for: 10/06/17.  Sharyn Lull from [facility] requesting verbal orders for OT  for pt. To be put on hold this week due to pt. Refusing care and would like to start back next weekn 10-11-17. 2 week 3. Sharyn Lull can be reached at [phone]

## 2017-10-06 NOTE — Telephone Encounter (Signed)
Copied from [place] [id number]. Topic: Quick Communication - See Telephone Encounter >> Oct 06, 2017  1:16 PM Hewitt Shorts wrote: Jody Moore from [facility] is calling to let the provider know  that pt declined appt today due to moviing the next appt is going to be week of the 13th due to Jody Moore  going on vacation  The next week   Best number [phone]

## 2017-10-06 NOTE — Telephone Encounter (Signed)
Spoke to [place] and informed her that it is ok to hold visits until week of 10/11/17.

## 2017-10-18 ENCOUNTER — Telehealth: Payer: Self-pay | Admitting: Adult Health

## 2017-10-18 NOTE — Telephone Encounter (Signed)
Copied from [place] [phone]. Topic: Quick Communication - See Telephone Encounter >> Oct 18, 2017  1:10 PM Selinda Flavin B, NT wrote: CRM for notification. See Telephone encounter for: 10/18/17. Eliezer Lofts, Medical sales representative with [facility], is request orders for a bedside commode and tub transfer bench. Patient is aware that the tub transfer bench is not covered by insurance and is going to be paid out of pocket. CB# for [facility]: [phone] (ask for [facility])  Orders faxed to the following: [facility] Fax#: [phone]

## 2017-10-19 ENCOUNTER — Encounter: Payer: Self-pay | Admitting: Family Medicine

## 2017-10-19 NOTE — Telephone Encounter (Signed)
Order for bedside commode and transfer transfer bench sent by fax.  Received confirmation the transmission was successful.  No further action required.

## 2017-11-03 ENCOUNTER — Telehealth: Payer: Self-pay | Admitting: Adult Health

## 2017-11-03 NOTE — Telephone Encounter (Signed)
Copied from [place] [phone]. Topic: General - Other >> Nov 03, 2017  2:12 PM Lennox Solders wrote: Reason for CRM: [facility] PT  is calling from [facility] to report the patient is doing good and will be discharged from physical therapy today

## 2017-11-04 NOTE — Telephone Encounter (Signed)
Noted  

## 2017-11-17 ENCOUNTER — Telehealth: Payer: Self-pay

## 2017-11-17 NOTE — Telephone Encounter (Signed)
Phone call placed to patient to schedule a follow up visit with Palliative Care. Scheduled for 11/18/17.

## 2017-11-18 ENCOUNTER — Other Ambulatory Visit: Payer: Medicare Other | Admitting: Internal Medicine

## 2017-11-26 ENCOUNTER — Ambulatory Visit: Payer: Medicare Other | Admitting: Adult Health

## 2017-11-26 ENCOUNTER — Encounter: Payer: Self-pay | Admitting: Adult Health

## 2017-11-26 VITALS — BP 132/62 | Temp 97.3°F | Wt 84.0 lb

## 2017-11-26 DIAGNOSIS — R627 Adult failure to thrive: Secondary | ICD-10-CM

## 2017-11-26 NOTE — Progress Notes (Addendum)
Subjective:    Patient ID: Jody Moore, female    DOB: May 15, 1941, 77 y.o.   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  She presents to the office today for follow up dealing with failure to thrive. Her sister is with her at this visit.  They both reports that Jody Moore is doing as well as she can. PT/OT has completed their visits and Jody Moore found benefit form this. Palliative Care is coming into the home and their next visit will be in a few weeks.   Jody Moore reports that her diet is " some days good and some days not so good". She often looses track of time and forgets to eat. Her sister is making her meals and bringing them over to the house.   She is sleeping well and feels as though her depression has improved.   She denies any CP, SOB, or falls at home   Has no acute complaints.   Review of Systems See HPI   Past Medical History:  Diagnosis Date  . Cervical cancer ([place])   . Depression   . Essential tremor 10/04/2013  . Femur fracture ([place]) 12/22/2016  . Femur fracture, right ([place]) 12/21/2016  . Gastroesophageal reflux disease without esophagitis 07/10/2015  . 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 Osteoporosis 07/10/2015  . 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 Reflux   . Severe episode of recurrent  major depressive disorder, without psychotic features ([place]) 07/10/2015  . Severe protein-calorie malnutrition ([place]) 12/29/2016  . Situational anxiety 07/29/2016  . Traumatic compression fracture of T11 thoracic vertebra, with routine healing, subsequent encounter 12/29/2016    Social History   Socioeconomic History  . Marital status: Married    Spouse name: Not on file  . Number of children: Not on file  . Years of education: Not on file  . Highest education level: Not on file  Occupational History  . Occupation: retired    Comment: ad Designer, jewellery  . Financial resource strain: Not on file  . Food insecurity:    Worry: Not on file    Inability: Not on file  . Transportation needs:    Medical: Not on file    Non-medical: Not on file  Tobacco Use  . Smoking status: Former Smoker    Packs/day: 2.00    Types: Cigarettes    Last attempt to quit: 12/2016    Years since quitting: 0.9  . Smokeless tobacco: Never Used  Substance and Sexual Activity  . Alcohol use: No    Frequency: Never  . Drug use: No  . Sexual activity: Never  Lifestyle  . Physical activity:    Days per week: Not on file    Minutes per session: Not on file  . Stress: Not on file  Relationships  . Social connections:    Talks on phone: Not on file    Gets together: Not on file    Attends religious service: Not on file    Active member of  club or organization: Not on file    Attends meetings of clubs or organizations: Not on file    Relationship status: Not on file  . Intimate partner violence:    Fear of current or ex partner: Not on file    Emotionally abused: Not on file    Physically abused: Not on file    Forced sexual activity: Not on file  Other Topics Concern  . Not on file  Social History Narrative   Diet:   Do you drink/eat things with caffeine? yes   Marital status:  Married                            What year were you married? 2015   Do you live in a house, apartment, assisted  living, condo, trailer, etc)? house   Is it one or more stories? yes   How many persons live in your home? 2                                                                                               Do you have any pets in your home?  2 cats   Current or past profession: Hospital doctor   Do you exercise?    no                                                 Type & how often:   Do you have a living will?  no   Do you have a DNR Form?  no   Do you have a POA/HPOA forms?  No   Admitted to [facility] 12/25/16      DNR    Past Surgical History:  Procedure Laterality Date  . ANKLE SURGERY    . CATARACT EXTRACTION Bilateral   . CERVIX SURGERY  1980   cancer-radiation   . FEMUR SURGERY  2005   fractured right femur   . HIP SURGERY  2006   fractured right hip   . IR RADIOLOGIST EVAL & MGMT  01/14/2017  . ORIF FEMUR FRACTURE Right 12/22/2016   Procedure: OPEN REDUCTION INTERNAL FIXATION (ORIF) DISTAL FEMUR FRACTURE;  Surgeon: [name], MD;  Location: [facility];  Service: Orthopedics;  Laterality: Right;  . TONSILLECTOMY      Family History  Problem Relation Age of Onset  . Heart disease Father   . Bladder Cancer Father   . Diabetes Father     Allergies  Allergen Reactions  . Influenza Vaccines Hives    Hives/swelling/injection site war to the touch. No relief with benadryl   . Omeprazole Hives  . Codeine Sulfate Rash    Current Outpatient Medications on File Prior to Visit  Medication Sig Dispense Refill  . Melatonin 3 MG TABS Take 1 tablet by mouth daily.    [name] PARoxetine (PAXIL) 40 MG tablet TAKE 1 TABLET(40 MG) BY MOUTH DAILY 90 tablet  1  . QUEtiapine (SEROQUEL) 50 MG tablet Take 1 tablet (50 mg total) by mouth at bedtime. 90 tablet 1  . topiramate (TOPAMAX) 25 MG tablet TAKE 1 TABLET BY MOUTH TWICE DAILY 180 tablet 2  . Vitamin D, Ergocalciferol, (DRISDOL) 50000 units CAPS capsule Take 1 capsule (50,000 Units total) by mouth every 7 (seven) days. 25  capsule 1   No current facility-administered medications on file prior to visit.     BP 132/62   Temp (!) 97.3 F (36.3 C) (Oral)   Wt 84 lb (38.1 kg)   BMI 16.38 kg/m       Objective:   Physical Exam  Constitutional: She appears well-developed. She appears cachectic. No distress.  Eyes: Pupils are equal, round, and reactive to light. EOM are normal.  Cardiovascular: Normal rate, regular rhythm, normal heart sounds and intact distal pulses. Exam reveals no gallop and no friction rub.  No murmur heard. Pulmonary/Chest: Effort normal and breath sounds normal. No stridor. No respiratory distress. She has no wheezes. She has no rales. She exhibits no tenderness.  Musculoskeletal:  Walks with a single prong cane and with the help of her sister. Has slow gait   Neurological: She is alert.  Skin: Skin is warm and dry. She is not diaphoretic.  Nursing note and vitals reviewed.     Assessment & Plan:  1. Failure to thrive in adult - We tried in the past to prescribe her marinol but insurance would not approve. I am ok with trying a low dose of Remeron. Patient refuses at this time. Advised her to try and get on a schedule of eating and can snack during the day. She does not have dietary restrictions. She can add Ensure or other dietary supplement.  - Continue with Palliative Care and will await updates from them  - Follow up in 6 months or sooner if needed  [name], NP

## 2017-11-30 ENCOUNTER — Ambulatory Visit: Payer: Medicare Other | Admitting: Adult Health

## 2017-12-21 ENCOUNTER — Other Ambulatory Visit: Payer: Medicare Other | Admitting: Internal Medicine

## 2017-12-30 ENCOUNTER — Other Ambulatory Visit: Payer: Self-pay | Admitting: Adult Health

## 2017-12-30 DIAGNOSIS — Z760 Encounter for issue of repeat prescription: Secondary | ICD-10-CM

## 2017-12-30 DIAGNOSIS — F329 Major depressive disorder, single episode, unspecified: Secondary | ICD-10-CM

## 2017-12-30 DIAGNOSIS — F32A Depression, unspecified: Secondary | ICD-10-CM

## 2017-12-30 DIAGNOSIS — R441 Visual hallucinations: Secondary | ICD-10-CM

## 2017-12-30 NOTE — Telephone Encounter (Signed)
Sent to the pharmacy by e-scribe. 

## 2018-01-26 ENCOUNTER — Encounter: Payer: Self-pay | Admitting: Adult Health

## 2018-01-26 NOTE — Telephone Encounter (Signed)
A couple of weeks ago, my mother, Jody Moore, had an episode of sudden, unexpected diarrhea, and she wanted to get in touch with you because she was concerned. I called the office, was told you were out and that a nurse would call me that day. I never received a call. I sent the same message through my mother's MyChart, which indicated on the screen that it would go to your office, but I never received a reply. I called back and made her an appointment for the next day, but then I got a call back and was told that the appointment would have to be put off until the day after the next day. Well, my mother wouldn't wait that long. She told me to cancel it, which I did.    I am telling you this to ask you what the best method of communicating with you is if we have another emergency. My mother wants to be seen or reassured right away if anything is wrong, and I need to try to help her do that. Would you let me know the best way to get help for her? Thanks.    Loran Senters   This is the second message from the daughter.  Cory please advise.  Thanks

## 2018-01-26 NOTE — Telephone Encounter (Signed)
Hi, Kimisha, Eunice has had a call from [facility] about having a follow-up colonoscopy. She had a colonoscopy five or so years ago and had some polyps. Assuming that she would want to do so, do you think that it would be safe for her to undergo anesthesia again? She seems to be close to conquering her hallucinations, and I don't want anything to put her on that path again.    My understanding is that there are less invasive methods to determine the state of your colon. I would appreciate your input on this.    On another matter, I cannot contact you through MyChart in my name because you have fallen off my contact list, whether by design or accident, I do not know.    However, I have been able to add my mother, Emberli Ballester, to my MyChart by becoming the proxy for her account.     This will be continued in another message immediately.    Thank you.    Renee Pain please advise. Thanks

## 2018-02-01 ENCOUNTER — Encounter: Payer: Self-pay | Admitting: Adult Health

## 2018-02-11 ENCOUNTER — Encounter: Payer: Self-pay | Admitting: Adult Health

## 2018-03-09 ENCOUNTER — Other Ambulatory Visit: Payer: Self-pay | Admitting: Adult Health

## 2018-03-09 DIAGNOSIS — F332 Major depressive disorder, recurrent severe without psychotic features: Secondary | ICD-10-CM

## 2018-03-15 ENCOUNTER — Encounter: Payer: Self-pay | Admitting: Adult Health

## 2018-03-15 ENCOUNTER — Ambulatory Visit: Payer: Medicare Other | Admitting: Adult Health

## 2018-03-15 VITALS — BP 118/64 | HR 65 | Temp 97.5°F | Wt 85.6 lb

## 2018-03-15 DIAGNOSIS — E43 Unspecified severe protein-calorie malnutrition: Secondary | ICD-10-CM

## 2018-03-15 NOTE — Progress Notes (Signed)
Subjective:    Patient ID: Jody Moore, female    DOB: Mar 27, 1941, 77 y.o.   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  She presents to the office today for follow up regarding weight loss. She reports that her appetite has been better, she is eating two big meals a day and is snacking. Her sister brings her food on a daily basis. She denies any nausea, vomiting, or diarrhea.   Overall, she states that she feels " really good"   Wt Readings from Last 3 Encounters:  03/15/18 85 lb 9.6 oz (38.8 kg)  11/26/17 84 lb (38.1 kg)  09/30/17 86 lb (39 kg)    Review of Systems See HPI   Past Medical History:  Diagnosis Date  . Cervical cancer ([place])   . Depression   . Essential tremor 10/04/2013  . Femur fracture ([place]) 12/22/2016  . Femur fracture, right ([place]) 12/21/2016  . Gastroesophageal reflux disease without esophagitis 07/10/2015  . 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 Osteoporosis 07/10/2015  . 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 Reflux   . Severe episode of recurrent major depressive disorder, without psychotic features ([place]) 07/10/2015  . Severe protein-calorie malnutrition ([place]) 12/29/2016  . Situational anxiety 07/29/2016  . Traumatic compression fracture of T11 thoracic vertebra, with routine  healing, subsequent encounter 12/29/2016    Social History   Socioeconomic History  . Marital status: Married    Spouse name: Not on file  . Number of children: Not on file  . Years of education: Not on file  . Highest education level: Not on file  Occupational History  . Occupation: retired    Comment: ad Designer, jewellery  . Financial resource strain: Not on file  . Food insecurity:    Worry: Not on file    Inability: Not on file  . Transportation needs:    Medical: Not on file    Non-medical: Not on file  Tobacco Use  . Smoking status: Former Smoker    Packs/day: 2.00    Types: Cigarettes    Last attempt to quit: 12/2016    Years since quitting: 1.2  . Smokeless tobacco: Never Used  Substance and Sexual Activity  . Alcohol use: No    Frequency: Never  . Drug use: No  . Sexual activity: Never  Lifestyle  . Physical activity:    Days per week: Not on file    Minutes per session: Not on file  . Stress: Not on file  Relationships  . Social connections:    Talks on phone: Not on file    Gets together: Not on file    Attends religious service: Not on file    Active member of club or organization: Not on file    Attends meetings of clubs or organizations: Not on file    Relationship status: Not on file  . Intimate partner violence:    Fear of current or ex partner: Not on file    Emotionally abused: Not  on file    Physically abused: Not on file    Forced sexual activity: Not on file  Other Topics Concern  . Not on file  Social History Narrative   Diet:   Do you drink/eat things with caffeine? yes   Marital status:  Married                            What year were you married? 2015   Do you live in a house, apartment, assisted living, condo, trailer, etc)? house   Is it one or more stories? yes   How many persons live in your home? 2                                                                                               Do you have any pets in your  home?  2 cats   Current or past profession: Hospital doctor   Do you exercise?    no                                                 Type & how often:   Do you have a living will?  no   Do you have a DNR Form?  no   Do you have a POA/HPOA forms?  No   Admitted to [facility] 12/25/16      DNR    Past Surgical History:  Procedure Laterality Date  . ANKLE SURGERY    . CATARACT EXTRACTION Bilateral   . CERVIX SURGERY  1980   cancer-radiation   . FEMUR SURGERY  2005   fractured right femur   . HIP SURGERY  2006   fractured right hip   . IR RADIOLOGIST EVAL & MGMT  01/14/2017  . ORIF FEMUR FRACTURE Right 12/22/2016   Procedure: OPEN REDUCTION INTERNAL FIXATION (ORIF) DISTAL FEMUR FRACTURE;  Surgeon: [name], MD;  Location: [facility];  Service: Orthopedics;  Laterality: Right;  . TONSILLECTOMY      Family History  Problem Relation Age of Onset  . Heart disease Father   . Bladder Cancer Father   . Diabetes Father     Allergies  Allergen Reactions  . Influenza Vaccines Hives    Hives/swelling/injection site war to the touch. No relief with benadryl   . Omeprazole Hives  . Codeine Sulfate Rash    Current Outpatient Medications on File Prior to Visit  Medication Sig Dispense Refill  . ibuprofen (ADVIL,MOTRIN) 200 MG tablet Take 200 mg by mouth every 6 (six) hours as needed.    . Melatonin 3 MG TABS Take 1 tablet by mouth daily.    . Multiple Vitamins-Minerals (MULTIVITAMIN WITH MINERALS) tablet Take 1 tablet by mouth daily.    [name] PARoxetine (PAXIL) 40 MG tablet TAKE 1 TABLET(40 MG) BY MOUTH DAILY 90 tablet 0  . QUEtiapine (SEROQUEL) 50 MG tablet TAKE 1 TABLET(50 MG) BY  MOUTH AT BEDTIME 90 tablet 1  . topiramate (TOPAMAX) 25 MG tablet TAKE 1 TABLET BY MOUTH TWICE DAILY 180 tablet 2  . Vitamin D, Ergocalciferol, (DRISDOL) 50000 units CAPS capsule TAKE ONE CAPSULE BY MOUTH EVERY 7 DAYS  1   No current facility-administered medications on file prior to visit.      BP 118/64 (BP Location: Left Arm, Patient Position: Sitting, Cuff Size: Normal)   Pulse 65   Temp (!) 97.5 F (36.4 C) (Oral)   Wt 85 lb 9.6 oz (38.8 kg)   SpO2 96%   BMI 16.69 kg/m       Objective:   Physical Exam  Constitutional: She is oriented to person, place, and time. She appears well-developed and well-nourished. No distress.  Cardiovascular: Normal rate, regular rhythm, normal heart sounds and intact distal pulses.  Pulmonary/Chest: Effort normal and breath sounds normal.  Musculoskeletal:  Walks with a walker   Neurological: She is alert and oriented to person, place, and time.  Skin: Skin is warm and dry. She is not diaphoretic.  Nursing note and vitals reviewed.     Assessment & Plan:  1. Severe protein-calorie malnutrition ([place]) - Weight gain of 1.5 pounds since June  - Encouraged to continue to eat high protein meals and snacks.  - Follow up as needed  [name], AGNP

## 2018-03-16 ENCOUNTER — Other Ambulatory Visit: Payer: Medicare Other | Admitting: Internal Medicine

## 2018-05-27 ENCOUNTER — Ambulatory Visit: Payer: Medicare Other | Admitting: Adult Health

## 2018-06-04 ENCOUNTER — Other Ambulatory Visit: Payer: Self-pay | Admitting: Adult Health

## 2018-06-04 DIAGNOSIS — F332 Major depressive disorder, recurrent severe without psychotic features: Secondary | ICD-10-CM

## 2018-06-07 NOTE — Telephone Encounter (Signed)
Sent to the pharmacy by e-scribe.  Pt is scheduled for follow up on 06/16/18.  Nothing further needed at this time.

## 2018-06-07 NOTE — Telephone Encounter (Signed)
Left a message for Sunday Spillers to call back.  Pt now due for follow up.

## 2018-06-09 ENCOUNTER — Encounter: Payer: Medicare Other | Admitting: Adult Health

## 2018-06-11 ENCOUNTER — Other Ambulatory Visit: Payer: Self-pay | Admitting: Adult Health

## 2018-06-11 DIAGNOSIS — G250 Essential tremor: Secondary | ICD-10-CM

## 2018-06-16 ENCOUNTER — Ambulatory Visit: Payer: Medicare Other | Admitting: Adult Health

## 2018-06-17 ENCOUNTER — Encounter: Payer: Self-pay | Admitting: Adult Health

## 2018-06-21 ENCOUNTER — Other Ambulatory Visit: Payer: Self-pay | Admitting: Adult Health

## 2018-06-21 DIAGNOSIS — R441 Visual hallucinations: Secondary | ICD-10-CM

## 2018-06-21 DIAGNOSIS — F329 Major depressive disorder, single episode, unspecified: Secondary | ICD-10-CM

## 2018-06-21 DIAGNOSIS — Z760 Encounter for issue of repeat prescription: Secondary | ICD-10-CM

## 2018-06-21 DIAGNOSIS — F32A Depression, unspecified: Secondary | ICD-10-CM

## 2018-06-22 NOTE — Telephone Encounter (Signed)
Sent to the pharmacy by e-scribe as instructed. 

## 2018-06-22 NOTE — Telephone Encounter (Signed)
Ok to fill for 90 +1

## 2018-06-22 NOTE — Telephone Encounter (Signed)
Pt cancelled cpx.  Please advise.

## 2018-07-06 ENCOUNTER — Encounter: Payer: Medicare Other | Admitting: Adult Health

## 2018-07-12 ENCOUNTER — Other Ambulatory Visit: Payer: Self-pay | Admitting: Adult Health

## 2018-07-13 NOTE — Telephone Encounter (Signed)
Jody Moore, I don't see recent lab work. Please advise.

## 2018-07-20 ENCOUNTER — Ambulatory Visit (INDEPENDENT_AMBULATORY_CARE_PROVIDER_SITE_OTHER): Payer: Medicare Other

## 2018-07-20 ENCOUNTER — Telehealth: Payer: Self-pay

## 2018-07-20 VITALS — Wt 83.0 lb

## 2018-07-20 DIAGNOSIS — Z0000 Encounter for general adult medical examination without abnormal findings: Secondary | ICD-10-CM

## 2018-07-20 DIAGNOSIS — Z1211 Encounter for screening for malignant neoplasm of colon: Secondary | ICD-10-CM

## 2018-07-20 DIAGNOSIS — Z13820 Encounter for screening for osteoporosis: Secondary | ICD-10-CM

## 2018-07-20 NOTE — Patient Instructions (Addendum)
Colonoscopy and bone density scan orders placed. You should receive a call within the next week or so to schedule both.  Continue doing brain stimulating activities (puzzles, reading, adult coloring books, staying active) to keep memory sharp.   Continue staying hydrated, replacing electrolytes while you are having diarrhea. Consider gatorade. Will notify [facility].   Continue chair/weight bearing exercises to prevent fractures/injuries.  Refer to cholesterol lowering diet information provided.   Jody Moore , Thank you for taking time to come for your Medicare Wellness Visit. I appreciate your ongoing commitment to your health goals. Please review the following plan we discussed and let me know if I can assist you in the future.   These are the goals we discussed: Goals    . Patient Stated     Cut down on vaping to see if diarrhea improves; keep mentally and physically active.  No falls!!       This is a list of the screening recommended for you and due dates:  Health Maintenance  Topic Date Due  . Pneumonia vaccines (2 of 2 - PCV13) 07/21/2019*  . Tetanus Vaccine  12/22/2026  . DEXA scan (bone density measurement)  Completed  *Topic was postponed. The date shown is not the original due date.     Exercises To Do While Sitting  Exercises that you do while sitting (chair exercises) can give you many of the same benefits as full exercise. Benefits include strengthening your heart, burning calories, and keeping muscles and joints healthy. Exercise can also improve your mood and help with depression and anxiety. You may benefit from chair exercises if you are unable to do standing exercises because of:  Diabetic foot pain.  Obesity.  Illness.  Arthritis.  Recovery from surgery or injury.  Breathing problems.  Balance problems.  Another type of disability. Before starting chair exercises, check with your health care provider or a physical therapist to find out how much  exercise you can tolerate and which exercises are safe for you. If your health care provider approves:  Start out slowly and build up over time. Aim to work up to about 10-20 minutes for each exercise session.  Make exercise part of your daily routine.  Drink water when you exercise. Do not wait until you are thirsty. Drink every 10-15 minutes.  Stop exercising right away if you have pain, nausea, shortness of breath, or dizziness.  If you are exercising in a wheelchair, make sure to lock the wheels.  Ask your health care provider whether you can do tai chi or yoga. Many positions in these mind-body exercises can be modified to do while seated. Warm-up Before starting other exercises: 1. Sit up as straight as you can. Have your knees bent at 90 degrees, which is the shape of the capital letter "L." Keep your feet flat on the floor. 2. Sit at the front edge of your chair, if you can. 3. Pull in (tighten) the muscles in your abdomen and stretch your spine and neck as straight as you can. Hold this position for a few minutes. 4. Breathe in and out evenly. Try to concentrate on your breathing, and relax your mind. Stretching Exercise A: Arm stretch 1. Hold your arms out straight in front of your body. 2. Bend your hands at the wrist with your fingers pointing up, as if signaling someone to stop. Notice the slight tension in your forearms as you hold the position. 3. Keeping your arms out and your hands bent, rotate  your hands outward as far as you can and hold this stretch. Aim to have your thumbs pointing up and your pinkie fingers pointing down. Slowly repeat arm stretches for one minute as tolerated. Exercise B: Leg stretch 1. If you can move your legs, try to "draw" letters on the floor with the toes of your foot. Write your name with one foot. 2. Write your name with the toes of your other foot. Slowly repeat the movements for one minute as tolerated. Exercise C: Reach for the  sky 1. Reach your hands as far over your head as you can to stretch your spine. 2. Move your hands and arms as if you are climbing a rope. Slowly repeat the movements for one minute as tolerated. Range of motion exercises Exercise A: Shoulder roll 1. Let your arms hang loosely at your sides. 2. Lift just your shoulders up toward your ears, then let them relax back down. 3. When your shoulders feel loose, rotate your shoulders in backward and forward circles. Do shoulder rolls slowly for one minute as tolerated. Exercise B: March in place 1. As if you are marching, pump your arms and lift your legs up and down. Lift your knees as high as you can. ? If you are unable to lift your knees, just pump your arms and move your ankles and feet up and down. March in place for one minute as tolerated. Exercise C: Seated jumping jacks 1. Let your arms hang down straight. 2. Keeping your arms straight, lift them up over your head. Aim to point your fingers to the ceiling. 3. While you lift your arms, straighten your legs and slide your heels along the floor to your sides, as wide as you can. 4. As you bring your arms back down to your sides, slide your legs back together. ? If you are unable to use your legs, just move your arms. Slowly repeat seated jumping jacks for one minute as tolerated. Strengthening exercises Exercise A: Shoulder squeeze 1. Hold your arms straight out from your body to your sides, with your elbows bent and your fists pointed at the ceiling. 2. Keeping your arms in the bent position, move them forward so your elbows and forearms meet in front of your face. 3. Open your arms back out as wide as you can with your elbows still bent, until you feel your shoulder blades squeezing together. Hold for 5 seconds. Slowly repeat the movements forward and backward for one minute as tolerated. Contact a health care provider if you:  Had to stop exercising due to any of the  following: ? Pain. ? Nausea. ? Shortness of breath. ? Dizziness. ? Fatigue.  Have significant pain or soreness after exercising. Get help right away if you have:  Chest pain.  Difficulty breathing. These symptoms may represent a serious problem that is an emergency. Do not wait to see if the symptoms will go away. Get medical help right away. Call your local emergency services (911 in the [place] Do not drive yourself to the hospital. This information is not intended to replace advice given to you by your health care provider. Make sure you discuss any questions you have with your health care provider. Document Released: 04/07/2017 Document Revised: 04/07/2017 Document Reviewed: 04/07/2017 Elsevier Interactive Patient Education  2019 Else[employer] Inc.   Bone Density Test The bone density test uses a special type of X-ray to measure the amount of calcium and other minerals in your bones. It can measure bone  density in the hip and the spine. The test procedure is similar to having a regular X-ray. This test may also be called:  Bone densitometry.  Bone mineral density test.  Dual-energy X-ray absorptiometry (DEXA). You may have this test to:  Diagnose a condition that causes weak or thin bones (osteoporosis).  Screen you for osteoporosis.  Predict your risk for a broken bone (fracture).  Determine how well your osteoporosis treatment is working. Tell a health care provider about:  Any allergies you have.  All medicines you are taking, including vitamins, herbs, eye drops, creams, and over-the-counter medicines.  Any problems you or family members have had with anesthetic medicines.  Any blood disorders you have.  Any surgeries you have had.  Any medical conditions you have.  Whether you are pregnant or may be pregnant.  Any medical tests you have had within the past 14 days that used contrast material. What are the risks? Generally, this is a safe procedure. However,  it does expose you to a small amount of radiation, which can slightly increase your cancer risk. What happens before the procedure?  Do not take any calcium supplements starting 24 hours before your test.  Remove all metal jewelry, eyeglasses, dental appliances, and any other metal objects. What happens during the procedure?   You will lie down on an exam table. There will be an X-ray generator below you and an imaging device above you.  Other devices, such as boxes or braces, may be used to position your body properly for the scan.  The machine will slowly scan your body. You will need to keep still.  The images will show up on a screen in the room. Images will be examined by a specialist after your test is done. The procedure may vary among health care providers and hospitals. What happens after the procedure?  It is up to you to get your test results. Ask your health care provider, or the department that is doing the test, when your results will be ready. Summary  A bone density test is an imaging test that uses a type of X-ray to measure the amount of calcium and other minerals in your bones.  The test may be used to diagnose or screen you for a condition that causes weak or thin bones (osteoporosis), predict your risk for a broken bone (fracture), or determine how well your osteoporosis treatment is working.  Do not take any calcium supplements starting 24 hours before your test.  Ask your health care provider, or the department that is doing the test, when your results will be ready. This information is not intended to replace advice given to you by your health care provider. Make sure you discuss any questions you have with your health care provider. Document Released: 06/16/2004 Document Revised: 03/29/2017 Document Reviewed: 03/29/2017 Elsevier Interactive Patient Education  2019 [employer].   Colonoscopy, Adult A colonoscopy is an exam to look at the entire large  intestine. During the exam, a lubricated, flexible tube that has a camera on the end of it is inserted into the anus and then passed into the rectum, colon, and other parts of the large intestine. You may have a colonoscopy as a part of normal colorectal screening or if you have certain symptoms, such as:  Lack of red blood cells (anemia).  Diarrhea that does not go away.  Abdominal pain.  Blood in your stool (feces). A colonoscopy can help screen for and diagnose medical problems, including:  Tumors.  Polyps.  Inflammation.  Areas of bleeding. Tell a health care provider about:  Any allergies you have.  All medicines you are taking, including vitamins, herbs, eye drops, creams, and over-the-counter medicines.  Any problems you or family members have had with anesthetic medicines.  Any blood disorders you have.  Any surgeries you have had.  Any medical conditions you have.  Any problems you have had passing stool. What are the risks? Generally, this is a safe procedure. However, problems may occur, including:  Bleeding.  A tear in the intestine.  A reaction to medicines given during the exam.  Infection (rare). What happens before the procedure? Eating and drinking restrictions Follow instructions from your health care provider about eating and drinking, which may include:  A few days before the procedure - follow a low-fiber diet. Avoid nuts, seeds, dried fruit, raw fruits, and vegetables.  1-3 days before the procedure - follow a clear liquid diet. Drink only clear liquids, such as clear broth or bouillon, black coffee or tea, clear juice, clear soft drinks or sports drinks, gelatin dessert, and popsicles. Avoid any liquids that contain red or purple dye.  On the day of the procedure - do not eat or drink anything starting 2 hours before the procedure, or within the time period that your health care provider recommends. Up to 2 hours before the procedure, you may  continue to drink clear liquids, such as water or clear fruit juice. Bowel prep If you were prescribed an oral bowel prep to clean out your colon:  Take it as told by your health care provider. Starting the day before your procedure, you will need to drink a large amount of medicated liquid. The liquid will cause you to have multiple loose stools until your stool is almost clear or light green.  If your skin or anus gets irritated from diarrhea, you may use these to relieve the irritation: ? Medicated wipes, such as adult wet wipes with aloe and vitamin E. ? A skin-soothing product like petroleum jelly.  If you vomit while drinking the bowel prep, take a break for up to 60 minutes and then begin the bowel prep again. If vomiting continues and you cannot take the bowel prep without vomiting, call your health care provider.  To clean out your colon, you may also be given: ? Laxative medicines. ? Instructions about how to use an enema. General instructions  Ask your health care provider about: ? Changing or stopping your regular medicines or supplements. This is especially important if you are taking iron supplements, diabetes medicines, or blood thinners. ? Taking medicines such as aspirin and ibuprofen. These medicines can thin your blood. Do not take these medicines before the procedure if your health care provider tells you not to.  Plan to have someone take you home from the hospital or clinic. What happens during the procedure?   An IV may be inserted into one of your veins.  You will be given medicine to help you relax (sedative).  To reduce your risk of infection: ? Your health care team will wash or sanitize their hands. ? Your anal area will be washed with soap.  You will be asked to lie on your side with your knees bent.  Your health care provider will lubricate a long, thin, flexible tube. The tube will have a camera and a light on the end.  The tube will be inserted  into your anus.  The tube will be gently  eased through your rectum and colon.  Air will be delivered into your colon to keep it open. You may feel some pressure or cramping.  The camera will be used to take images during the procedure.  A small tissue sample may be removed to be examined under a microscope (biopsy).  If small polyps are found, your health care provider may remove them and have them checked for cancer cells.  When the exam is done, the tube will be removed. The procedure may vary among health care providers and hospitals. What happens after the procedure?  Your blood pressure, heart rate, breathing rate, and blood oxygen level will be monitored until the medicines you were given have worn off.  Do not drive for 24 hours after the exam.  You may have a small amount of blood in your stool.  You may pass gas and have mild abdominal cramping or bloating due to the air that was used to inflate your colon during the exam.  It is up to you to get the results of your procedure. Ask your health care provider, or the department performing the procedure, when your results will be ready. Summary  A colonoscopy is an exam to look at the entire large intestine.  During a colonoscopy, a lubricated, flexible tube with a camera on the end of it is inserted into the anus and then passed into the colon and other parts of the large intestine.  Follow instructions from your health care provider about eating and drinking before the procedure.  If you were prescribed an oral bowel prep to clean out your colon, take it as told by your health care provider.  After your procedure, your blood pressure, heart rate, breathing rate, and blood oxygen level will be monitored until the medicines you were given have worn off. This information is not intended to replace advice given to you by your health care provider. Make sure you discuss any questions you have with your health care  provider. Document Released: 05/22/2000 Document Revised: 03/17/2017 Document Reviewed: 08/06/2015 Elsevier Interactive Patient Education  2019 [place] Prevention in the Home, Adult Falls can cause injuries. They can happen to people of all ages. There are many things you can do to make your home safe and to help prevent falls. Ask for help when making these changes, if needed. What actions can I take to prevent falls? General Instructions  Use good lighting in all rooms. Replace any light bulbs that burn out.  Turn on the lights when you go into a dark area. Use night-lights.  Keep items that you use often in easy-to-reach places. Lower the shelves around your home if necessary.  Set up your furniture so you have a clear path. Avoid moving your furniture around.  Do not have throw rugs and other things on the floor that can make you trip.  Avoid walking on wet floors.  If any of your floors are uneven, fix them.  Add color or contrast paint or tape to clearly mark and help you see: ? Any grab bars or handrails. ? First and last steps of stairways. ? Where the edge of each step is.  If you use a stepladder: ? Make sure that it is fully opened. Do not climb a closed stepladder. ? Make sure that both sides of the stepladder are locked into place. ? Ask someone to hold the stepladder for you while you use it.  If there are any pets around  you, be aware of where they are. What can I do in the bathroom?      Keep the floor dry. Clean up any water that spills onto the floor as soon as it happens.  Remove soap buildup in the tub or shower regularly.  Use non-skid mats or decals on the floor of the tub or shower.  Attach bath mats securely with double-sided, non-slip rug tape.  If you need to sit down in the shower, use a plastic, non-slip stool.  Install grab bars by the toilet and in the tub and shower. Do not use towel bars as grab bars. What can I do in  the bedroom?  Make sure that you have a light by your bed that is easy to reach.  Do not use any sheets or blankets that are too big for your bed. They should not hang down onto the floor.  Have a firm chair that has side arms. You can use this for support while you get dressed. What can I do in the kitchen?  Clean up any spills right away.  If you need to reach something above you, use a strong step stool that has a grab bar.  Keep electrical cords out of the way.  Do not use floor polish or wax that makes floors slippery. If you must use wax, use non-skid floor wax. What can I do with my stairs?  Do not leave any items on the stairs.  Make sure that you have a light switch at the top of the stairs and the bottom of the stairs. If you do not have them, ask someone to add them for you.  Make sure that there are handrails on both sides of the stairs, and use them. Fix handrails that are broken or loose. Make sure that handrails are as long as the stairways.  Install non-slip stair treads on all stairs in your home.  Avoid having throw rugs at the top or bottom of the stairs. If you do have throw rugs, attach them to the floor with carpet tape.  Choose a carpet that does not hide the edge of the steps on the stairway.  Check any carpeting to make sure that it is firmly attached to the stairs. Fix any carpet that is loose or worn. What can I do on the outside of my home?  Use bright outdoor lighting.  Regularly fix the edges of walkways and driveways and fix any cracks.  Remove anything that might make you trip as you walk through a door, such as a raised step or threshold.  Trim any bushes or trees on the path to your home.  Regularly check to see if handrails are loose or broken. Make sure that both sides of any steps have handrails.  Install guardrails along the edges of any raised decks and porches.  Clear walking paths of anything that might make someone trip, such as  tools or rocks.  Have any leaves, snow, or ice cleared regularly.  Use sand or salt on walking paths during winter.  Clean up any spills in your garage right away. This includes grease or oil spills. What other actions can I take?  Wear shoes that: ? Have a low heel. Do not wear high heels. ? Have rubber bottoms. ? Are comfortable and fit you well. ? Are closed at the toe. Do not wear open-toe sandals.  Use tools that help you move around (mobility aids) if they are needed. These include: ?  Canes. ? Walkers. ? Scooters. ? Crutches.  Review your medicines with your doctor. Some medicines can make you feel dizzy. This can increase your chance of falling. Ask your doctor what other things you can do to help prevent falls. Where to find more information  Centers for Disease Control and Prevention, [facility]AD[employer]: https://garcia.biz/  [employer] on Aging: BrainJudge.co.uk Contact a doctor if:  You are afraid of falling at home.  You feel weak, drowsy, or dizzy at home.  You fall at home. Summary  There are many simple things that you can do to make your home safe and to help prevent falls.  Ways to make your home safe include removing tripping hazards and installing grab bars in the bathroom.  Ask for help when making these changes in your home. This information is not intended to replace advice given to you by your health care provider. Make sure you discuss any questions you have with your health care provider. Document Released: 03/21/2009 Document Revised: 01/07/2017 Document Reviewed: 01/07/2017 Elsevier Interactive Patient Education  2019 [place] Maintenance, Female Adopting a healthy lifestyle and getting preventive care can go a long way to promote health and wellness. Talk with your health care provider about what schedule of regular examinations is right for you. This is a good chance for you to check in with your provider about disease  prevention and staying healthy. In between checkups, there are plenty of things you can do on your own. Experts have done a lot of research about which lifestyle changes and preventive measures are most likely to keep you healthy. Ask your health care provider for more information. Weight and diet Eat a healthy diet  Be sure to include plenty of vegetables, fruits, low-fat dairy products, and lean protein.  Do not eat a lot of foods high in solid fats, added sugars, or salt.  Get regular exercise. This is one of the most important things you can do for your health. ? Most adults should exercise for at least 150 minutes each week. The exercise should increase your heart rate and make you sweat (moderate-intensity exercise). ? Most adults should also do strengthening exercises at least twice a week. This is in addition to the moderate-intensity exercise. Maintain a healthy weight  Body mass index (BMI) is a measurement that can be used to identify possible weight problems. It estimates body fat based on height and weight. Your health care provider can help determine your BMI and help you achieve or maintain a healthy weight.  For females 59 years of age and older: ? A BMI below 18.5 is considered underweight. ? A BMI of 18.5 to 24.9 is normal. ? A BMI of 25 to 29.9 is considered overweight. ? A BMI of 30 and above is considered obese. Watch levels of cholesterol and blood lipids  You should start having your blood tested for lipids and cholesterol at 78 years of age, then have this test every 5 years.  You may need to have your cholesterol levels checked more often if: ? Your lipid or cholesterol levels are high. ? You are older than 78 years of age. ? You are at high risk for heart disease. Cancer screening Lung Cancer  Lung cancer screening is recommended for adults 79-80 years old who are at high risk for lung cancer because of a history of smoking.  A yearly low-dose CT scan of  the lungs is recommended for people who: ? Currently smoke. ? Have quit  within the past 15 years. ? Have at least a 30-pack-year history of smoking. A pack year is smoking an average of one pack of cigarettes a day for 1 year.  Yearly screening should continue until it has been 15 years since you quit.  Yearly screening should stop if you develop a health problem that would prevent you from having lung cancer treatment. Breast Cancer  Practice breast self-awareness. This means understanding how your breasts normally appear and feel.  It also means doing regular breast self-exams. Let your health care provider know about any changes, no matter how small.  If you are in your 20s or 30s, you should have a clinical breast exam (CBE) by a health care provider every 1-3 years as part of a regular health exam.  If you are 48 or older, have a CBE every year. Also consider having a breast X-ray (mammogram) every year.  If you have a family history of breast cancer, talk to your health care provider about genetic screening.  If you are at high risk for breast cancer, talk to your health care provider about having an MRI and a mammogram every year.  Breast cancer gene (BRCA) assessment is recommended for women who have family members with BRCA-related cancers. BRCA-related cancers include: ? Breast. ? Ovarian. ? Tubal. ? Peritoneal cancers.  Results of the assessment will determine the need for genetic counseling and BRCA1 and BRCA2 testing. Cervical Cancer Your health care provider may recommend that you be screened regularly for cancer of the pelvic organs (ovaries, uterus, and vagina). This screening involves a pelvic examination, including checking for microscopic changes to the surface of your cervix (Pap test). You may be encouraged to have this screening done every 3 years, beginning at age 42.  For women ages 52-65, health care providers may recommend pelvic exams and Pap testing every 3  years, or they may recommend the Pap and pelvic exam, combined with testing for human papilloma virus (HPV), every 5 years. Some types of HPV increase your risk of cervical cancer. Testing for HPV may also be done on women of any age with unclear Pap test results.  Other health care providers may not recommend any screening for nonpregnant women who are considered low risk for pelvic cancer and who do not have symptoms. Ask your health care provider if a screening pelvic exam is right for you.  If you have had past treatment for cervical cancer or a condition that could lead to cancer, you need Pap tests and screening for cancer for at least 20 years after your treatment. If Pap tests have been discontinued, your risk factors (such as having a new sexual partner) need to be reassessed to determine if screening should resume. Some women have medical problems that increase the chance of getting cervical cancer. In these cases, your health care provider may recommend more frequent screening and Pap tests. Colorectal Cancer  This type of cancer can be detected and often prevented.  Routine colorectal cancer screening usually begins at 78 years of age and continues through 78 years of age.  Your health care provider may recommend screening at an earlier age if you have risk factors for colon cancer.  Your health care provider may also recommend using home test kits to check for hidden blood in the stool.  A small camera at the end of a tube can be used to examine your colon directly (sigmoidoscopy or colonoscopy). This is done to check for the earliest forms  of colorectal cancer.  Routine screening usually begins at age 67.  Direct examination of the colon should be repeated every 5-10 years through 78 years of age. However, you may need to be screened more often if early forms of precancerous polyps or small growths are found. Skin Cancer  Check your skin from head to toe regularly.  Tell your  health care provider about any new moles or changes in moles, especially if there is a change in a mole's shape or color.  Also tell your health care provider if you have a mole that is larger than the size of a pencil eraser.  Always use sunscreen. Apply sunscreen liberally and repeatedly throughout the day.  Protect yourself by wearing long sleeves, pants, a wide-brimmed hat, and sunglasses whenever you are outside. Heart disease, diabetes, and high blood pressure  High blood pressure causes heart disease and increases the risk of stroke. High blood pressure is more likely to develop in: ? People who have blood pressure in the high end of the normal range (130-139/85-89 mm Hg). ? People who are overweight or obese. ? People who are African American.  If you are 29-49 years of age, have your blood pressure checked every 3-5 years. If you are 86 years of age or older, have your blood pressure checked every year. You should have your blood pressure measured twice-once when you are at a hospital or clinic, and once when you are not at a hospital or clinic. Record the average of the two measurements. To check your blood pressure when you are not at a hospital or clinic, you can use: ? An automated blood pressure machine at a pharmacy. ? A home blood pressure monitor.  If you are between 59 years and 50 years old, ask your health care provider if you should take aspirin to prevent strokes.  Have regular diabetes screenings. This involves taking a blood sample to check your fasting blood sugar level. ? If you are at a normal weight and have a low risk for diabetes, have this test once every three years after 78 years of age. ? If you are overweight and have a high risk for diabetes, consider being tested at a younger age or more often. Preventing infection Hepatitis B  If you have a higher risk for hepatitis B, you should be screened for this virus. You are considered at high risk for hepatitis  B if: ? You were born in a country where hepatitis B is common. Ask your health care provider which countries are considered high risk. ? Your parents were born in a high-risk country, and you have not been immunized against hepatitis B (hepatitis B vaccine). ? You have HIV or AIDS. ? You use needles to inject street drugs. ? You live with someone who has hepatitis B. ? You have had sex with someone who has hepatitis B. ? You get hemodialysis treatment. ? You take certain medicines for conditions, including cancer, organ transplantation, and autoimmune conditions. Hepatitis C  Blood testing is recommended for: ? Everyone born from [birth year] through 1965. ? Anyone with known risk factors for hepatitis C. Sexually transmitted infections (STIs)  You should be screened for sexually transmitted infections (STIs) including gonorrhea and chlamydia if: ? You are sexually active and are younger than 78 years of age. ? You are older than 78 years of age and your health care provider tells you that you are at risk for this type of infection. ? Your sexual activity  has changed since you were last screened and you are at an increased risk for chlamydia or gonorrhea. Ask your health care provider if you are at risk.  If you do not have HIV, but are at risk, it may be recommended that you take a prescription medicine daily to prevent HIV infection. This is called pre-exposure prophylaxis (PrEP). You are considered at risk if: ? You are sexually active and do not regularly use condoms or know the HIV status of your partner(s). ? You take drugs by injection. ? You are sexually active with a partner who has HIV. Talk with your health care provider about whether you are at high risk of being infected with HIV. If you choose to begin PrEP, you should first be tested for HIV. You should then be tested every 3 months for as long as you are taking PrEP. Pregnancy  If you are premenopausal and you may become  pregnant, ask your health care provider about preconception counseling.  If you may become pregnant, take 400 to 800 micrograms (mcg) of folic acid every day.  If you want to prevent pregnancy, talk to your health care provider about birth control (contraception). Osteoporosis and menopause  Osteoporosis is a disease in which the bones lose minerals and strength with aging. This can result in serious bone fractures. Your risk for osteoporosis can be identified using a bone density scan.  If you are 47 years of age or older, or if you are at risk for osteoporosis and fractures, ask your health care provider if you should be screened.  Ask your health care provider whether you should take a calcium or vitamin D supplement to lower your risk for osteoporosis.  Menopause may have certain physical symptoms and risks.  Hormone replacement therapy may reduce some of these symptoms and risks. Talk to your health care provider about whether hormone replacement therapy is right for you. Follow these instructions at home:  Schedule regular health, dental, and eye exams.  Stay current with your immunizations.  Do not use any tobacco products including cigarettes, chewing tobacco, or electronic cigarettes.  If you are pregnant, do not drink alcohol.  If you are breastfeeding, limit how much and how often you drink alcohol.  Limit alcohol intake to no more than 1 drink per day for nonpregnant women. One drink equals 12 ounces of beer, 5 ounces of wine, or 1 ounces of hard liquor.  Do not use street drugs.  Do not share needles.  Ask your health care provider for help if you need support or information about quitting drugs.  Tell your health care provider if you often feel depressed.  Tell your health care provider if you have ever been abused or do not feel safe at home. This information is not intended to replace advice given to you by your health care provider. Make sure you discuss any  questions you have with your health care provider. Document Released: 12/08/2010 Document Revised: 10/31/2015 Document Reviewed: 02/26/2015 Elsevier Interactive Patient Education  2019 [employer].

## 2018-07-20 NOTE — Telephone Encounter (Signed)
During AWV, pt. stated she has had diarrhea X 4 months, and has not changed diet or medications recently. Weight loss of 3 pounds noted over last 4 months. Pt. denies changes in appetite/hydration level. Pt. declined to make OV with PCP to discuss, but stated she was open to suggestions from [place], and thought cutting down on her vaping might help with the GI symptoms. Author stated she would route concern to PCP. Pt. does not see GI at this time, but pt. due for colonoscopy, order placed.

## 2018-07-20 NOTE — Progress Notes (Signed)
Subjective:   Jody Moore is a 78 y.o. female who presents for an Initial Medicare Annual Wellness Visit.  Review of Systems    No ROS.  Medicare Wellness Visit. Additional risk factors are reflected in the social history.   Cardiac Risk Factors include: advanced age (>44men, >43 women);sedentary lifestyle Sleep patterns: falls asleep easily, feels rested on waking and sleeps 8 hours nightly.    Home Safety/Smoke Alarms: Feels safe in home. Smoke alarms in place.  Living environment; residence and Firearm Safety: 1-story house/ trailer, equipment: Radio producer, Type: [place] and Walkers, Type: Conservation officer, nature. States her two kittens are a fall hazard. Fall precautions discussed.   Seat Belt Safety/Bike Helmet: Wears seat belt.   Female:   Pap- N/A d/t age       21- 08/2015, overdue. Pt. Not interested in doing repeat.    Dexa scan- 08/2015, osteoporosis, overdue. Pt. Agreed to have placed.      CCS- 04/2001, recommended 3 year follow up per report, overdue. Pt ok with placing order, especially in light of recent diarrhea X 4 months.      Objective:    Today's Vitals   There is no height or weight on file to calculate BMI.  Advanced Directives 07/20/2018 09/02/2017 02/09/2017 01/14/2017 12/28/2016 12/21/2016 12/21/2016  Does Patient Have a Medical Advance Directive? Yes Yes Yes Yes Yes Yes Yes  Type of Paramedic of [place];Living will [place];Out of facility DNR (pink MOST or yellow form) [place];Out of facility DNR (pink MOST or yellow form) [place];Out of facility DNR (pink MOST or yellow form) Out of facility DNR (pink MOST or yellow form) [place];Living will [place];Living will  Does patient want to make changes to medical advance directive? No - Patient declined - - - - No - Patient declined No - Patient declined  Copy of [place] in Chart? Yes - validated most recent copy scanned in chart (See row information) - Yes - - No - copy requested No - copy requested  Would patient like information on creating a medical advance directive? - - - - - - -  Pre-existing out of facility DNR order (yellow form or pink MOST form) - Yellow form placed in chart (order not valid for inpatient use);Pink MOST form placed in chart (order not valid for inpatient use) Yellow form placed in chart (order not valid for inpatient use) Yellow form placed in chart (order not valid for inpatient use) Yellow form placed in chart (order not valid for inpatient use) - -    Current Medications (verified) Outpatient Encounter Medications as of 07/20/2018  Medication Sig  . calcium gluconate 500 MG tablet Take 1 tablet by mouth daily.  [name] ibuprofen (ADVIL,MOTRIN) 200 MG tablet Take 200 mg by mouth every 6 (six) hours as needed.  . Melatonin 3 MG TABS Take 1 tablet by mouth daily.  . Multiple Vitamins-Minerals (MULTIVITAMIN WITH MINERALS) tablet Take 1 tablet by mouth daily.  [name] PARoxetine (PAXIL) 40 MG tablet TAKE 1 TABLET(40 MG) BY MOUTH DAILY  . QUEtiapine (SEROQUEL) 50 MG tablet TAKE 1 TABLET(50 MG) BY MOUTH AT BEDTIME  . topiramate (TOPAMAX) 25 MG tablet TAKE 1 TABLET BY MOUTH TWICE DAILY  . Vitamin D, Ergocalciferol, (DRISDOL) 1.25 MG (50000 UT) CAPS capsule Take 1 capsule (50,000 Units total) by mouth every 7 (seven) days. TAKE ONE CAPSULE BY MOUTH EVERY 7  DAYS   No facility-administered encounter medications on file as of 07/20/2018.     Allergies (verified) Influenza vaccines; Omeprazole; and Codeine sulfate   History: Past Medical History:  Diagnosis Date  . Cervical cancer ([place])   . Depression   . Essential tremor 10/04/2013  . Femur fracture ([place]) 12/22/2016  . Femur fracture, right ([place]) 12/21/2016  . Gastroesophageal reflux disease without esophagitis 07/10/2015  . 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 Osteoporosis 07/10/2015   . 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 Reflux   . Severe episode of recurrent major depressive disorder, without psychotic features ([place]) 07/10/2015  . Severe protein-calorie malnutrition ([place]) 12/29/2016  . Situational anxiety 07/29/2016  . Traumatic compression fracture of T11 thoracic vertebra, with routine healing, subsequent encounter 12/29/2016   Past Surgical History:  Procedure Laterality Date  . ANKLE SURGERY    . CATARACT EXTRACTION Bilateral   . CERVIX SURGERY  1980   cancer-radiation   . FEMUR SURGERY  2005   fractured right femur   . HIP SURGERY  2006   fractured right hip   . IR RADIOLOGIST EVAL & MGMT  01/14/2017  . ORIF FEMUR FRACTURE Right 12/22/2016   Procedure: OPEN REDUCTION INTERNAL FIXATION (ORIF) DISTAL FEMUR FRACTURE;  Surgeon: [name], MD;  Location: [facility];  Service: Orthopedics;  Laterality: Right;  . TONSILLECTOMY     Family History  Problem Relation Age of Onset  . Heart disease Father   . Bladder Cancer Father   . Diabetes Father    Social History   Socioeconomic History  . Marital status: Widowed    Spouse name: Not on file  . Number of children: 0  . Years of education: Not on file  . Highest education level: Not on file  Occupational History  . Occupation: retired    Comment: ad Designer, jewellery  . Financial resource strain: Not hard at all  . Food insecurity:    Worry: Never true    Inability: Never true  . Transportation needs:    Medical: No    Non-medical: No  Tobacco Use  . Smoking status: Former Smoker    Packs/day: 2.00    Types: Cigarettes    Last attempt to quit: 12/2016    Years since quitting: 1.6  . Smokeless tobacco: Never Used  Substance and Sexual Activity  . Alcohol use: No    Frequency: Never  . Drug use: No  . Sexual activity: Never  Lifestyle  . Physical activity:    Days per week: 0 days    Minutes per session: 0 min  . Stress: Only a little  Relationships  . Social connections:     Talks on phone: More than three times a week    Gets together: More than three times a week    Attends religious service: Not on file    Active member of club or organization: No    Attends meetings of clubs or organizations: Never    Relationship status: Widowed  Other Topics Concern  . Not on file  Social History Narrative   Diet:   Do you drink/eat things with caffeine? yes   Marital status:  Married                            What year were you married? 2015   Do you live in a house, apartment, assisted living, condo, trailer, etc)? house  Is it one or more stories? yes   How many persons live in your home? 2                                                                                               Do you have any pets in your home?  2 cats   Current or past profession: Hospital doctor   Do you exercise?    no                                                 Type & how often:   Do you have a living will?  no   Do you have a DNR Form?  no   Do you have a POA/HPOA forms?  No   Admitted to [facility] 12/25/16      DNR      07/20/18: Lives alone in one-level home, has two cats.   Sister, Jody Moore, local and assists as needed, but pt. mostly independent with ADLs, IADLs   Relies heavily on sister for social/emotional support    Tobacco Counseling Counseling given: Not Answered   Activities of Daily Living In your present state of health, do you have any difficulty performing the following activities: 07/20/2018  Hearing? N  Vision? N  Difficulty concentrating or making decisions? N  Walking or climbing stairs? Y  Dressing or bathing? N  Doing errands, shopping? N  Preparing Food and eating ? N  Comment does not cook anymore; sister assists at times  Using the Toilet? N  In the past six months, have you accidently leaked urine? N  Do you have problems with loss of bowel control? N  Managing your Medications? N  Managing your Finances? N  Housekeeping or  managing your Housekeeping? N  Comment has friend who helps with cats  Some recent data might be hidden     Immunizations and Health Maintenance Immunization History  Administered Date(s) Administered  . Influenza,inj,Quad PF,6+ Mos 07/10/2015  . PPD Test 12/25/2016  . Pneumococcal Polysaccharide-23 07/10/2015  . Tdap 12/21/2016   There are no preventive care reminders to display for this patient.  Patient Care Team: [name], NP as PCP - General (Family Medicine)  Indicate any recent Medical Services you may have received from other than [facility] providers in the past year (date may be approximate).     Assessment:   This is a routine wellness examination for [place]. Physical assessment deferred to PCP.   Hearing/Vision screen Hearing Screening Comments: Able to hear conversational tones w/o difficulty. No issues reported.   Passed whisper test.  Vision Screening Comments: Recently saw optometrist, denies any acute changes. Declined vision exam.  Dietary issues and exercise activities discussed: Current Exercise Habits: The patient has a physically strenuous job, but has no regular exercise apart from work., Exercise limited by: cardiac condition(s);psychological condition(s);neurologic condition(s) Diet (meal preparation, eat out, water intake, caffeinated beverages, dairy products, fruits and vegetables): enjoys  eating canned soups, applesauce, pimento cheese, bananas and crackres. Drinks ICE brand flavored water. Sister, Jody Moore at side, cooks for pt. Occasionally. States her appetite is still good, but pt. Been steadily losing weight. Diarrhea X 4 months. PCP to be made aware.      Goals    . Patient Stated     Cut down on vaping to see if diarrhea improves; keep mentally and physically active.  No falls!!      Depression Screen PHQ 2/9 Scores 07/20/2018 06/03/2017 10/09/2015  PHQ - 2 Score 0 4 0  PHQ- 9 Score 0 8 -   Pt. Stated she struggled significantly after her  husband's death 2018-2019, but has been doing OK recently. Refused counseling resources.    Fall Risk Fall Risk  07/20/2018 06/03/2017 07/29/2016 10/09/2015  Falls in the past year? 0 Yes No No  Number falls in past yr: - 1 - -  Risk for fall due to : History of fall(s);Impaired balance/gait;Impaired vision;Impaired mobility - - -  Follow up Education provided;Falls prevention discussed - - -     Cognitive Function: MMSE - Mini Mental State Exam 10/09/2015  Orientation to time 5  Orientation to Place 5  Registration 3  Attention/ Calculation 5  Recall 3  Language- name 2 objects 2  Language- repeat 1  Language- follow 3 step command 3  Language- read & follow direction 1  Write a sentence 1  Copy design 1  Total score 30  Pt. Declined to have MMSE performed, although admitted to feeling like her memory has worsened since her stay in rehab. Pt. Remains A&OX4, denies getting lost, or forgetting what she's doing. When asked, Jody Moore stated she has not noticed anything concerning.      Screening Tests Health Maintenance  Topic Date Due  . PNA vac Low Risk Adult (2 of 2 - PCV13) 07/21/2019 (Originally 07/09/2016)  . TETANUS/TDAP  12/22/2026  . DEXA SCAN  Completed    Qualifies for Shingles Vaccine? Yes, instructed to get at local pharmacy. Pt. Stated she was not interested at this time.     Plan:    Colonoscopy and bone density scan orders placed. You should receive a call within the next week or so to schedule both.  Continue doing brain stimulating activities (puzzles, reading, adult coloring books, staying active) to keep memory sharp.   Continue staying hydrated, replacing electrolytes while you are having diarrhea. Consider gatorade. Will notify [facility]   Continue chair/weight bearing exercises to prevent fractures/injuries.  Refer to cholesterol lowering diet information provided. I have personally reviewed and noted the following in the patient's chart:   . Medical and  social history . Use of alcohol, tobacco or illicit drugs  . Current medications and supplements . Functional ability and status . Nutritional status . Physical activity . Advanced directives . List of other physicians . Hospitalizations, surgeries, and ER visits in previous 12 months . Vitals . Screenings to include cognitive, depression, and falls . Referrals and appointments  In addition, I have reviewed and discussed with patient certain preventive protocols, quality metrics, and best practice recommendations. A written personalized care plan for preventive services as well as general preventive health recommendations were provided to patient.     Alphia Moh, RN   07/20/2018

## 2018-07-20 NOTE — Telephone Encounter (Signed)
She can take Imodium 1/2 tab twice a day to start off with. If symptoms continue she will need to follow up for further evaluation. Thanks for placing order for colonoscopy.

## 2018-07-21 NOTE — Telephone Encounter (Signed)
Author phoned pt. to relay PCP's message. Spoke to [place], sister on [place]; sylvia stated she would relay.

## 2018-08-19 ENCOUNTER — Encounter: Payer: Self-pay | Admitting: Adult Health

## 2018-08-22 ENCOUNTER — Encounter: Payer: Self-pay | Admitting: Adult Health

## 2018-09-02 ENCOUNTER — Other Ambulatory Visit: Payer: Self-pay | Admitting: Adult Health

## 2018-09-02 DIAGNOSIS — F332 Major depressive disorder, recurrent severe without psychotic features: Secondary | ICD-10-CM

## 2018-09-05 NOTE — Telephone Encounter (Signed)
Sent to the pharmacy by e-scribe for 90 days. 

## 2018-10-03 ENCOUNTER — Other Ambulatory Visit: Payer: Self-pay | Admitting: Adult Health

## 2018-11-30 ENCOUNTER — Other Ambulatory Visit: Payer: Self-pay | Admitting: Adult Health

## 2018-11-30 DIAGNOSIS — F332 Major depressive disorder, recurrent severe without psychotic features: Secondary | ICD-10-CM

## 2018-12-13 ENCOUNTER — Other Ambulatory Visit: Payer: Self-pay | Admitting: Adult Health

## 2018-12-13 DIAGNOSIS — F32A Depression, unspecified: Secondary | ICD-10-CM

## 2018-12-13 DIAGNOSIS — R441 Visual hallucinations: Secondary | ICD-10-CM

## 2018-12-13 DIAGNOSIS — Z760 Encounter for issue of repeat prescription: Secondary | ICD-10-CM

## 2018-12-13 DIAGNOSIS — F329 Major depressive disorder, single episode, unspecified: Secondary | ICD-10-CM

## 2018-12-24 ENCOUNTER — Other Ambulatory Visit: Payer: Self-pay | Admitting: Adult Health

## 2019-01-16 ENCOUNTER — Encounter: Payer: Self-pay | Admitting: Adult Health

## 2019-01-17 ENCOUNTER — Other Ambulatory Visit: Payer: Self-pay | Admitting: Adult Health

## 2019-01-17 MED ORDER — ERYTHROMYCIN 5 MG/GM OP OINT: 1.0000 "application " | TOPICAL_OINTMENT | Freq: Four times a day (QID) | OPHTHALMIC | 0 refills | Status: AC

## 2019-01-20 ENCOUNTER — Telehealth (INDEPENDENT_AMBULATORY_CARE_PROVIDER_SITE_OTHER): Payer: Medicare Other | Admitting: Family Medicine

## 2019-01-20 ENCOUNTER — Other Ambulatory Visit: Payer: Self-pay

## 2019-01-20 DIAGNOSIS — R634 Abnormal weight loss: Secondary | ICD-10-CM

## 2019-01-20 DIAGNOSIS — H9203 Otalgia, bilateral: Secondary | ICD-10-CM | POA: Diagnosis not present

## 2019-01-20 NOTE — Progress Notes (Signed)
This visit type was conducted due to national recommendations for restrictions regarding the COVID-19 pandemic in an effort to limit this patient's exposure and mitigate transmission in our community.   Virtual Visit via Telephone Note  I connected with Jody Moore on 01/20/19 at  2:00 PM EDT by telephone and verified that I am speaking with the correct person using two identifiers.   I discussed the limitations, risks, security and privacy concerns of performing an evaluation and management service by telephone and the availability of in person appointments. I also discussed with the patient that there may be a patient responsible charge related to this service. The patient expressed understanding and agreed to proceed.  Location patient: home Location provider: work or home office Participants present for the call: patient, provider Patient did not have a visit in the prior 7 days to address this/these issue(s).   History of Present Illness: Patient has history of protein calorie malnutrition, weight loss, history of recurrent depression, history of peripheral neuropathy, osteoporosis, macular degeneration.  She had called initially with complaints of "ear pain ".  On further questioning she states she has been pulling small "filament-like" objects out of her ears which she states look like a "fishing line filament ".  She states these are about 1/4 inch long and have tiny barbs on the hands.  When she pulls these out and rubs them both between her fingers she states they disappear.  She feels they are somehow attached to her ear and possibly nose.  She states that they "permeate the cracks and folds of her ear".  She does not have any reported known delusional disorder.  She feels her depression is stable.  She is currently on Paxil and apparently also takes Seroquel. as well as Topamax.  She has had difficulties maintaining weight and states her current weight is about 80 pounds.  She does  not have any history of seizure or migraine headaches.   Observations/Objective: Patient sounds cheerful and well on the phone. I do not appreciate any SOB. Speech and thought processing are grossly intact. Patient reported vitals:  Assessment and Plan:  #1 patient reports unusual ear symptoms as above.  Question delusions? -Recommend office follow-up next week to further assess  #2 history of weight loss/protein calorie malnutrition. -Would consider discontinuing Topamax if no clear other indication as this could be exacerbating her weight loss -Consider healthy nutritional supplements such as Boost  Follow Up Instructions:  -Set up follow-up next week with primary to further assess her ear symptoms   [id number] [id number] [id number] 11-20 [id number] 21-30 I did not refer this patient for an OV in the next 24 hours for this/these issue(s).  I discussed the assessment and treatment plan with the patient. The patient was provided an opportunity to ask questions and all were answered. The patient agreed with the plan and demonstrated an understanding of the instructions.   The patient was advised to call back or seek an in-person evaluation if the symptoms worsen or if the condition fails to improve as anticipated.  I provided 23 minutes of non-face-to-face time during this encounter.   Carolann Littler, MD

## 2019-01-26 ENCOUNTER — Ambulatory Visit: Payer: Medicare Other | Admitting: Adult Health

## 2019-02-02 ENCOUNTER — Encounter: Payer: Self-pay | Admitting: Adult Health

## 2019-02-20 ENCOUNTER — Encounter: Payer: Self-pay | Admitting: Adult Health

## 2019-02-21 ENCOUNTER — Telehealth: Payer: Self-pay | Admitting: *Deleted

## 2019-02-21 NOTE — Telephone Encounter (Signed)
Copied from [place] [id number]. Topic: General - Other >> Feb 21, 2019  2:23 PM Antonieta Iba C wrote: Reason for CRM: pt's sister called in to schedule per PCP mychart message. Not showing any in office visit openings for this week with provider.   Please assist.

## 2019-02-21 NOTE — Telephone Encounter (Signed)
Left message on machine for sister to return our call to schedule an appointment. CRM

## 2019-02-23 ENCOUNTER — Encounter: Payer: Self-pay | Admitting: Adult Health

## 2019-02-23 ENCOUNTER — Other Ambulatory Visit: Payer: Self-pay

## 2019-02-23 ENCOUNTER — Ambulatory Visit (INDEPENDENT_AMBULATORY_CARE_PROVIDER_SITE_OTHER): Payer: Medicare Other | Admitting: Adult Health

## 2019-02-23 VITALS — BP 140/80 | HR 92 | Temp 97.9°F | Wt 86.0 lb

## 2019-02-23 DIAGNOSIS — F22 Delusional disorders: Secondary | ICD-10-CM | POA: Diagnosis not present

## 2019-02-23 DIAGNOSIS — H04123 Dry eye syndrome of bilateral lacrimal glands: Secondary | ICD-10-CM | POA: Diagnosis not present

## 2019-02-23 MED ORDER — QUETIAPINE FUMARATE 100 MG PO TABS: 100.0000 mg | ORAL_TABLET | Freq: Every day | ORAL | 3 refills | Status: DC

## 2019-02-23 NOTE — Progress Notes (Signed)
Subjective:    Patient ID: Jody Moore, female    DOB: 1940/11/16, 78 y.o.   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  is being evaluated today for follow-up regarding ear pain.  She was seen by another provider in the office via telemedicine visit 3 days ago.  At which time she reported that she was pulling out "small filament-like" objects out of her ears which she stated look like a "fishing line filament".  She felt as though they were somewhat how attached to her ear and possibly in her nose, she stated "they permeate the cracks and folds of my ear".  Over the last month I have received my chart messages from her sister Sunday Spillers informing me that at 1 point she stopped taking Seroquel because she thought it was causing her eyes to become red and watery.  She also thought that it was causing pain in her ears.  She eventually went back on the medication couple weeks ago.  Since that time she has been complaining of pinworms in her ears, her nose, and rectum.  Now believes that the pinworms are what are causing her ear pain in addition to the small filament-like objects.  She has been reportedly placing hydrogen peroxide in bilateral ears multiple times a day to try and kill the pinworms.   Review of Systems See HPI    Past Medical History:  Diagnosis Date  . Cervical cancer ([place])   . Depression   . Essential tremor 10/04/2013  . Femur fracture ([place]) 12/22/2016  . Femur fracture, right ([place]) 12/21/2016  . Gastroesophageal reflux disease without esophagitis 07/10/2015  . 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 Osteoporosis 07/10/2015  . 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 Reflux   . Severe episode of recurrent major depressive disorder, without psychotic features ([place]) 07/10/2015  . Severe protein-calorie malnutrition ([place]) 12/29/2016  . Situational anxiety 07/29/2016  . Traumatic compression fracture of T11 thoracic vertebra, with routine healing, subsequent encounter 12/29/2016    Social History   Socioeconomic History  . Marital status: Widowed    Spouse name: Not on file  . Number of children: 0  . Years of education: Not on file  . Highest education level: Not on file  Occupational History  . Occupation: retired    Comment: ad Designer, jewellery  . Financial resource strain: Not hard at all  . Food insecurity    Worry: Never true    Inability: Never true  . Transportation needs    Medical: No    Non-medical: No  Tobacco Use  . Smoking status: Former Smoker    Packs/day: 2.00    Types: Cigarettes    Quit date: 12/2016    Years since quitting: 2.2  . Smokeless tobacco: Never Used  Substance and Sexual Activity  . Alcohol use: No    Frequency: Never  . Drug use: No  . Sexual activity: Never  Lifestyle  . Physical activity    Days per  week: 0 days    Minutes per session: 0 min  . Stress: Only a little  Relationships  . Social connections    Talks on phone: More than three times a week    Gets together: More than three times a week    Attends religious service: Not on file    Active member of club or organization: No    Attends meetings of clubs or organizations: Never    Relationship status: Widowed  . Intimate partner violence    Fear of current or ex  partner: No    Emotionally abused: No    Physically abused: No    Forced sexual activity: No  Other Topics Concern  . Not on file  Social History Narrative   Diet:   Do you drink/eat things with caffeine? yes   Marital status:  Married                            What year were you married? 2015   Do you live in a house, apartment, assisted living, condo, trailer, etc)? house   Is it one or more stories? yes   How many persons live in your home? 2                                                                                               Do you have any pets in your home?  2 cats   Current or past profession: Hospital doctor   Do you exercise?    no                                                 Type & how often:   Do you have a living will?  no   Do you have a DNR Form?  no   Do you have a POA/HPOA forms?  No   Admitted to [facility] 12/25/16      DNR      07/20/18: Lives alone in one-level home, has two cats.   Sister, Sunday Spillers, local and assists as needed, but pt. mostly independent with ADLs, IADLs   Relies heavily on sister for social/emotional support    Past Surgical History:  Procedure Laterality Date  . ANKLE SURGERY    . CATARACT EXTRACTION Bilateral   . CERVIX SURGERY  1980   cancer-radiation   . FEMUR SURGERY  2005   fractured right femur   . HIP SURGERY  2006   fractured right hip   . IR RADIOLOGIST EVAL & MGMT  01/14/2017  . ORIF FEMUR FRACTURE Right 12/22/2016   Procedure: OPEN REDUCTION INTERNAL FIXATION (ORIF) DISTAL FEMUR FRACTURE;  Surgeon: [name], MD;  Location: [facility];  Service: Orthopedics;  Laterality: Right;  . TONSILLECTOMY      Family History  Problem Relation Age of Onset  . Heart disease Father   . Bladder Cancer Father   .  Diabetes Father     Allergies  Allergen Reactions  . Influenza Vaccines Hives    Hives/swelling/injection site war to the touch. No relief with benadryl   . Omeprazole Hives  . Codeine Sulfate  Rash    Current Outpatient Medications on File Prior to Visit  Medication Sig Dispense Refill  . calcium gluconate 500 MG tablet Take 1 tablet by mouth daily.    [name] ibuprofen (ADVIL,MOTRIN) 200 MG tablet Take 200 mg by mouth every 6 (six) hours as needed.    . Melatonin 3 MG TABS Take 1 tablet by mouth daily.    . Multiple Vitamins-Minerals (MULTIVITAMIN WITH MINERALS) tablet Take 1 tablet by mouth daily.    [name] PARoxetine (PAXIL) 40 MG tablet TAKE 1 TABLET(40 MG) BY MOUTH DAILY 90 tablet 1  . QUEtiapine (SEROQUEL) 50 MG tablet TAKE 1 TABLET(50 MG) BY MOUTH AT BEDTIME 90 tablet 1  . topiramate (TOPAMAX) 25 MG tablet TAKE 1 TABLET BY MOUTH TWICE DAILY 180 tablet 2  . Vitamin D, Ergocalciferol, (DRISDOL) 1.25 MG (50000 UT) CAPS capsule TAKE 1 CAPSULE BY MOUTH EVERY 7 DAYS 12 capsule 1   No current facility-administered medications on file prior to visit.     BP 140/80 (BP Location: Left Arm, Patient Position: Sitting, Cuff Size: Normal)   Pulse 92   Temp 97.9 F (36.6 C) (Temporal)   Wt 86 lb (39 kg)   SpO2 94%   BMI 16.77 kg/m       Objective:   Physical Exam Vitals signs and nursing note reviewed.  Constitutional:      Appearance: Normal appearance.  HENT:     Right Ear: Ear canal and external ear normal. There is no impacted cerumen. Tympanic membrane is erythematous.     Left Ear: Ear canal and external ear normal. There is no impacted cerumen. Tympanic membrane is erythematous.     Ears:     Comments: Irritated and dry bilateral ear canals.  No foreign body seen    Nose: Nose normal. No signs of injury, congestion or rhinorrhea.     Right Nostril: No foreign body.     Left Nostril: No foreign body.     Right Turbinates: Not enlarged, swollen or pale.     Left Turbinates: Not enlarged, swollen or pale.     Mouth/Throat:     Lips: Pink.     Mouth: Mucous membranes are moist.  Eyes:     General: Lids are normal.        Right eye: Discharge (watery ) present. No foreign  body.        Left eye: Discharge (watery) present.No foreign body.     Conjunctiva/sclera: Conjunctivae normal.     Right eye: Right conjunctiva is not injected. No exudate.    Left eye: Left conjunctiva is not injected. No exudate. Cardiovascular:     Rate and Rhythm: Normal rate and regular rhythm.     Pulses: Normal pulses.     Heart sounds: Normal heart sounds.  Pulmonary:     Effort: Pulmonary effort is normal.     Breath sounds: Normal breath sounds.  Genitourinary:    Rectum: Normal. Guaiac result negative. No tenderness.  Musculoskeletal: Normal range of motion.  Skin:    General: Skin is warm and dry.     Capillary Refill: Capillary refill takes less than 2 seconds.  Neurological:     Mental Status: She is alert.  Psychiatric:        Attention  and Perception: She perceives visual hallucinations.        Mood and Affect: Mood and affect normal.        Speech: Speech normal.        Behavior: Behavior normal. Behavior is cooperative.        Thought Content: Thought content is delusional.        Cognition and Memory: Cognition and memory normal.        Judgment: Judgment normal.        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  Her sister was advised to follow-up if symptoms do not resolve in the next week or 2.  She can place all of oil or mineral oil in the ears to help with irritation.  Advised against using hydrogen peroxide - QUEtiapine (SEROQUEL) 100 MG tablet; Take 1 tablet (100 mg total) by mouth at bedtime.  Dispense: 90 tablet; Refill: 3  2. Dry eye syndrome of both eyes -Over-the-counter Visine allergy to help with symptoms  [name], NP

## 2019-02-25 ENCOUNTER — Encounter: Payer: Self-pay | Admitting: Adult Health

## 2019-02-28 NOTE — Telephone Encounter (Signed)
Pt had appt on 02/23/2019.  Nothing further needed.

## 2019-03-01 ENCOUNTER — Encounter: Payer: Self-pay | Admitting: Adult Health

## 2019-03-05 ENCOUNTER — Encounter: Payer: Self-pay | Admitting: Adult Health

## 2019-03-06 ENCOUNTER — Other Ambulatory Visit: Payer: Self-pay | Admitting: Adult Health

## 2019-03-06 DIAGNOSIS — G250 Essential tremor: Secondary | ICD-10-CM

## 2019-03-07 ENCOUNTER — Encounter: Payer: Self-pay | Admitting: Adult Health

## 2019-03-08 ENCOUNTER — Other Ambulatory Visit: Payer: Self-pay | Admitting: Adult Health

## 2019-03-08 DIAGNOSIS — G250 Essential tremor: Secondary | ICD-10-CM

## 2019-03-09 ENCOUNTER — Encounter: Payer: Self-pay | Admitting: Adult Health

## 2019-03-09 NOTE — Telephone Encounter (Signed)
That is fine 

## 2019-03-09 NOTE — Telephone Encounter (Signed)
Sent to the pharmacy by e-scribe for 90 days per Cory. 

## 2019-03-10 ENCOUNTER — Telehealth (INDEPENDENT_AMBULATORY_CARE_PROVIDER_SITE_OTHER): Payer: Medicare Other | Admitting: Adult Health

## 2019-03-10 ENCOUNTER — Other Ambulatory Visit: Payer: Self-pay

## 2019-03-10 DIAGNOSIS — S01301A Unspecified open wound of right ear, initial encounter: Secondary | ICD-10-CM

## 2019-03-10 MED ORDER — DOXYCYCLINE HYCLATE 100 MG PO CAPS: 100.0000 mg | ORAL_CAPSULE | Freq: Two times a day (BID) | ORAL | 0 refills | Status: DC

## 2019-03-10 NOTE — Progress Notes (Signed)
Virtual Visit via Telephone Note  I connected with Jody Moore on 03/10/19 at  1:30 PM EDT by telephone and verified that I am speaking with the correct person using two identifiers.   I discussed the limitations, risks, security and privacy concerns of performing an evaluation and management service by telephone and the availability of in person appointments. I also discussed with the patient that there may be a patient responsible charge related to this service. The patient expressed understanding and agreed to proceed.  Location patient: home Location provider: work or home office Participants present for the call: patient, provider, sister Patient did not have a visit in the prior 7 days to address this/these issue(s).   History of Present Illness: 78 year old female who is being evaluated today for an acute issue.  She has been scratching her right ear and ports 2 wounds with localized redness to the outside of her right ear.  She does have drainage but is unable to describe it.  Her sister reports that wounds look infected but she has not noticed any drainage from the ear canal or the wounds.  Has not had any fevers or chills.  Does have some hallucinations from time to time that she has worms in her ear, she is currently being titrated on Seroquel.   Observations/Objective: Patient sounds cheerful and well on the phone. I do not appreciate any SOB. Speech and thought processing are grossly intact. Patient reported vitals:  Assessment and Plan: 1. Open wound of right ear, unspecified open wound type, initial encounter -We will treat for suspected cellulitis.  Advised to use Neosporin on the outside of the ear.  Will send in doxycycline.  Advise follow-up if no resolution or completion of antibiotics - doxycycline (VIBRAMYCIN) 100 MG capsule; Take 1 capsule (100 mg total) by mouth 2 (two) times daily.  Dispense: 14 capsule; Refill: 0   Follow Up Instructions:  I did not  refer this patient for an OV in the next 24 hours for this/these issue(s).  I discussed the assessment and treatment plan with the patient. The patient was provided an opportunity to ask questions and all were answered. The patient agreed with the plan and demonstrated an understanding of the instructions.   The patient was advised to call back or seek an in-person evaluation if the symptoms worsen or if the condition fails to improve as anticipated.  I provided 18 minutes of non-face-to-face time during this encounter.   Jody Peng, NP

## 2019-03-15 ENCOUNTER — Encounter: Payer: Self-pay | Admitting: Adult Health

## 2019-03-16 ENCOUNTER — Telehealth: Payer: Self-pay | Admitting: Adult Health

## 2019-03-16 NOTE — Telephone Encounter (Signed)
Erroneous entry

## 2019-04-05 ENCOUNTER — Encounter: Payer: Self-pay | Admitting: Adult Health

## 2019-04-06 ENCOUNTER — Other Ambulatory Visit: Payer: Self-pay | Admitting: Adult Health

## 2019-04-06 DIAGNOSIS — G250 Essential tremor: Secondary | ICD-10-CM

## 2019-04-06 MED ORDER — TOPIRAMATE 25 MG PO TABS: 25.0000 mg | ORAL_TABLET | Freq: Two times a day (BID) | ORAL | 3 refills | Status: DC

## 2019-05-01 ENCOUNTER — Encounter: Payer: Self-pay | Admitting: Adult Health

## 2019-05-13 ENCOUNTER — Encounter: Payer: Self-pay | Admitting: Adult Health

## 2019-05-25 ENCOUNTER — Other Ambulatory Visit: Payer: Self-pay | Admitting: Adult Health

## 2019-05-25 DIAGNOSIS — F332 Major depressive disorder, recurrent severe without psychotic features: Secondary | ICD-10-CM

## 2019-06-08 ENCOUNTER — Other Ambulatory Visit: Payer: Self-pay | Admitting: Adult Health

## 2019-06-19 ENCOUNTER — Encounter: Payer: Self-pay | Admitting: Adult Health

## 2019-06-19 NOTE — Telephone Encounter (Signed)
Please advise 

## 2019-09-16 ENCOUNTER — Encounter: Payer: Self-pay | Admitting: Adult Health

## 2019-10-08 ENCOUNTER — Encounter: Payer: Self-pay | Admitting: Adult Health

## 2019-10-10 ENCOUNTER — Other Ambulatory Visit: Payer: Self-pay | Admitting: Adult Health

## 2019-10-10 MED ORDER — ONDANSETRON HCL 4 MG PO TABS: 4.0000 mg | ORAL_TABLET | Freq: Three times a day (TID) | ORAL | 0 refills | Status: DC | PRN

## 2019-11-08 ENCOUNTER — Encounter: Payer: Self-pay | Admitting: Adult Health

## 2019-11-09 ENCOUNTER — Telehealth: Payer: Medicare Other | Admitting: Adult Health

## 2019-11-20 ENCOUNTER — Other Ambulatory Visit: Payer: Self-pay | Admitting: Adult Health

## 2019-11-20 DIAGNOSIS — F332 Major depressive disorder, recurrent severe without psychotic features: Secondary | ICD-10-CM

## 2019-11-24 ENCOUNTER — Other Ambulatory Visit: Payer: Self-pay | Admitting: Adult Health

## 2019-11-29 ENCOUNTER — Encounter: Payer: Self-pay | Admitting: Adult Health

## 2019-12-06 ENCOUNTER — Other Ambulatory Visit: Payer: Self-pay | Admitting: Adult Health

## 2019-12-07 ENCOUNTER — Other Ambulatory Visit: Payer: Self-pay | Admitting: Adult Health

## 2019-12-07 MED ORDER — VITAMIN D (ERGOCALCIFEROL) 1.25 MG (50000 UNIT) PO CAPS: 50000.0000 [IU] | ORAL_CAPSULE | ORAL | 1 refills | Status: AC

## 2019-12-07 NOTE — Telephone Encounter (Signed)
Sent in

## 2019-12-07 NOTE — Telephone Encounter (Signed)
She should not need a a refill at this time, was just sent in last week

## 2019-12-07 NOTE — Telephone Encounter (Signed)
Pharmacy did not receive the refills.  I have called and confirmed.

## 2020-01-01 ENCOUNTER — Encounter: Payer: Self-pay | Admitting: Adult Health

## 2020-01-01 NOTE — Telephone Encounter (Signed)
FYI

## 2020-01-31 ENCOUNTER — Ambulatory Visit: Payer: Medicare Other | Admitting: Adult Health

## 2020-02-02 ENCOUNTER — Ambulatory Visit (INDEPENDENT_AMBULATORY_CARE_PROVIDER_SITE_OTHER): Payer: Medicare Other | Admitting: Adult Health

## 2020-02-02 ENCOUNTER — Ambulatory Visit: Payer: Medicare Other | Admitting: Adult Health

## 2020-02-02 ENCOUNTER — Encounter: Payer: Self-pay | Admitting: Adult Health

## 2020-02-02 VITALS — BP 122/82 | HR 86 | Temp 99.3°F | Ht 60.06 in | Wt 86.0 lb

## 2020-02-02 DIAGNOSIS — N390 Urinary tract infection, site not specified: Secondary | ICD-10-CM | POA: Diagnosis not present

## 2020-02-02 MED ORDER — PREDNISONE 10 MG PO TABS: ORAL_TABLET | ORAL | 0 refills | Status: DC

## 2020-02-02 MED ORDER — DOXYCYCLINE HYCLATE 100 MG PO CAPS: 100.0000 mg | ORAL_CAPSULE | Freq: Two times a day (BID) | ORAL | 0 refills | Status: DC

## 2020-02-02 NOTE — Patient Instructions (Signed)
I think you may have pneumonia   I have sent in an antibiotics called doxycycline - start this tonight   I have also send in a prednisone taper - start this tomorrow.

## 2020-02-02 NOTE — Progress Notes (Signed)
Subjective:    Patient ID: Jody Moore, female    DOB: Aug 06, 1940, 79 y.o.   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) (12/29/2016), Situational anxiety (07/29/2016), and Traumatic compression fracture of T11 thoracic vertebra, with routine healing, subsequent encounter (12/29/2016).  She presents with her sister today for acute issue of productive cough x3 months.  She reports that she has had a hard time eating due to the cough and mucus production.  She denies feeling feverish at home but has a low-grade temperature here.  She has felt short of breath and wheezy at times.   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   Reflux    Severe episode of recurrent major depressive disorder, without psychotic features ([place]) 07/10/2015   Severe protein-calorie malnutrition ([place]) 12/29/2016   Situational anxiety 07/29/2016   Traumatic compression fracture of T11 thoracic vertebra, with routine healing, subsequent encounter 12/29/2016    Social History   Socioeconomic History   Marital status: Widowed    Spouse name: Not on file     Number of children: 0   Years of education: Not on file   Highest education level: Not on file  Occupational History   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 yes   Marital status:  Married                            What year were you married? 2015   Do you live in a house, apartment, assisted living, condo, trailer, etc)? house   Is it one or more stories? yes   How many persons live in your home? 2                                                                                               Do  you have any pets in your home?  2 cats   Current or past profession: Hospital doctor   Do you exercise?    no                                                 Type & how often:   Do you have a living will?  no   Do you have a DNR Form?  no   Do you have a POA/HPOA forms?  No   Admitted to [facility] 12/25/16      DNR      07/20/18: Lives alone in one-level home, has two cats.   Sister, sylvia, local and assists as needed, but pt. mostly independent with ADLs, IADLs   Relies heavily on sister for social/emotional support   Social Determinants of Health   Financial Resource Strain:    Difficulty of Paying Living Expenses: Not on file  Food Insecurity:    Worried About Charity fundraiser in the Last Year: Not on file   [employer] of Food in the Last Year: Not on file  Transportation Needs:    Lack of Transportation (Medical): Not on file   Lack of Transportation (Non-Medical): Not on file  Physical Activity:    Days of Exercise per Week: Not on  file   Minutes of Exercise per Session: Not on file  Stress:    Feeling of Stress : Not on file  Social Connections:    Frequency of Communication with Friends and Family: Not on file   Frequency of Social Gatherings with Friends and Family: Not on file   Attends Religious Services: Not on file   Active Member of Clubs or Organizations: Not on file   Attends Archivist Meetings: Not on file   Marital Status: Not on file  Intimate Partner Violence:    Fear of Current or Ex-Partner: Not on file   Emotionally Abused: Not on file   Physically Abused: Not on file   Sexually Abused: Not on file    Past Surgical History:  Procedure Laterality Date   ANKLE SURGERY     CATARACT EXTRACTION Bilateral    [place]   cancer-radiation    FEMUR SURGERY  2005   fractured right femur    HIP SURGERY  2006   fractured right hip    IR RADIOLOGIST EVAL & MGMT  01/14/2017   ORIF FEMUR FRACTURE Right 12/22/2016   Procedure: OPEN REDUCTION INTERNAL FIXATION (ORIF) DISTAL FEMUR FRACTURE;  Surgeon: [name], MD;  Location: [facility];  Service: Orthopedics;  Laterality: Right;   TONSILLECTOMY      Family History  Problem Relation Age of Onset   Heart disease Father    Bladder Cancer Father    Diabetes Father     Allergies  Allergen Reactions   Influenza Vaccines Hives    Hives/swelling/injection site war to the touch. No relief with benadryl    Codeine Sulfate Rash    Current Outpatient Medications on File Prior to Visit  Medication Sig Dispense Refill   calcium gluconate 500 MG tablet Take 1 tablet by mouth daily.     doxycycline (VIBRAMYCIN) 100 MG capsule Take 1 capsule (100 mg total) by mouth 2 (two) times daily. 14 capsule 0   ibuprofen (ADVIL,MOTRIN) 200 MG  tablet Take 200 mg by mouth every 6 (six) hours as needed.     Melatonin 3 MG TABS Take 1 tablet by mouth daily.     Multiple Vitamins-Minerals (MULTIVITAMIN WITH MINERALS)  tablet Take 1 tablet by mouth daily.     ondansetron (ZOFRAN) 4 MG tablet Take 1 tablet (4 mg total) by mouth every 8 (eight) hours as needed for nausea or vomiting. 20 tablet 0   PARoxetine (PAXIL) 40 MG tablet TAKE 1 TABLET(40 MG) BY MOUTH DAILY 90 tablet 1   QUEtiapine (SEROQUEL) 100 MG tablet Take 1 tablet (100 mg total) by mouth at bedtime. 90 tablet 3   Vitamin D, Ergocalciferol, (DRISDOL) 1.25 MG (50000 UNIT) CAPS capsule Take 1 capsule (50,000 Units total) by mouth every 7 (seven) days. 12 capsule 1   topiramate (TOPAMAX) 25 MG tablet Take 1 tablet (25 mg total) by mouth 2 (two) times daily. 180 tablet 3   No current facility-administered medications on file prior to visit.    BP 122/82    Pulse 86    Temp 99.3 F (37.4 C) (Other (Comment))    Ht 5' 0.06" (1.526 m)    Wt 86 lb (39 kg)    SpO2 (!) 86%    BMI 16.76 kg/m       Objective:   Physical Exam Vitals and nursing note reviewed.  Constitutional:      Appearance: Normal appearance.  Cardiovascular:     Rate and Rhythm: Normal rate and regular rhythm.     Heart sounds: Normal heart sounds.  Pulmonary:     Effort: Pulmonary effort is normal.     Breath sounds: Wheezing and rhonchi present.  Musculoskeletal:        General: Normal range of motion.  Skin:    General: Skin is warm and dry.     Capillary Refill: Capillary refill takes less than 2 seconds.  Neurological:     Mental Status: She is alert and oriented to person, place, and time.  Psychiatric:        Mood and Affect: Mood normal.        Behavior: Behavior normal.        Thought Content: Thought content normal.        Judgment: Judgment normal.       Assessment & Plan:  1. Lower urinary tract infection -Possible pneumonia, will cover with doxycycline and prednisone.  She was advised to follow-up if no improvement over the next 2 to 3 days - doxycycline (VIBRAMYCIN) 100 MG capsule; Take 1 capsule (100 mg total) by mouth 2 (two) times daily.  Dispense:  20 capsule; Refill: 0 - predniSONE (DELTASONE) 10 MG tablet; 40 mg x 3 days, 20 mg x 3 days, 10 mg x 3 days  Dispense: 21 tablet; Refill: 0

## 2020-02-06 ENCOUNTER — Encounter: Payer: Self-pay | Admitting: Adult Health

## 2020-02-06 ENCOUNTER — Telehealth: Payer: Self-pay | Admitting: Adult Health

## 2020-02-06 NOTE — Telephone Encounter (Signed)
Pt sister called to say pt lost half of her medication predniSONE (DELTASONE) 10 MG tablet   And she has only 4 left. She would like some imput on what to do.  Call [facility] [phone]  Please

## 2020-02-07 NOTE — Telephone Encounter (Signed)
Please advise 

## 2020-02-10 ENCOUNTER — Encounter: Payer: Self-pay | Admitting: Adult Health

## 2020-02-21 ENCOUNTER — Encounter: Payer: Self-pay | Admitting: Adult Health

## 2020-03-16 ENCOUNTER — Encounter: Payer: Self-pay | Admitting: Adult Health

## 2020-03-24 ENCOUNTER — Encounter: Payer: Self-pay | Admitting: Adult Health

## 2020-03-28 ENCOUNTER — Ambulatory Visit (INDEPENDENT_AMBULATORY_CARE_PROVIDER_SITE_OTHER): Payer: Medicare Other | Admitting: Adult Health

## 2020-03-28 ENCOUNTER — Encounter: Payer: Self-pay | Admitting: Adult Health

## 2020-03-28 ENCOUNTER — Other Ambulatory Visit: Payer: Self-pay

## 2020-03-28 VITALS — BP 122/60 | HR 73 | Temp 98.6°F

## 2020-03-28 DIAGNOSIS — G250 Essential tremor: Secondary | ICD-10-CM

## 2020-03-28 MED ORDER — GABAPENTIN 100 MG PO CAPS: 100.0000 mg | ORAL_CAPSULE | Freq: Two times a day (BID) | ORAL | 0 refills | Status: DC

## 2020-03-28 NOTE — Patient Instructions (Signed)
I am going to prescribe you Gabapentin 100 mg twice a day for the tremors. You can also get weighted silverware to help with eating   Please follow up in 3-4 weeks

## 2020-03-28 NOTE — Progress Notes (Signed)
Subjective:    Patient ID: Jody Moore, female    DOB: 11/30/1940, 79 y.o.   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  She presents to the office today for worsening essential tremor.  She is with her sister today.  Both report that they feel as though the essential tremor is getting worse over the last 2 months and that the patient has have been difficulty feeding herself.  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   Reflux    Severe episode of recurrent major depressive disorder, without psychotic features ([place]) 07/10/2015   Severe protein-calorie malnutrition ([place]) 12/29/2016   Situational anxiety 07/29/2016   Traumatic compression fracture of T11 thoracic vertebra, with routine healing,  subsequent encounter 12/29/2016    Social History   Socioeconomic History   Marital status: Widowed    Spouse name: Not on file   Number of children: 0   Years of education: Not on file   Highest education level: Not on file  Occupational History   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 yes   Marital status:  Married                            What year were you married? 2015   Do you live in a house, apartment, assisted living, condo, trailer, etc)? house   Is it one or more stories? yes   How many persons live in your home? 2  Do you have any pets in your home?  2 cats   Current or past profession: Hospital doctor   Do you exercise?    no                                                 Type & how often:   Do you have a living will?  no   Do you have a DNR Form?  no   Do you have a POA/HPOA forms?  No   Admitted to [facility] 12/25/16      DNR      07/20/18: Lives alone in one-level home, has two cats.   Sister, sylvia, local and assists as needed, but pt. mostly independent with ADLs, IADLs   Relies heavily on sister for social/emotional support   Social Determinants of Health   Financial Resource Strain:    Difficulty of Paying Living Expenses: Not on file  Food Insecurity:    Worried About Charity fundraiser in the Last Year: Not on file   [employer] of Food in the Last Year: Not on file  Transportation Needs:    Lack of Transportation  (Medical): Not on file   Lack of Transportation (Non-Medical): Not on file  Physical Activity:    Days of Exercise per Week: Not on file   Minutes of Exercise per Session: Not on file  Stress:    Feeling of Stress : Not on file  Social Connections:    Frequency of Communication with Friends and Family: Not on file   Frequency of Social Gatherings with Friends and Family: Not on file   Attends Religious Services: Not on file   Active Member of Clubs or Organizations: Not on file   Attends Archivist Meetings: Not on file   Marital Status: Not on file  Intimate Partner Violence:    Fear of Current or Ex-Partner: Not on file   Emotionally Abused: Not on file   Physically Abused: Not on file   Sexually Abused: Not on file    Past Surgical History:  Procedure Laterality Date   ANKLE SURGERY     CATARACT EXTRACTION Bilateral    [place]   cancer-radiation    FEMUR SURGERY  2005   fractured right femur    HIP SURGERY  2006   fractured right hip    IR RADIOLOGIST EVAL & MGMT  01/14/2017   ORIF FEMUR FRACTURE Right 12/22/2016   Procedure: OPEN REDUCTION INTERNAL FIXATION (ORIF) DISTAL FEMUR FRACTURE;  Surgeon: [name], MD;  Location: [facility];  Service: Orthopedics;  Laterality: Right;   TONSILLECTOMY      Family History  Problem Relation Age of Onset   Heart disease Father    Bladder Cancer Father    Diabetes Father     Allergies  Allergen Reactions   Influenza Vaccines Hives    Hives/swelling/injection site war to the touch. No relief with benadryl    Codeine Sulfate Rash    Current Outpatient Medications on File Prior to Visit  Medication Sig Dispense Refill   calcium gluconate 500 MG tablet Take 1 tablet by mouth daily.     ibuprofen (ADVIL,MOTRIN) 200 MG tablet Take 200 mg by mouth every 6 (six) hours as needed.     Melatonin 3 MG TABS Take  1 tablet by mouth daily.     Multiple Vitamins-Minerals  (MULTIVITAMIN WITH MINERALS) tablet Take 1 tablet by mouth daily.     ondansetron (ZOFRAN) 4 MG tablet Take 1 tablet (4 mg total) by mouth every 8 (eight) hours as needed for nausea or vomiting. 20 tablet 0   PARoxetine (PAXIL) 40 MG tablet TAKE 1 TABLET(40 MG) BY MOUTH DAILY 90 tablet 1   QUEtiapine (SEROQUEL) 100 MG tablet Take 1 tablet (100 mg total) by mouth at bedtime. 90 tablet 3   Vitamin D, Ergocalciferol, (DRISDOL) 1.25 MG (50000 UNIT) CAPS capsule Take 1 capsule (50,000 Units total) by mouth every 7 (seven) days. 12 capsule 1   No current facility-administered medications on file prior to visit.    BP 122/60 (BP Location: Left Arm, Patient Position: Sitting, Cuff Size: Normal)    Pulse 73    Temp 98.6 F (37 C) (Oral)    SpO2 92%       Objective:   Physical Exam Vitals and nursing note reviewed.  Constitutional:      Appearance: Normal appearance. She is well-developed and underweight.  Cardiovascular:     Rate and Rhythm: Normal rate and regular rhythm.     Pulses: Normal pulses.     Heart sounds: Normal heart sounds.  Pulmonary:     Effort: Pulmonary effort is normal.     Breath sounds: Normal breath sounds.  Abdominal:     Palpations: Abdomen is rigid.  Skin:    General: Skin is warm and dry.     Capillary Refill: Capillary refill takes less than 2 seconds.  Neurological:     General: No focal deficit present.     Mental Status: She is alert and oriented to person, place, and time.     Motor: Tremor present. No weakness.     Comments: Bilateral essential tremor.  No cogwheel.  She does have significant shaking when using a utensil to feed herself using a pen to write a sentence  Psychiatric:        Mood and Affect: Mood normal.        Behavior: Behavior normal.        Thought Content: Thought content normal.        Judgment: Judgment normal.        Assessment & Plan:  1. Essential tremor -Try her on low-dose gabapentin twice a day.  She will follow-up  in 3 to 4 weeks to see how she is doing.  She was also advised to order some weighted silverware off [place] this will give her more stability during feeding - gabapentin (NEURONTIN) 100 MG capsule; Take 1 capsule (100 mg total) by mouth 2 (two) times daily.  Dispense: 60 capsule; Refill: 0  [name], NP

## 2020-03-31 ENCOUNTER — Other Ambulatory Visit: Payer: Self-pay | Admitting: Adult Health

## 2020-03-31 DIAGNOSIS — G250 Essential tremor: Secondary | ICD-10-CM

## 2020-04-02 ENCOUNTER — Telehealth: Payer: Self-pay | Admitting: Adult Health

## 2020-04-02 ENCOUNTER — Other Ambulatory Visit: Payer: Self-pay

## 2020-04-02 ENCOUNTER — Ambulatory Visit (INDEPENDENT_AMBULATORY_CARE_PROVIDER_SITE_OTHER): Payer: Medicare Other

## 2020-04-02 DIAGNOSIS — Z0000 Encounter for general adult medical examination without abnormal findings: Secondary | ICD-10-CM

## 2020-04-02 DIAGNOSIS — R413 Other amnesia: Secondary | ICD-10-CM | POA: Diagnosis not present

## 2020-04-02 NOTE — Progress Notes (Signed)
  Chronic Care Management   Outreach Note  04/02/2020 Name: Jody Moore MRN: [id number] DOB: 31-Mar-1941  Referred by: [name], NP Reason for referral : No chief complaint on file.   An unsuccessful telephone outreach was attempted today. The patient was referred to the pharmacist for assistance with care management and care coordination.   Follow Up Plan:   [name] [employer] Scheduler

## 2020-04-02 NOTE — Progress Notes (Signed)
Virtual Visit via Telephone Note  I connected with  Howell Rucks on 04/02/20 at  8:00 AM EDT by telephone and verified that I am speaking with the correct person using two identifiers.  Medicare Annual Wellness visit completed telephonically due to Covid-19 pandemic.   Persons participating in this call: This Health Coach and this patient.   Location: Patient: Home Provider: Office   I discussed the limitations, risks, security and privacy concerns of performing an evaluation and management service by telephone and the availability of in person appointments. The patient expressed understanding and agreed to proceed.  Unable to perform video visit due to video visit attempted and failed and/or patient does not have video capability.   Some vital signs may be absent or patient reported.   Willette Brace, LPN    Subjective:   Jody Moore is a 79 y.o. female who presents for Medicare Annual (Subsequent) preventive examination.  Review of Systems     Cardiac Risk Factors include: advanced age (>59men, >72 women)     Objective:    There were no vitals filed for this visit. There is no height or weight on file to calculate BMI.  Advanced Directives 04/02/2020 07/20/2018 09/02/2017 02/09/2017 01/14/2017 12/28/2016 12/21/2016  Does Patient Have a Medical Advance Directive? Yes Yes Yes Yes Yes Yes Yes  Type of Paramedic of [place];Living will [place];Living will [place];Out of facility DNR (pink MOST or yellow form) [place];Out of facility DNR (pink MOST or yellow form) [place];Out of facility DNR (pink MOST or yellow form) Out of facility DNR (pink MOST or yellow form) [place];Living will  Does patient want to make changes to medical advance directive? - No - Patient declined - - - - No - Patient declined  Copy of [place] in Chart? Yes - validated most recent copy scanned in chart (See row information) Yes - validated most recent copy scanned in chart (See row information) - Yes - - No - copy requested  Would patient like information on creating a medical advance directive? - - - - - - -  Pre-existing out of facility DNR order (yellow form or pink MOST form) - - Yellow form placed in chart (order not valid for inpatient use);Pink MOST form placed in chart (order not valid for inpatient use) Yellow form placed in chart (order not valid for inpatient use) Yellow form placed in chart (order not valid for inpatient use) Yellow form placed in chart (order not valid for inpatient use) -    Current Medications (verified) Outpatient Encounter Medications as of 04/02/2020  Medication Sig  . calcium gluconate 500 MG tablet Take 1 tablet by mouth daily.  [name] gabapentin (NEURONTIN) 100 MG capsule TAKE 1 CAPSULE(100 MG) BY MOUTH TWICE DAILY  . ibuprofen (ADVIL,MOTRIN) 200 MG tablet Take 200 mg by mouth every 6 (six) hours as needed.  . Melatonin 3 MG TABS Take 1 tablet by mouth daily.  . Multiple Vitamins-Minerals (MULTIVITAMIN WITH MINERALS) tablet Take 1 tablet by mouth daily.  . ondansetron (ZOFRAN) 4 MG tablet Take 1 tablet (4 mg total) by mouth every 8 (eight) hours as needed for nausea or vomiting.  [name] PARoxetine (PAXIL) 40 MG tablet TAKE 1 TABLET(40 MG) BY MOUTH DAILY  . QUEtiapine (SEROQUEL) 100 MG tablet Take 1 tablet (100 mg total) by mouth at bedtime.  . Vitamin D, Ergocalciferol, (DRISDOL) 1.25 MG (  50000 UNIT) CAPS capsule Take 1 capsule (50,000 Units total) by mouth every 7 (seven) days.   No facility-administered encounter medications on file as of 04/02/2020.    Allergies (verified) Influenza vaccines, Omeprazole, and Codeine sulfate   History: Past Medical History:  Diagnosis Date  . Cervical cancer ([place])   . Depression   . Essential tremor 10/04/2013  . Femur fracture ([place]) 12/22/2016  . Femur  fracture, right ([place]) 12/21/2016  . Gastroesophageal reflux disease without esophagitis 07/10/2015  . 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 Osteoporosis 07/10/2015  . 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 Reflux   . Severe episode of recurrent major depressive disorder, without psychotic features ([place]) 07/10/2015  . Severe protein-calorie malnutrition ([place]) 12/29/2016  . Situational anxiety 07/29/2016  . Traumatic compression fracture of T11 thoracic vertebra, with routine healing, subsequent encounter 12/29/2016   Past Surgical History:  Procedure Laterality Date  . ANKLE SURGERY    . CATARACT EXTRACTION Bilateral   . CERVIX SURGERY  1980   cancer-radiation   . FEMUR SURGERY  2005   fractured right femur   . HIP SURGERY  2006   fractured right hip   . IR RADIOLOGIST EVAL & MGMT  01/14/2017  . ORIF FEMUR FRACTURE Right 12/22/2016   Procedure: OPEN REDUCTION INTERNAL FIXATION (ORIF) DISTAL FEMUR FRACTURE;  Surgeon: [name], MD;  Location: [facility];  Service: Orthopedics;  Laterality: Right;  . TONSILLECTOMY     Family History  Problem Relation Age of Onset  . Heart disease Father   . Bladder Cancer Father   . Diabetes Father    Social History   Socioeconomic History  . Marital status: Widowed    Spouse name: Not on file  . Number of children: 0  . Years of education: Not on file  . Highest education level: Not on file  Occupational History  . 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 Smoking status: Former Smoker    Packs/day: 2.00    Types: Cigarettes    Quit date: 12/2016    Years since quitting: 3.3  . Smokeless tobacco: Never Used  Vaping Use  . Vaping Use: Every day  Substance and Sexual Activity  . Alcohol use: No  . Drug use: No  . Sexual activity: Never  Other Topics Concern  . Not on file  Social History Narrative   Diet:   Do you drink/eat things with caffeine? yes   Marital status:  Married                             What year were you married? 2015   Do you live in a house, apartment, assisted living, condo, trailer, etc)? house   Is it one or more stories? yes   How many persons live in your home? 2                                                                                               Do you have any pets  in your home?  2 cats   Current or past profession: Hospital doctor   Do you exercise?    no                                                 Type & how often:   Do you have a living will?  no   Do you have a DNR Form?  no   Do you have a POA/HPOA forms?  No   Admitted to [facility] 12/25/16      DNR      07/20/18: Lives alone in one-level home, has two cats.   Sister, sylvia, local and assists as needed, but pt. mostly independent with ADLs, IADLs   Relies heavily on sister for social/emotional support   Social Determinants of Health   Financial Resource Strain: Low Risk   . Difficulty of Paying Living Expenses: Not hard at all  Food Insecurity: No Food Insecurity  . Worried About Charity fundraiser in the Last Year: Never true  . Ran Out of Food in the Last Year: Never true  Transportation Needs: No Transportation Needs  . Lack of Transportation (Medical): No  . Lack of Transportation (Non-Medical): No  Physical Activity: Inactive  . Days of Exercise per Week: 0 days  . Minutes of Exercise per Session: 0 min  Stress: No Stress Concern Present  . Feeling of Stress : Only a little  Social Connections: Socially Isolated  . Frequency of Communication with Friends and Family: More than three times a week  . Frequency of Social Gatherings with Friends and Family: Once a week  . Attends Religious Services: Never  . Active Member of Clubs or Organizations: No  . Attends Archivist Meetings: Never  . Marital Status: Widowed    Tobacco Counseling Counseling given: Not Answered   Clinical Intake:  Pre-visit preparation completed: Yes  Pain : No/denies  pain     BMI - recorded: 16.76 Nutritional Status: BMI <19  Underweight Nutritional Risks: None Diabetes: No  How often do you need to have someone help you when you read instructions, pamphlets, or other written materials from your doctor or pharmacy?: 1 - Never  Diabetic?No  Interpreter Needed?: No  Information entered by :: Charlott Rakes, LPN   Activities of Daily Living In your present state of health, do you have any difficulty performing the following activities: 04/02/2020  Hearing? N  Vision? N  Difficulty concentrating or making decisions? N  Walking or climbing stairs? N  Dressing or bathing? N  Doing errands, shopping? N  Preparing Food and eating ? N  Using the Toilet? N  In the past six months, have you accidently leaked urine? N  Do you have problems with loss of bowel control? N  Managing your Medications? N  Managing your Finances? N  Housekeeping or managing your Housekeeping? N  Some recent data might be hidden    Patient Care Team: [name], NP as PCP - General (Family Medicine)  Indicate any recent Medical Services you may have received from other than [facility] providers in the past year (date may be approximate).     Assessment:   This is a routine wellness examination for [place].  Hearing/Vision screen  Hearing Screening   125Hz  250Hz  500Hz  1000Hz  2000Hz  3000Hz  4000Hz  6000Hz  8000Hz   Right ear:           Left ear:           Comments: Pt denies any difficulty hearing  Vision Screening Comments: Pt couldn't remember last appt or Provider  Dietary issues and exercise activities discussed: Current Exercise Habits: The patient does not participate in regular exercise at present  Goals    . Patient Stated     Cut down on vaping to see if diarrhea improves; keep mentally and physically active.  No falls!!    . Patient Stated     To be able to walk without a walker      Depression Screen PHQ 2/9 Scores 04/02/2020 07/20/2018 06/03/2017  10/09/2015  PHQ - 2 Score 0 0 4 0  PHQ- 9 Score - 1 8 -    Fall Risk Fall Risk  04/02/2020 07/20/2018 06/03/2017 07/29/2016 10/09/2015  Falls in the past year? 0 0 Yes No No  Number falls in past yr: 0 - 1 - -  Injury with Fall? 0 - - - -  Risk for fall due to : Impaired balance/gait;Impaired mobility;Impaired vision History of fall(s);Impaired balance/gait;Impaired vision;Impaired mobility - - -  Follow up Falls prevention discussed Education provided;Falls prevention discussed - - -    Any stairs in or around the home? No  If so, are there any without handrails? No  Home free of loose throw rugs in walkways, pet beds, electrical cords, etc? Yes  Adequate lighting in your home to reduce risk of falls? Yes   ASSISTIVE DEVICES UTILIZED TO PREVENT FALLS:  Life alert? No  Use of a cane, walker or w/c? Yes  Grab bars in the bathroom? Yes  Shower chair or bench in shower? Yes  Elevated toilet seat or a handicapped toilet? Yes   TIMED UP AND GO:  Was the test performed? No .      Cognitive Function: MMSE - Mini Mental State Exam 10/09/2015  Orientation to time 5  Orientation to Place 5  Registration 3  Attention/ Calculation 5  Recall 3  Language- name 2 objects 2  Language- repeat 1  Language- follow 3 step command 3  Language- read & follow direction 1  Write a sentence 1  Copy design 1  Total score 30     6CIT Screen 04/02/2020  What Year? 0 points  What month? 0 points  Count back from 20 4 points  Months in reverse 4 points  Repeat phrase 10 points    Immunizations Immunization History  Administered Date(s) Administered  . Influenza,inj,Quad PF,6+ Mos 07/10/2015  . PFIZER SARS-COV-2 Vaccination 08/11/2019, 09/11/2019, 03/22/2020  . PPD Test 12/25/2016  . Pneumococcal Polysaccharide-23 07/10/2015  . Tdap 12/21/2016    TDAP status: Up to date Flu Vaccine status: Declined, Education has been provided regarding the importance of this vaccine but patient still  declined. Advised may receive this vaccine at local pharmacy or Health Dept. Aware to provide a copy of the vaccination record if obtained from local pharmacy or Health Dept. Verbalized acceptance and understanding. Pneumococcal vaccine status: Declined,  Education has been provided regarding the importance of this vaccine but patient still declined. Advised may receive this vaccine at local pharmacy or Health Dept. Aware to provide a copy of the vaccination record if obtained from local pharmacy or Health Dept. Verbalized acceptance and understanding.  Covid-19 vaccine status: Completed vaccines  Qualifies for Shingles Vaccine? Yes   Zostavax completed No   Shingrix Completed?: No.  Education has been provided regarding the importance of this vaccine. Patient has been advised to call insurance company to determine out of pocket expense if they have not yet received this vaccine. Advised may also receive vaccine at local pharmacy or Health Dept. Verbalized acceptance and understanding.  Screening Tests Health Maintenance  Topic Date Due  . PNA vac Low Risk Adult (2 of 2 - PCV13) 07/09/2016  . TETANUS/TDAP  12/22/2026  . DEXA SCAN  Completed  . COVID-19 Vaccine  Completed  . Hepatitis C Screening  Completed    Health Maintenance  Health Maintenance Due  Topic Date Due  . PNA vac Low Risk Adult (2 of 2 - PCV13) 07/09/2016    Colorectal cancer screening: Completed 10/09/14. Repeat every 0 years Mammogram status: Completed 09/02/15. Repeat every year Bone Density status: Completed 09/02/15. Results reflect: Bone density results: OSTEOPOROSIS. Repeat every 2 years.   Additional Screening:  Hepatitis C Screening: Completed 12/22/16  Vision Screening: Recommended annual ophthalmology exams for early detection of glaucoma and other disorders of the eye. Is the patient up to date with their annual eye exam?  No  Who is the provider or what is the name of the office in which the patient  attends annual eye exams? Unable to recollect appt or provider   Dental Screening: Recommended annual dental exams for proper oral hygiene  Community Resource Referral / Chronic Care Management: CRR required this visit?  No   CCM required this visit? yes     Plan:     I have personally reviewed and noted the following in the patient's chart:   . Medical and social history . Use of alcohol, tobacco or illicit drugs  . Current medications and supplements . Functional ability and status . Nutritional status . Physical activity . Advanced directives . List of other physicians . Hospitalizations, surgeries, and ER visits in previous 12 months . Vitals . Screenings to include cognitive, depression, and falls . Referrals and appointments  In addition, I have reviewed and discussed with patient certain preventive protocols, quality metrics, and best practice recommendations. A written personalized care plan for preventive services as well as general preventive health recommendations were provided to patient.     Willette Brace, LPN   [id number]/[id number]   Nurse Notes: pt lives alone may benefit from social work doing an assessment to see if they are PT needs or personal care assistance. Pt memory may be a challenge in some areas

## 2020-04-02 NOTE — Patient Instructions (Signed)
Jody Moore , Thank you for taking time to come for your Medicare Wellness Visit. I appreciate your ongoing commitment to your health goals. Please review the following plan we discussed and let me know if I can assist you in the future.   Screening recommendations/referrals: Colonoscopy: No longer required Mammogram: no longer required Bone Density: Done 09/02/15 Recommended yearly ophthalmology/optometry visit for glaucoma screening and checkup Recommended yearly dental visit for hygiene and checkup  Vaccinations: Influenza vaccine: Pt has allergy listed  Pneumococcal vaccine: due and discussed  Tdap vaccine: Up to date Shingles vaccine: Shingrix discussed. Please contact your pharmacy for coverage information.    Covid-19:Completed 3/5, 4/5, and 03/22/20  Advanced directives: Copies in chart   Conditions/risks identified: to be able to walk without walker   Next appointment: Follow up in one year for your annual wellness visit    Preventive Care 65 Years and Older, Female Preventive care refers to lifestyle choices and visits with your health care provider that can promote health and wellness. What does preventive care include?  A yearly physical exam. This is also called an annual well check.  Dental exams once or twice a year.  Routine eye exams. Ask your health care provider how often you should have your eyes checked.  Personal lifestyle choices, including:  Daily care of your teeth and gums.  Regular physical activity.  Eating a healthy diet.  Avoiding tobacco and drug use.  Limiting alcohol use.  Practicing safe sex.  Taking low-dose aspirin every day.  Taking vitamin and mineral supplements as recommended by your health care provider. What happens during an annual well check? The services and screenings done by your health care provider during your annual well check will depend on your age, overall health, lifestyle risk factors, and family history of  disease. Counseling  Your health care provider may ask you questions about your:  Alcohol use.  Tobacco use.  Drug use.  Emotional well-being.  Home and relationship well-being.  Sexual activity.  Eating habits.  History of falls.  Memory and ability to understand (cognition).  Work and work Statistician.  Reproductive health. Screening  You may have the following tests or measurements:  Height, weight, and BMI.  Blood pressure.  Lipid and cholesterol levels. These may be checked every 5 years, or more frequently if you are over 40 years old.  Skin check.  Lung cancer screening. You may have this screening every year starting at age 52 if you have a 30-pack-year history of smoking and currently smoke or have quit within the past 15 years.  Fecal occult blood test (FOBT) of the stool. You may have this test every year starting at age 25.  Flexible sigmoidoscopy or colonoscopy. You may have a sigmoidoscopy every 5 years or a colonoscopy every 10 years starting at age 36.  Hepatitis C blood test.  Hepatitis B blood test.  Sexually transmitted disease (STD) testing.  Diabetes screening. This is done by checking your blood sugar (glucose) after you have not eaten for a while (fasting). You may have this done every 1-3 years.  Bone density scan. This is done to screen for osteoporosis. You may have this done starting at age 75.  Mammogram. This may be done every 1-2 years. Talk to your health care provider about how often you should have regular mammograms. Talk with your health care provider about your test results, treatment options, and if necessary, the need for more tests. Vaccines  Your health care provider may  recommend certain vaccines, such as:  Influenza vaccine. This is recommended every year.  Tetanus, diphtheria, and acellular pertussis (Tdap, Td) vaccine. You may need a Td booster every 10 years.  Zoster vaccine. You may need this after age  34.  Pneumococcal 13-valent conjugate (PCV13) vaccine. One dose is recommended after age 80.  Pneumococcal polysaccharide (PPSV23) vaccine. One dose is recommended after age 9. Talk to your health care provider about which screenings and vaccines you need and how often you need them. This information is not intended to replace advice given to you by your health care provider. Make sure you discuss any questions you have with your health care provider. Document Released: 06/21/2015 Document Revised: 02/12/2016 Document Reviewed: 03/26/2015 Elsevier Interactive Patient Education  2017 [place] Prevention in the Home Falls can cause injuries. They can happen to people of all ages. There are many things you can do to make your home safe and to help prevent falls. What can I do on the outside of my home?  Regularly fix the edges of walkways and driveways and fix any cracks.  Remove anything that might make you trip as you walk through a door, such as a raised step or threshold.  Trim any bushes or trees on the path to your home.  Use bright outdoor lighting.  Clear any walking paths of anything that might make someone trip, such as rocks or tools.  Regularly check to see if handrails are loose or broken. Make sure that both sides of any steps have handrails.  Any raised decks and porches should have guardrails on the edges.  Have any leaves, snow, or ice cleared regularly.  Use sand or salt on walking paths during winter.  Clean up any spills in your garage right away. This includes oil or grease spills. What can I do in the bathroom?  Use night lights.  Install grab bars by the toilet and in the tub and shower. Do not use towel bars as grab bars.  Use non-skid mats or decals in the tub or shower.  If you need to sit down in the shower, use a plastic, non-slip stool.  Keep the floor dry. Clean up any water that spills on the floor as soon as it happens.  Remove  soap buildup in the tub or shower regularly.  Attach bath mats securely with double-sided non-slip rug tape.  Do not have throw rugs and other things on the floor that can make you trip. What can I do in the bedroom?  Use night lights.  Make sure that you have a light by your bed that is easy to reach.  Do not use any sheets or blankets that are too big for your bed. They should not hang down onto the floor.  Have a firm chair that has side arms. You can use this for support while you get dressed.  Do not have throw rugs and other things on the floor that can make you trip. What can I do in the kitchen?  Clean up any spills right away.  Avoid walking on wet floors.  Keep items that you use a lot in easy-to-reach places.  If you need to reach something above you, use a strong step stool that has a grab bar.  Keep electrical cords out of the way.  Do not use floor polish or wax that makes floors slippery. If you must use wax, use non-skid floor wax.  Do not have throw rugs and  other things on the floor that can make you trip. What can I do with my stairs?  Do not leave any items on the stairs.  Make sure that there are handrails on both sides of the stairs and use them. Fix handrails that are broken or loose. Make sure that handrails are as long as the stairways.  Check any carpeting to make sure that it is firmly attached to the stairs. Fix any carpet that is loose or worn.  Avoid having throw rugs at the top or bottom of the stairs. If you do have throw rugs, attach them to the floor with carpet tape.  Make sure that you have a light switch at the top of the stairs and the bottom of the stairs. If you do not have them, ask someone to add them for you. What else can I do to help prevent falls?  Wear shoes that:  Do not have high heels.  Have rubber bottoms.  Are comfortable and fit you well.  Are closed at the toe. Do not wear sandals.  If you use a  stepladder:  Make sure that it is fully opened. Do not climb a closed stepladder.  Make sure that both sides of the stepladder are locked into place.  Ask someone to hold it for you, if possible.  Clearly mark and make sure that you can see:  Any grab bars or handrails.  First and last steps.  Where the edge of each step is.  Use tools that help you move around (mobility aids) if they are needed. These include:  Canes.  Walkers.  Scooters.  Crutches.  Turn on the lights when you go into a dark area. Replace any light bulbs as soon as they burn out.  Set up your furniture so you have a clear path. Avoid moving your furniture around.  If any of your floors are uneven, fix them.  If there are any pets around you, be aware of where they are.  Review your medicines with your doctor. Some medicines can make you feel dizzy. This can increase your chance of falling. Ask your doctor what other things that you can do to help prevent falls. This information is not intended to replace advice given to you by your health care provider. Make sure you discuss any questions you have with your health care provider. Document Released: 03/21/2009 Document Revised: 10/31/2015 Document Reviewed: 06/29/2014 Elsevier Interactive Patient Education  2017 [employer].

## 2020-04-03 ENCOUNTER — Telehealth: Payer: Self-pay | Admitting: Adult Health

## 2020-04-03 NOTE — Progress Notes (Signed)
  Chronic Care Management   Note  04/03/2020 Name: Jody Moore MRN: [id number] DOB: 1940/12/14  Jody Moore is a 79 y.o. year old female who is a primary care patient of [name], NP. I reached out to Howell Rucks by phone today in response to a referral sent by [name]'s PCP, [name], NP.   [name] was given information about Chronic Care Management services today including:  1. CCM service includes personalized support from designated clinical staff supervised by her physician, including individualized plan of care and coordination with other care providers 2. 24/7 contact phone numbers for assistance for urgent and routine care needs. 3. Service will only be billed when office clinical staff spend 20 minutes or more in a month to coordinate care. 4. Only one practitioner may furnish and bill the service in a calendar month. 5. The patient may stop CCM services at any time (effective at the end of the month) by phone call to the office staff.   Patient agreed to services and verbal consent obtained.   Follow up plan:   [name] [employer] Scheduler

## 2020-04-05 ENCOUNTER — Encounter: Payer: Self-pay | Admitting: Adult Health

## 2020-04-24 ENCOUNTER — Ambulatory Visit (INDEPENDENT_AMBULATORY_CARE_PROVIDER_SITE_OTHER): Payer: Medicare Other | Admitting: Adult Health

## 2020-04-24 ENCOUNTER — Other Ambulatory Visit: Payer: Self-pay

## 2020-04-24 ENCOUNTER — Encounter: Payer: Self-pay | Admitting: Adult Health

## 2020-04-24 DIAGNOSIS — G250 Essential tremor: Secondary | ICD-10-CM | POA: Diagnosis not present

## 2020-04-24 MED ORDER — GABAPENTIN 100 MG PO CAPS: 100.0000 mg | ORAL_CAPSULE | Freq: Two times a day (BID) | ORAL | 1 refills | Status: DC

## 2020-04-24 NOTE — Progress Notes (Signed)
Subjective:    Patient ID: Jody Moore, female    DOB: 01/29/1941, 79 y.o.   MRN: [id number]  HPI She presents to the office today for one month follow up regarding essential tremor. When she was last seen she reported that her tremor was getting worse over a two month period and she was having difficulty feeing herself.   She was started on Gabapentin 100 mg BID as initial therapy.   Today she reports that since starting the gabapentin that her essential tremor has greatly improved. She is very happy with the results.      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   Reflux    Severe episode of recurrent major depressive disorder, without psychotic features ([place]) 07/10/2015   Severe protein-calorie malnutrition ([place]) 12/29/2016   Situational anxiety 07/29/2016   Traumatic compression fracture of T11 thoracic vertebra, with routine healing, subsequent encounter 12/29/2016    Social History   Socioeconomic History   Marital status: Widowed    Spouse name: Not on file   Number of children: 0   Years of education: Not on file   Highest education level: Not on file  Occupational History   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 yes   Marital status:  Married                            What year were you married? 2015   Do you live in a house, apartment, assisted living, condo, trailer, etc)? house   Is it one or more stories? yes   How many persons live in your home? 2                                                                                               Do you have any pets in your home?  2 cats   Current or past profession: Hospital doctor   Do you exercise?    no  Type & how often:   Do you have a living will?  no   Do you have a DNR Form?  no   Do you have a POA/HPOA forms?  No   Admitted to [facility] 12/25/16      DNR      07/20/18: Lives alone in one-level home, has two cats.   Sister, sylvia, local and assists as needed, but pt. mostly independent with ADLs, IADLs   Relies heavily on sister for social/emotional support   Social Determinants of Health   Financial Resource Strain: Low Risk    Difficulty of Paying Living Expenses: Not hard at all  Food Insecurity: No Food Insecurity   Worried About Charity fundraiser in the Last Year: Never true   Ran Out of Food in the Last Year: Never true  Transportation Needs: No Transportation Needs   Lack of Transportation (Medical): No   Lack of Transportation (Non-Medical): No  Physical Activity: Inactive   Days of Exercise per Week: 0 days   Minutes of Exercise per Session: 0 min  Stress: No Stress Concern Present   Feeling of Stress : Only a little  Social Connections: Socially Isolated   Frequency of Communication with Friends and Family: More than three times a week   Frequency of Social Gatherings with Friends and Family: Once a week   Attends Religious Services: Never   Marine scientist or Organizations: No   Attends Archivist Meetings: Never   Marital Status: Widowed  Human resources officer Violence: Not At  Risk   Fear of Current or Ex-Partner: No   Emotionally Abused: No   Physically Abused: No   Sexually Abused: No    Past Surgical History:  Procedure Laterality Date   ANKLE SURGERY     CATARACT EXTRACTION Bilateral    CERVIX SURGERY  1980   cancer-radiation    FEMUR SURGERY  2005   fractured right femur    HIP SURGERY  2006   fractured right hip    IR RADIOLOGIST EVAL & MGMT  01/14/2017   ORIF FEMUR FRACTURE Right 12/22/2016   Procedure: OPEN REDUCTION INTERNAL FIXATION (ORIF) DISTAL FEMUR FRACTURE;  Surgeon: [name], MD;  Location: [facility];  Service: Orthopedics;  Laterality: Right;   TONSILLECTOMY      Family History  Problem Relation Age of Onset   Heart disease Father    Bladder Cancer Father    Diabetes Father     Allergies  Allergen Reactions   Influenza Vaccines Hives    Hives/swelling/injection site war to the touch. No relief with benadryl    Omeprazole Hives and Other (See Comments)   Codeine Sulfate Rash    Current Outpatient Medications on File Prior to Visit  Medication Sig Dispense Refill   ibuprofen (ADVIL,MOTRIN) 200 MG tablet Take 200 mg by mouth every 6 (six) hours as needed.     ondansetron (ZOFRAN) 4 MG tablet Take 1 tablet (4 mg total) by mouth every 8 (eight) hours as needed for nausea or vomiting. 20 tablet 0   PARoxetine (PAXIL) 40 MG tablet TAKE 1 TABLET(40 MG) BY MOUTH DAILY 90 tablet 1   QUEtiapine (SEROQUEL) 100 MG tablet Take 1 tablet (100 mg total) by mouth at bedtime. 90 tablet 3   Vitamin D, Ergocalciferol, (DRISDOL) 1.25 MG (50000 UNIT) CAPS capsule Take 1 capsule (50,000 Units total) by mouth every 7 (seven) days. 12 capsule 1   No current facility-administered medications  on file prior to visit.    BP 118/68 (BP Location: Left Arm, Patient Position: Sitting)    Pulse 61    Temp 98.5 F (36.9 C)    Ht 5' 0.08" (1.526 m)    Wt 85 lb 6.4 oz (38.7 kg)    SpO2 (!) 61%    BMI 16.63 kg/m          Objective:   Physical Exam Vitals and nursing note reviewed.  Constitutional:      Appearance: Normal appearance.  Skin:    General: Skin is warm and dry.     Capillary Refill: Capillary refill takes less than 2 seconds.  Neurological:     General: No focal deficit present.     Mental Status: She is alert and oriented to person, place, and time.     Motor: Tremor present.     Comments: Very mild essential tremor bilaterally but mostly resolved        Assessment & Plan:  1. Essential tremor - Continue with gabapentin 100 mg BID - her essential tremor is nearly resolved - gabapentin (NEURONTIN) 100 MG capsule; Take 1 capsule (100 mg total) by mouth 2 (two) times daily.  Dispense: 180 capsule; Refill: 1   [name], NP

## 2020-04-25 ENCOUNTER — Telehealth: Payer: Self-pay | Admitting: Pharmacist

## 2020-04-25 NOTE — Chronic Care Management (AMB) (Signed)
I spoke with the patient's sister Jody Moore) on HIPPA. She canceled the appointment for 04/29/20 @ 2:00 pm. She states she will call back and make another appointment.    Maia Breslow, [place] Assistant [phone]

## 2020-04-29 ENCOUNTER — Telehealth: Payer: Self-pay | Admitting: Adult Health

## 2020-04-29 ENCOUNTER — Telehealth: Payer: Medicare Other

## 2020-04-29 NOTE — Progress Notes (Signed)
  Chronic Care Management   Outreach Note  04/29/2020 Name: Jody Moore MRN: [id number] DOB: [birth year]  Referred by: [name], NP Reason for referral : No chief complaint on file.   An unsuccessful telephone outreach was attempted today. The patient was referred to the pharmacist for assistance with care management and care coordination.   Follow Up Plan:   [name] [employer] Scheduler

## 2020-04-30 ENCOUNTER — Telehealth: Payer: Self-pay | Admitting: Adult Health

## 2020-04-30 NOTE — Progress Notes (Signed)
  Chronic Care Management   Outreach Note  04/30/2020 Name: Jody Moore MRN: [id number] DOB: 24-Jul-1940  Referred by: [name], NP Reason for referral : No chief complaint on file.   A second unsuccessful telephone outreach was attempted today. The patient was referred to pharmacist for assistance with care management and care coordination.  Follow Up Plan:   [name] [employer] Scheduler

## 2020-05-01 ENCOUNTER — Telehealth: Payer: Self-pay | Admitting: Adult Health

## 2020-05-01 NOTE — Progress Notes (Signed)
  Chronic Care Management   Outreach Note  05/01/2020 Name: Jody Moore MRN: [id number] DOB: [birth year]  Referred by: [name], NP Reason for referral : No chief complaint on file.   Third unsuccessful telephone outreach was attempted today. The patient was referred to the pharmacist for assistance with care management and care coordination.   Follow Up Plan:   [name] [employer] Scheduler

## 2020-06-06 ENCOUNTER — Other Ambulatory Visit: Payer: Self-pay | Admitting: Adult Health

## 2020-06-06 DIAGNOSIS — F22 Delusional disorders: Secondary | ICD-10-CM

## 2020-06-07 ENCOUNTER — Encounter: Payer: Self-pay | Admitting: Adult Health

## 2020-06-08 ENCOUNTER — Encounter: Payer: Self-pay | Admitting: Adult Health

## 2020-06-11 ENCOUNTER — Other Ambulatory Visit: Payer: Self-pay | Admitting: Adult Health

## 2020-06-11 DIAGNOSIS — F332 Major depressive disorder, recurrent severe without psychotic features: Secondary | ICD-10-CM

## 2020-06-12 NOTE — Telephone Encounter (Signed)
Spoke to Jody Moore.  She is unable to bring Jody Moore in for a lab visit for the vitamin D.  She has been notified to schedule an appointment.  When Jody Moore receives the results he will decide if the medication can be filled.  Nothing further needed.

## 2020-06-12 NOTE — Telephone Encounter (Signed)
No recent Vit D level.  Please advise.

## 2020-06-12 NOTE — Telephone Encounter (Signed)
Ok for Paxil for 6 months  I would like to get some updated labs on her before we continue high dose vitamin D

## 2020-06-18 ENCOUNTER — Encounter: Payer: Self-pay | Admitting: Adult Health

## 2020-07-20 ENCOUNTER — Encounter: Payer: Self-pay | Admitting: Adult Health

## 2020-08-02 ENCOUNTER — Encounter: Payer: Self-pay | Admitting: Adult Health

## 2020-08-04 ENCOUNTER — Other Ambulatory Visit: Payer: Self-pay

## 2020-08-04 ENCOUNTER — Encounter (HOSPITAL_COMMUNITY): Payer: Self-pay

## 2020-08-04 ENCOUNTER — Ambulatory Visit (HOSPITAL_COMMUNITY)
Admission: RE | Admit: 2020-08-04 | Discharge: 2020-08-04 | Disposition: A | Discharge status: Home / Self Care | Payer: Medicare Other | Source: Ambulatory Visit

## 2020-08-04 VITALS — BP 121/75 | HR 91 | Temp 99.1°F | Resp 18 | Ht 62.0 in | Wt 85.0 lb

## 2020-08-04 DIAGNOSIS — R0602 Shortness of breath: Secondary | ICD-10-CM

## 2020-08-04 DIAGNOSIS — R0902 Hypoxemia: Secondary | ICD-10-CM | POA: Diagnosis not present

## 2020-08-04 NOTE — ED Triage Notes (Signed)
Pt c/o of cough x 2 weeks, nonproductive, denies fever.  Brought in by sister.

## 2020-08-04 NOTE — Discharge Instructions (Signed)
Please had straight to [facility], ER for further evaluation of low oxygen and shortness of breath.  If you experience dizziness, chest pain, worsening shortness of breath, pain in left arm, loss of consciousness, etc. while going to the ER-stop and call 911 immediately.  Please do head straight to the ER today.  If you do not do this, your shortness of breath could progress at home and lead to emergent situation.  This could even result in a heart attack or stroke, and you could die.

## 2020-08-04 NOTE — ED Provider Notes (Signed)
[facility]    CSN: [id number] Arrival date & time: 08/04/20  1147      History   Chief Complaint Chief Complaint  Patient presents with  . Cough    HPI Jody Moore is a 80 y.o. female presenting for shortness of breath.  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  Today presenting with cough for 2 weeks, nonproductive, without fever.  Denies uri symptoms- Denies fevers/chills, n/v/d, shortness of breath, chest pain, cough, congestion, facial pain, teeth pain, headaches, sore throat, loss of taste/smell, swollen lymph nodes, ear pain. Denies chest pain, dizziness. In reviewing charts, she has lost about 50 pounds over the last 3 years.  Pulse ox typically ranges in low 90s to upper 80s, but never this low. She is here today with her sister.  HPI  Past Medical History:  Diagnosis Date  . Cervical cancer ([place])   . Depression   . Essential tremor 10/04/2013  . Femur fracture ([place]) 12/22/2016  . Femur fracture, right ([place]) 12/21/2016  . Gastroesophageal reflux disease without esophagitis 07/10/2015  . 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 Osteoporosis 07/10/2015  . 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 Reflux   . Severe episode of recurrent major depressive disorder, without psychotic features ([place]) 07/10/2015  . Severe protein-calorie malnutrition ([place]) 12/29/2016  . Situational anxiety 07/29/2016  . Traumatic compression fracture of T11 thoracic vertebra, with routine healing, subsequent encounter 12/29/2016    Patient Active Problem List   Diagnosis Date Noted  . Depression 02/16/2017  . Closed fracture of lower end of right femur with routine healing 01/20/2017  . Traumatic compression fracture of T11 thoracic vertebra, with routine healing, subsequent encounter 12/29/2016  .  Acute blood loss as cause of postoperative anemia 12/29/2016  . Severe protein-calorie malnutrition ([place]) 12/29/2016  . Allergic drug rash due to anti-infective agent 12/29/2016  . Femur fracture ([place]) 12/22/2016  . Femur fracture, right ([place]) 12/21/2016  . Prediabetes 07/29/2016  . Situational anxiety 07/29/2016  . Gastroesophageal reflux disease without esophagitis 07/10/2015  . Severe episode of recurrent major depressive disorder, without psychotic features ([place]) 07/10/2015  . Osteoporosis 07/10/2015  . 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 Essential tremor 10/04/2013  . 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 ANKLE SURGERY    . CATARACT EXTRACTION Bilateral   . CERVIX SURGERY  1980   cancer-radiation   . FEMUR SURGERY  2005   fractured right femur   . HIP SURGERY  2006   fractured right hip   . IR RADIOLOGIST EVAL & MGMT  01/14/2017  . ORIF FEMUR FRACTURE Right 12/22/2016   Procedure: OPEN REDUCTION INTERNAL FIXATION (ORIF) DISTAL FEMUR FRACTURE;  Surgeon: [name], MD;  Location: [facility];  Service: Orthopedics;  Laterality: Right;  . TONSILLECTOMY      OB History   No obstetric history on file.      Home Medications    Prior to Admission medications   Medication Sig Start Date End Date Taking? Authorizing Provider  OLANZapine (ZYPREXA) 10 MG tablet Take 10 mg by mouth at bedtime. 07/20/20  Yes [provider]  PARoxetine (PAXIL) 40 MG tablet TAKE 1 TABLET(40 MG) BY MOUTH DAILY 06/12/20  Yes Nafziger, Tommi Rumps, NP  QUEtiapine (SEROQUEL) 100 MG tablet TAKE 1 TABLET(100 MG) BY MOUTH AT BEDTIME  06/11/20  Yes Nafziger, Tommi Rumps, NP  Vitamin D, Ergocalciferol, (DRISDOL) 1.25 MG (50000 UNIT) CAPS capsule Take 1 capsule (50,000 Units total) by mouth every 7 (seven) days. 12/07/19  Yes Nafziger, Tommi Rumps, NP  gabapentin (NEURONTIN) 100 MG capsule Take 1 capsule (100 mg total) by mouth 2 (two) times daily.  04/24/20 07/23/20  Nafziger, Tommi Rumps, NP  ibuprofen (ADVIL,MOTRIN) 200 MG tablet Take 200 mg by mouth every 6 (six) hours as needed.    [provider]  ondansetron (ZOFRAN) 4 MG tablet Take 1 tablet (4 mg total) by mouth every 8 (eight) hours as needed for nausea or vomiting. 10/10/19   [name], NP    Family History Family History  Problem Relation Age of Onset  . Heart disease Father   . Bladder Cancer Father   . Diabetes Father     Social History Social History   Tobacco Use  . Smoking status: Former Smoker    Packs/day: 2.00    Types: Cigarettes    Quit date: 12/2016    Years since quitting: 3.6  . Smokeless tobacco: Never Used  Vaping Use  . Vaping Use: Every day  Substance Use Topics  . Alcohol use: No  . Drug use: No     Allergies   Influenza vaccines, Omeprazole, and Codeine sulfate   Review of Systems Review of Systems  Constitutional: Negative for appetite change, chills and fever.  HENT: Negative for congestion, ear pain, rhinorrhea, sinus pressure, sinus pain and sore throat.   Eyes: Negative for redness and visual disturbance.  Respiratory: Positive for shortness of breath. Negative for cough, chest tightness and wheezing.   Cardiovascular: Negative for chest pain and palpitations.  Gastrointestinal: Negative for abdominal pain, constipation, diarrhea, nausea and vomiting.  Genitourinary: Negative for dysuria, frequency and urgency.  Musculoskeletal: Negative for myalgias.  Neurological: Negative for dizziness, weakness and headaches.  Psychiatric/Behavioral: Negative for confusion.  All other systems reviewed and are negative.    Physical Exam Triage Vital Signs ED Triage Vitals  Enc Vitals Group     BP 08/04/20 1233 121/75     Pulse Rate 08/04/20 1233 91     Resp 08/04/20 1233 18     Temp 08/04/20 1233 99.1 F (37.3 C)     Temp Source 08/04/20 1233 Oral     SpO2 08/04/20 1238 (!) 80 %     Weight 08/04/20 1229 85 lb (38.6 kg)      Height 08/04/20 1229 5\' 2"  (1.575 m)     Head Circumference --      Peak Flow --      Pain Score 08/04/20 1228 0     Pain Loc --      Pain Edu? --      Excl. in [place]? --    No data found.  Updated Vital Signs BP 121/75 (BP Location: Left Arm)   Pulse 91   Temp 99.1 F (37.3 C) (Oral)   Resp 18   Ht 5\' 2"  (1.575 m)   Wt 85 lb (38.6 kg)   SpO2 (!) 84%   BMI 15.55 kg/m   Visual Acuity Right Eye Distance:   Left Eye Distance:   Bilateral Distance:    Right Eye Near:   Left Eye Near:    Bilateral Near:     Physical Exam Vitals reviewed.  Constitutional:      Appearance: She is cachectic.  Cardiovascular:     Rate and Rhythm: Normal rate and regular rhythm.  Heart sounds: Normal heart sounds.  Pulmonary:     Effort: Accessory muscle usage present. No prolonged expiration.     Breath sounds: Wheezing and rales present. No decreased breath sounds or rhonchi.     Comments: Scattered wheezes and rales lower lung fields. Abdominal:     Palpations: Abdomen is soft.     Tenderness: There is no abdominal tenderness. There is no guarding or rebound.  Neurological:     General: No focal deficit present.     Mental Status: She is alert and oriented to person, place, and time.  Psychiatric:        Mood and Affect: Mood normal.        Behavior: Behavior normal.        Thought Content: Thought content normal.      UC Treatments / Results  Labs (all labs ordered are listed, but only abnormal results are displayed) Labs Reviewed - No data to display  EKG   Radiology No results found.  Procedures Procedures (including critical care time)  Medications Ordered in UC Medications - No data to display  Initial Impression / Assessment and Plan / UC Course  I have reviewed the triage vital signs and the nursing notes.  Pertinent labs & imaging results that were available during my care of the patient were reviewed by me and considered in my medical decision making (see  chart for details).      This patient is a 80 year old female presenting with hypoxia today. She does not have a history of respiratory failure or pulmonary disease. She appears short of breath, and also appears cachectic.  In reviewing charts, she has lost about 50 pounds over the last 3 years, though weight has been stable for about 1 year.  Pulse ox typically ranges in low 90s to upper 80s, but never this low.   I am sending this patient to [facility], ER for further evaluation of shortness of breath.  Discussed option of transfer via EMS; which they declined today.  Patient is hemodynamically stable for transport in personal vehicle.  They understand that if she does not head to the ER, she could die.  Will defer imaging and labs to ER.  Spent over 40 minutes obtaining H&P, performing physical, discussing treatment plan and plan for follow-up with patient. Patient agrees with plan.    Final Clinical Impressions(s) / UC Diagnoses   Final diagnoses:  Hypoxia  Shortness of breath     Discharge Instructions     Please had straight to [facility], ER for further evaluation of low oxygen and shortness of breath.  If you experience dizziness, chest pain, worsening shortness of breath, pain in left arm, loss of consciousness, etc. while going to the ER-stop and call 911 immediately.  Please do head straight to the ER today.  If you do not do this, your shortness of breath could progress at home and lead to emergent situation.  This could even result in a heart attack or stroke, and you could die.   ED Prescriptions    None     PDMP not reviewed this encounter.   Hazel Sams, PA-C 08/04/20 1308

## 2020-08-06 ENCOUNTER — Encounter (HOSPITAL_COMMUNITY): Payer: Self-pay

## 2020-08-06 ENCOUNTER — Emergency Department (HOSPITAL_COMMUNITY)
Admission: EM | Admit: 2020-08-06 | Discharge: 2020-08-06 | Disposition: A | Discharge status: Home / Self Care | Payer: Medicare Other | Attending: Emergency Medicine | Admitting: Emergency Medicine

## 2020-08-06 ENCOUNTER — Other Ambulatory Visit: Payer: Self-pay

## 2020-08-06 ENCOUNTER — Emergency Department (HOSPITAL_COMMUNITY): Payer: Medicare Other

## 2020-08-06 ENCOUNTER — Telehealth (INDEPENDENT_AMBULATORY_CARE_PROVIDER_SITE_OTHER): Payer: Medicare Other | Admitting: Adult Health

## 2020-08-06 ENCOUNTER — Encounter: Payer: Self-pay | Admitting: Adult Health

## 2020-08-06 DIAGNOSIS — R0902 Hypoxemia: Secondary | ICD-10-CM | POA: Insufficient documentation

## 2020-08-06 DIAGNOSIS — Z8541 Personal history of malignant neoplasm of cervix uteri: Secondary | ICD-10-CM | POA: Diagnosis not present

## 2020-08-06 DIAGNOSIS — R059 Cough, unspecified: Secondary | ICD-10-CM

## 2020-08-06 DIAGNOSIS — R062 Wheezing: Secondary | ICD-10-CM | POA: Diagnosis not present

## 2020-08-06 DIAGNOSIS — J811 Chronic pulmonary edema: Secondary | ICD-10-CM | POA: Diagnosis not present

## 2020-08-06 DIAGNOSIS — Z87891 Personal history of nicotine dependence: Secondary | ICD-10-CM | POA: Diagnosis not present

## 2020-08-06 DIAGNOSIS — R0602 Shortness of breath: Secondary | ICD-10-CM

## 2020-08-06 DIAGNOSIS — I509 Heart failure, unspecified: Secondary | ICD-10-CM

## 2020-08-06 DIAGNOSIS — J90 Pleural effusion, not elsewhere classified: Secondary | ICD-10-CM | POA: Diagnosis not present

## 2020-08-06 DIAGNOSIS — J441 Chronic obstructive pulmonary disease with (acute) exacerbation: Secondary | ICD-10-CM

## 2020-08-06 DIAGNOSIS — J9811 Atelectasis: Secondary | ICD-10-CM | POA: Diagnosis not present

## 2020-08-06 DIAGNOSIS — Z20822 Contact with and (suspected) exposure to covid-19: Secondary | ICD-10-CM | POA: Insufficient documentation

## 2020-08-06 LAB — CBC WITH DIFFERENTIAL/PLATELET
Abs Immature Granulocytes: 0.06 10*3/uL (ref 0.00–0.07)
Basophils Absolute: 0.1 10*3/uL (ref 0.0–0.1)
Basophils Relative: 1 %
Eosinophils Absolute: 0.2 10*3/uL (ref 0.0–0.5)
Eosinophils Relative: 2 %
HCT: 35.7 % — ABNORMAL LOW (ref 36.0–46.0)
Hemoglobin: 11.2 g/dL — ABNORMAL LOW (ref 12.0–15.0)
Immature Granulocytes: 1 %
Lymphocytes Relative: 25 %
Lymphs Abs: 2.2 10*3/uL (ref 0.7–4.0)
MCH: 30.3 pg (ref 26.0–34.0)
MCHC: 31.4 g/dL (ref 30.0–36.0)
MCV: 96.5 fL (ref 80.0–100.0)
Monocytes Absolute: 0.7 10*3/uL (ref 0.1–1.0)
Monocytes Relative: 8 %
Neutro Abs: 5.5 10*3/uL (ref 1.7–7.7)
Neutrophils Relative %: 63 %
Platelets: 414 10*3/uL — ABNORMAL HIGH (ref 150–400)
RBC: 3.7 MIL/uL — ABNORMAL LOW (ref 3.87–5.11)
RDW: 14 % (ref 11.5–15.5)
WBC: 8.7 10*3/uL (ref 4.0–10.5)
nRBC: 0 % (ref 0.0–0.2)

## 2020-08-06 LAB — I-STAT CHEM 8, ED
BUN: 21 mg/dL (ref 8–23)
Calcium, Ion: 1.25 mmol/L (ref 1.15–1.40)
Chloride: 101 mmol/L (ref 98–111)
Creatinine, Ser: 0.8 mg/dL (ref 0.44–1.00)
Glucose, Bld: 101 mg/dL — ABNORMAL HIGH (ref 70–99)
HCT: 35 % — ABNORMAL LOW (ref 36.0–46.0)
Hemoglobin: 11.9 g/dL — ABNORMAL LOW (ref 12.0–15.0)
Potassium: 4.7 mmol/L (ref 3.5–5.1)
Sodium: 139 mmol/L (ref 135–145)
TCO2: 29 mmol/L (ref 22–32)

## 2020-08-06 LAB — COMPREHENSIVE METABOLIC PANEL
ALT: 12 U/L (ref 0–44)
AST: 17 U/L (ref 15–41)
Albumin: 3.3 g/dL — ABNORMAL LOW (ref 3.5–5.0)
Alkaline Phosphatase: 78 U/L (ref 38–126)
Anion gap: 11 (ref 5–15)
BUN: 21 mg/dL (ref 8–23)
CO2: 27 mmol/L (ref 22–32)
Calcium: 9.8 mg/dL (ref 8.9–10.3)
Chloride: 100 mmol/L (ref 98–111)
Creatinine, Ser: 0.84 mg/dL (ref 0.44–1.00)
GFR, Estimated: >60 mL/min (ref >60)
Glucose, Bld: 101 mg/dL — ABNORMAL HIGH (ref 70–99)
Potassium: 4.4 mmol/L (ref 3.5–5.1)
Sodium: 138 mmol/L (ref 135–145)
Total Bilirubin: 0.8 mg/dL (ref 0.3–1.2)
Total Protein: 7.5 g/dL (ref 6.5–8.1)

## 2020-08-06 LAB — BLOOD GAS, VENOUS
Acid-Base Excess: 3.5 mmol/L — ABNORMAL HIGH (ref 0.0–2.0)
Bicarbonate: 29.6 mmol/L — ABNORMAL HIGH (ref 20.0–28.0)
O2 Saturation: 19.5 %
Patient temperature: 98.6
pCO2, Ven: 55.3 mmHg (ref 44.0–60.0)
pH, Ven: 7.349 (ref 7.250–7.430)
pO2, Ven: 18.9 mmHg — CL (ref 32.0–45.0)

## 2020-08-06 LAB — TROPONIN I (HIGH SENSITIVITY): Troponin I (High Sensitivity): 14 ng/L (ref <18)

## 2020-08-06 LAB — BRAIN NATRIURETIC PEPTIDE: B Natriuretic Peptide: 258.3 pg/mL — ABNORMAL HIGH (ref 0.0–100.0)

## 2020-08-06 LAB — RESP PANEL BY RT-PCR (FLU A&B, COVID) ARPGX2
Influenza A by PCR: NEGATIVE
Influenza B by PCR: NEGATIVE
SARS Coronavirus 2 by RT PCR: NEGATIVE

## 2020-08-06 MED ORDER — METHYLPREDNISOLONE SODIUM SUCC 125 MG IJ SOLR
125.0000 mg | Freq: Once | INTRAMUSCULAR | Status: AC
  Administered 2020-08-06: 125 mg via INTRAVENOUS
  Filled 2020-08-06: qty 2

## 2020-08-06 MED ORDER — ALBUTEROL SULFATE HFA 108 (90 BASE) MCG/ACT IN AERS
2.0000 | INHALATION_SPRAY | Freq: Once | RESPIRATORY_TRACT | Status: AC
  Administered 2020-08-06: 2 via RESPIRATORY_TRACT
  Filled 2020-08-06: qty 6.7

## 2020-08-06 MED ORDER — FUROSEMIDE 20 MG PO TABS: 20.0000 mg | ORAL_TABLET | Freq: Every day | ORAL | 0 refills | Status: DC

## 2020-08-06 MED ORDER — FUROSEMIDE 10 MG/ML IJ SOLN
20.0000 mg | Freq: Once | INTRAMUSCULAR | Status: DC
  Filled 2020-08-06: qty 4

## 2020-08-06 MED ORDER — PREDNISONE 20 MG PO TABS: ORAL_TABLET | ORAL | 0 refills | Status: AC

## 2020-08-06 NOTE — Progress Notes (Signed)
Virtual Visit via Telephone Note  I connected with Jody Moore on 08/06/20 at  3:30 PM EST by telephone and verified that I am speaking with the correct person using two identifiers.   I discussed the limitations, risks, security and privacy concerns of performing an evaluation and management service by telephone and the availability of in person appointments. I also discussed with the patient that there may be a patient responsible charge related to this service. The patient expressed understanding and agreed to proceed.  Location patient: home Location provider: work or home office Participants present for the call: patient, provider Patient did not have a visit in the prior 7 days to address this/these issue(s).   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  She is being evaluated today for cough and shortness of breath.  She reports having a cough for approximately 2 weeks.  Cough has been nonproductive without fever.  She was seen at urgent care over the weekend where her pulse ox was 80%.  Her pulse ox typically ranges in the low 90s to upper 80s.  She was advised to go to the emergency room via private vehicle but ultimately refused to go and went back home.  She reports today that her pulse ox has been varying between  77 and 90.  She continues to have a nonproductive cough and is feeling short of breath.  It was noted in the urgent care that she was having accessory muscle usage as well as scattered wheezing and rales in her lower lung fields.   Observations/Objective: Patient sounds cheerful and well on the phone. She sounded short of breath with talking; had auditory wheezing and by cough on the phone. Speech and thought processing are grossly intact. Patient reported vitals: 77-90% on RA, 98.7  Assessment and Plan: 1. Shortness of breath -Due to worsening pulse ox and symptoms, there is concern for pneumonia.  Advised emergency room for further evaluation.  She is now agreeable to this plan.  2. Cough   Follow Up Instructions:   I did not refer this patient for an OV in the next 24 hours for this/these issue(s).  I discussed the assessment and treatment plan with the patient. The patient was provided an opportunity to ask questions and all were answered. The patient agreed with the plan and demonstrated an understanding of the instructions.   The patient was advised to call back or seek an in-person evaluation if the symptoms worsen or if the condition fails to improve as anticipated.  I provided 22 minutes of non-face-to-face time during this encounter.   [name], NP

## 2020-08-06 NOTE — ED Triage Notes (Signed)
Pt presents reporting that she has been coughing for 3 days but pt's chart reveals she was also at UC 2 days ago reporting a cough for 2 weeks. Pt's O2 sats 87% on RA.

## 2020-08-06 NOTE — ED Provider Notes (Signed)
[facility] DEPT Provider Note   CSN: [id number] Arrival date & time: 08/06/20  1613     History Chief Complaint  Patient presents with  . Cough    Jody Moore is a 80 y.o. female history of reflux, who presented with shortness of breath and cough.  Patient states that for the last 3 to 4 days, she has been coughing and has subjective shortness of breath.  She went to urgent care 2 days ago and was noted to be hypoxic.  She was told to come to the ER but she decided not to.  She talked to her doctor today and still having coughing spells and shortness of breath so was sent here for further evaluation.  Patient was noted to be hypoxic 86% on room air.  Patient states that she is not on oxygen at home and she does not have known COPD.  She states that she was a former smoker.  Patient denies any recent travel or leg swelling.  Patient states that she did receive [employer] vaccine and then had 2 more shots afterwards so she is fully vaccinated.  She denies any exposure to Covid that she knows of.   The history is provided by the patient.       Past Medical History:  Diagnosis Date  . Cervical cancer ([place])   . Depression   . Essential tremor 10/04/2013  . Femur fracture ([place]) 12/22/2016  . Femur fracture, right ([place]) 12/21/2016  . Gastroesophageal reflux disease without esophagitis 07/10/2015  . 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 Osteoporosis 07/10/2015  . 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 Reflux   . Severe episode of recurrent major depressive disorder, without psychotic features ([place]) 07/10/2015  . Severe protein-calorie malnutrition ([place]) 12/29/2016  . Situational anxiety 07/29/2016  . Traumatic compression fracture of T11 thoracic vertebra, with routine healing, subsequent encounter 12/29/2016    Patient Active Problem List   Diagnosis Date Noted  . Depression 02/16/2017  . Closed fracture of lower end of right  femur with routine healing 01/20/2017  . Traumatic compression fracture of T11 thoracic vertebra, with routine healing, subsequent encounter 12/29/2016  . Acute blood loss as cause of postoperative anemia 12/29/2016  . Severe protein-calorie malnutrition ([place]) 12/29/2016  . Allergic drug rash due to anti-infective agent 12/29/2016  . Femur fracture ([place]) 12/22/2016  . Femur fracture, right ([place]) 12/21/2016  . Prediabetes 07/29/2016  . Situational anxiety 07/29/2016  . Gastroesophageal reflux disease without esophagitis 07/10/2015  . Severe episode of recurrent major depressive disorder, without psychotic features ([place]) 07/10/2015  . Osteoporosis 07/10/2015  . 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 Essential tremor 10/04/2013  . 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 ANKLE SURGERY    . CATARACT EXTRACTION Bilateral   . CERVIX SURGERY  1980   cancer-radiation   . FEMUR SURGERY  2005   fractured right femur   . HIP SURGERY  2006   fractured right hip   . IR RADIOLOGIST EVAL & MGMT  01/14/2017  . ORIF FEMUR FRACTURE Right 12/22/2016   Procedure: OPEN REDUCTION INTERNAL FIXATION (ORIF) DISTAL FEMUR FRACTURE;  Surgeon: [name], MD;  Location: [facility];  Service: Orthopedics;  Laterality: Right;  . TONSILLECTOMY       OB History   No obstetric history on file.     Family History  Problem Relation Age  of Onset  . Heart disease Father   . Bladder Cancer Father   . Diabetes Father     Social History   Tobacco Use  . Smoking status: Former Smoker    Packs/day: 2.00    Types: Cigarettes    Quit date: 12/2016    Years since quitting: 3.6  . Smokeless tobacco: Never Used  Vaping Use  . Vaping Use: Every day  Substance Use Topics  . Alcohol use: No  . Drug use: No    Home Medications Prior to Admission medications   Medication Sig Start Date End Date Taking? Authorizing Provider   gabapentin (NEURONTIN) 100 MG capsule Take 1 capsule (100 mg total) by mouth 2 (two) times daily. 04/24/20 07/23/20  Nafziger, Tommi Rumps, NP  ibuprofen (ADVIL,MOTRIN) 200 MG tablet Take 200 mg by mouth every 6 (six) hours as needed.    [provider]  OLANZapine (ZYPREXA) 10 MG tablet Take 10 mg by mouth at bedtime. 07/20/20   [provider]  ondansetron (ZOFRAN) 4 MG tablet Take 1 tablet (4 mg total) by mouth every 8 (eight) hours as needed for nausea or vomiting. 10/10/19   Nafziger, Tommi Rumps, NP  PARoxetine (PAXIL) 40 MG tablet TAKE 1 TABLET(40 MG) BY MOUTH DAILY 06/12/20   Nafziger, Tommi Rumps, NP  QUEtiapine (SEROQUEL) 100 MG tablet TAKE 1 TABLET(100 MG) BY MOUTH AT BEDTIME 06/11/20   Nafziger, Tommi Rumps, NP  Vitamin D, Ergocalciferol, (DRISDOL) 1.25 MG (50000 UNIT) CAPS capsule Take 1 capsule (50,000 Units total) by mouth every 7 (seven) days. 12/07/19   Nafziger, Tommi Rumps, NP    Allergies    Influenza vaccines, Omeprazole, and Codeine sulfate  Review of Systems   Review of Systems  Respiratory: Positive for cough and shortness of breath.   All other systems reviewed and are negative.   Physical Exam Updated Vital Signs BP (!) 146/70   Pulse 71   Temp 98 F (36.7 C) (Oral)   Resp 19   SpO2 91%   Physical Exam Vitals and nursing note reviewed.  Constitutional:      Comments: Slightly tachypneic  HENT:     Head: Normocephalic.     Nose: Nose normal.     Mouth/Throat:     Mouth: Mucous membranes are moist.  Eyes:     Extraocular Movements: Extraocular movements intact.     Pupils: Pupils are equal, round, and reactive to light.  Cardiovascular:     Rate and Rhythm: Normal rate and regular rhythm.     Pulses: Normal pulses.     Heart sounds: Normal heart sounds.  Pulmonary:     Comments: Diminished throughout and mild wheezing bilateral Abdominal:     General: Abdomen is flat.     Palpations: Abdomen is soft.  Musculoskeletal:        General: Normal range of motion.      Cervical back: Normal range of motion and neck supple.  Skin:    General: Skin is warm.     Capillary Refill: Capillary refill takes less than 2 seconds.  Neurological:     General: No focal deficit present.     Mental Status: She is oriented to person, place, and time.  Psychiatric:        Mood and Affect: Mood normal.        Behavior: Behavior normal.     ED Results / Procedures / Treatments   Labs (all labs ordered are listed, but only abnormal results are displayed) Labs Reviewed  CBC  WITH DIFFERENTIAL/PLATELET - Abnormal; Notable for the following components:      Result Value   RBC 3.70 (*)    Hemoglobin 11.2 (*)    HCT 35.7 (*)    Platelets 414 (*)    All other components within normal limits  COMPREHENSIVE METABOLIC PANEL - Abnormal; Notable for the following components:   Glucose, Bld 101 (*)    Albumin 3.3 (*)    All other components within normal limits  BLOOD GAS, VENOUS - Abnormal; Notable for the following components:   pO2, Ven 18.9 (*)    Bicarbonate 29.6 (*)    Acid-Base Excess 3.5 (*)    All other components within normal limits  BRAIN NATRIURETIC PEPTIDE - Abnormal; Notable for the following components:   B Natriuretic Peptide 258.3 (*)    All other components within normal limits  I-STAT CHEM 8, ED - Abnormal; Notable for the following components:   Glucose, Bld 101 (*)    Hemoglobin 11.9 (*)    HCT 35.0 (*)    All other components within normal limits  RESP PANEL BY RT-PCR (FLU A&B, COVID) ARPGX2  TROPONIN I (HIGH SENSITIVITY)    EKG EKG Interpretation  Date/Time:  Tuesday August 06 2020 17:02:32 EST Ventricular Rate:  70 PR Interval:    QRS Duration: 93 QT Interval:  405 QTC Calculation: 437 R Axis:   -25 Text Interpretation: Sinus rhythm Borderline left axis deviation Abnormal R-wave progression, early transition No significant change since last tracing Confirmed by Wandra Arthurs [phone]) on 08/06/2020 5:05:34 PM   Radiology DG Chest Port 1  View  Result Date: 08/06/2020 CLINICAL DATA:  Shortness of breath. EXAM: PORTABLE CHEST 1 VIEW COMPARISON:  December 21, 2016. FINDINGS: The heart size and mediastinal contours are within normal limits. Probable bilateral pulmonary edema is noted. Hypoinflation of the lungs is noted with minimal bibasilar subsegmental atelectasis and small pleural effusions. No pneumothorax is noted. The visualized skeletal structures are unremarkable. IMPRESSION: Hypoinflation of the lungs with minimal bibasilar subsegmental atelectasis and small pleural effusions. Probable bilateral pulmonary edema is noted. Electronically Signed   By: Marijo Conception M.D.   On: 08/06/2020 16:52    Procedures Procedures   Medications Ordered in ED Medications  furosemide (LASIX) injection 20 mg (has no administration in time range)  albuterol (VENTOLIN HFA) 108 (90 Base) MCG/ACT inhaler 2 puff (2 puffs Inhalation Given 08/06/20 1733)  methylPREDNISolone sodium succinate (SOLU-MEDROL) 125 mg/2 mL injection 125 mg (125 mg Intravenous Given 08/06/20 1733)    ED Course  I have reviewed the triage vital signs and the nursing notes.  Pertinent labs & imaging results that were available during my care of the patient were reviewed by me and considered in my medical decision making (see chart for details).    MDM Rules/Calculators/A&P                         Shandell Giovanni is a 80 y.o. female here with shortness of breath and cough and hypoxia.  I think likely COPD exacerbation.  Also consider Covid and pneumonia as well.  Will get CBC, CMP, chest x-ray.  Will give albuterol and reassess.  We will also test for Covid.  6:25 PM  CXR showed possible pulmonary edema. BNP 250.  Patient is still hypoxic around 88 to 89%.  Covid is negative.  Her plan to admit patient for hypoxia is likely from COPD and CHF.  However patient is  adamant that she wants to go home.  She understands that she may get worse and she may die if her oxygen is  too low.  She is willing to sign AMA paperwork and understands the risks.  Patient was given Lasix and Solu-Medrol and albuterol in the ED. Sister is at bedside and will take her to her primary care doctor in the morning.  She will likely need oxygen set up   Final Clinical Impression(s) / ED Diagnoses Final diagnoses:  None    Rx / DC Orders ED Discharge Orders    None       Drenda Freeze, MD 08/06/20 [place]

## 2020-08-06 NOTE — ED Notes (Signed)
RN came into give pt lasix and she was dressing and had pulled IV out. Pt refused meds

## 2020-08-06 NOTE — ED Notes (Signed)
VBG 18.9 MD made aware

## 2020-08-06 NOTE — TOC Initial Note (Signed)
Transition of Care [facility]) - Initial/Assessment Note    Patient Details  Name: Jody Moore MRN: [id number] Date of Birth: [birth year]  [facility] CM/SW Contact:    Erenest Rasher, RN Phone Number: 08/06/2020, 6:27 PM  Clinical Narrative:                  TOC CM spoke to pt and sister. Pt states she is not going to stay. Explained she needs oxygen for home. States she is not going to stay. Gave permission to speak to sister. Explained to sister that PCP's can arrange oxygen and HH from the office. States she will contact office in the am to schedule for her to see PCP in am. ED provider updated.    Barriers to Discharge: Patient left Against Medical Advice [facility])   Patient Goals and CMS Choice        Expected Discharge Plan and Services                                                Prior Living Arrangements/Services                       Activities of Daily Living  Permission Sought/Granted  Emotional Assessment  Admission diagnosis:  chronic cough, blood oxygen down to 80 Patient Active Problem List   Diagnosis Date Noted  . Depression 02/16/2017  . Closed fracture of lower end of right femur with routine healing 01/20/2017  . Traumatic compression fracture of T11 thoracic vertebra, with routine healing, subsequent encounter 12/29/2016  . Acute blood loss as cause of postoperative anemia 12/29/2016  . Severe protein-calorie malnutrition ([place]) 12/29/2016  . Allergic drug rash due to anti-infective agent 12/29/2016  . Femur fracture ([place]) 12/22/2016  . Femur fracture, right ([place]) 12/21/2016  . Prediabetes 07/29/2016  . Situational anxiety 07/29/2016  . Gastroesophageal reflux disease without esophagitis 07/10/2015  . Severe episode of recurrent major depressive disorder, without psychotic features ([place]) 07/10/2015  . Osteoporosis 07/10/2015  . 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 Essential tremor  10/04/2013  . 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

## 2020-08-06 NOTE — Discharge Instructions (Signed)
You signed out Jody Moore.  You need to see your primary care doctor tomorrow to get oxygen set up.  Please take prednisone as prescribed and use albuterol every 4 hours as needed for shortness of breath  You have some fluid in your lungs and please take Lasix 20 mg daily for 3 days  Return to ER if you have worse shortness of breath, leg swelling, cough, fever

## 2020-08-07 ENCOUNTER — Other Ambulatory Visit: Payer: Self-pay | Admitting: Adult Health

## 2020-08-07 ENCOUNTER — Telehealth: Payer: Medicare Other | Admitting: Adult Health

## 2020-08-07 ENCOUNTER — Encounter: Payer: Self-pay | Admitting: Adult Health

## 2020-08-07 DIAGNOSIS — R0902 Hypoxemia: Secondary | ICD-10-CM

## 2020-08-14 ENCOUNTER — Telehealth: Payer: Self-pay | Admitting: Adult Health

## 2020-08-14 ENCOUNTER — Encounter: Payer: Self-pay | Admitting: Adult Health

## 2020-08-14 ENCOUNTER — Ambulatory Visit (INDEPENDENT_AMBULATORY_CARE_PROVIDER_SITE_OTHER): Payer: Medicare Other | Admitting: Adult Health

## 2020-08-14 ENCOUNTER — Other Ambulatory Visit: Payer: Self-pay

## 2020-08-14 VITALS — BP 132/80 | HR 71 | Temp 97.8°F | Wt 92.8 lb

## 2020-08-14 DIAGNOSIS — J439 Emphysema, unspecified: Secondary | ICD-10-CM | POA: Diagnosis not present

## 2020-08-14 NOTE — Telephone Encounter (Signed)
Pt is currently being seen in the office.

## 2020-08-14 NOTE — Telephone Encounter (Signed)
Patient is requesting that Tommi Rumps give her a call.  She would not give any information as to why. She only stated that it was person.  Please advise.

## 2020-08-14 NOTE — Progress Notes (Signed)
Subjective:    Patient ID: Jody Moore, female    DOB: [birth year], 80 y.o.   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  She presents to the office today for need for oxygen therapy due to hypoxia secondary to COPD.    She was recently seen in the emergency room on 08/06/2020 for COPD exacerbation.  2 days prior she was seen at urgent care and was noted to be hypoxic.  While in the ER she was noted to have a pulse ox of 86% on room air  Her x-ray showed possible pulmonary edema.  BNP 250.  She remained hypoxic around 88 to 89% on room air.  Her Covid test was negative.  The plan was to admit for hypoxia likely from COPD and CHF however the patient wanted to go home and signed out AMA.  While in the emergency room she was given Lasix and Solu-Medrol as well as albuterol.  Today she reports she continues to have shortness of breath especially with exertion but feels back to baseline.  Her sister who is with her has been monitoring her pulse ox at home with readings between 87% and 94% ( baseline). The patient and her sister both conclude that the patient becoming increasingly short of breath with mild exertion.   Review of Systems See HPI   Past Medical History:  Diagnosis Date  . Cervical cancer ([place])   . Depression   .  Essential tremor 10/04/2013  . Femur fracture ([place]) 12/22/2016  . Femur fracture, right ([place]) 12/21/2016  . Gastroesophageal reflux disease without esophagitis 07/10/2015  . 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 Osteoporosis 07/10/2015  . 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 Reflux   . Severe episode of recurrent major depressive disorder, without psychotic features ([place]) 07/10/2015  . Severe protein-calorie malnutrition ([place]) 12/29/2016  . Situational anxiety 07/29/2016  . Traumatic compression fracture of T11 thoracic vertebra, with routine healing, subsequent encounter 12/29/2016    Social History   Socioeconomic History  . Marital status: Widowed    Spouse name: Not on file  . Number of children: 0  . Years of education: Not on file  . Highest education level: Not on file  Occupational History  . 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 Smoking status: Former Smoker    Packs/day: 2.00    Types: Cigarettes    Quit date: 12/2016    Years since quitting: 3.6  . Smokeless tobacco: Never Used  Vaping Use  . Vaping Use: Every day  Substance and Sexual Activity  . Alcohol use: No  . Drug use: No  . Sexual activity: Never  Other Topics Concern  . Not on file  Social History Narrative   Diet:   Do you drink/eat things with caffeine? yes   Marital status:  Married  What year were you married? 2015   Do you live in a house, apartment, assisted living, condo, trailer, etc)? house   Is it one or more stories? yes   How many persons live in your home? 2                                                                                               Do you have any pets in your home?  2 cats   Current or past profession: Hospital doctor   Do you exercise?    no                                                 Type & how often:   Do you have a living will?  no   Do you have a DNR Form?  no   Do you have a POA/HPOA forms?  No    Admitted to [facility] 12/25/16      DNR      07/20/18: Lives alone in one-level home, has two cats.   Sister, sylvia, local and assists as needed, but pt. mostly independent with ADLs, IADLs   Relies heavily on sister for social/emotional support   Social Determinants of Health   Financial Resource Strain: Low Risk   . Difficulty of Paying Living Expenses: Not hard at all  Food Insecurity: No Food Insecurity  . Worried About Charity fundraiser in the Last Year: Never true  . Ran Out of Food in the Last Year: Never true  Transportation Needs: No Transportation Needs  . Lack of Transportation (Medical): No  . Lack of Transportation (Non-Medical): No  Physical Activity: Inactive  . Days of Exercise per Week: 0 days  . Minutes of Exercise per Session: 0 min  Stress: No Stress Concern Present  . Feeling of Stress : Only a little  Social Connections: Socially Isolated  . Frequency of Communication with Friends and Family: More than three times a week  . Frequency of Social Gatherings with Friends and Family: Once a week  . Attends Religious Services: Never  . Active Member of Clubs or Organizations: No  . Attends Archivist Meetings: Never  . Marital Status: Widowed  Intimate Partner Violence: Not At Risk  . Fear of Current or Ex-Partner: No  . Emotionally Abused: No  . Physically Abused: No  . Sexually Abused: No    Past Surgical History:  Procedure Laterality Date  . ANKLE SURGERY    . CATARACT EXTRACTION Bilateral   . CERVIX SURGERY  1980   cancer-radiation   . FEMUR SURGERY  2005   fractured right femur   . HIP SURGERY  2006   fractured right hip   . IR RADIOLOGIST EVAL & MGMT  01/14/2017  . ORIF FEMUR FRACTURE Right 12/22/2016   Procedure: OPEN REDUCTION INTERNAL FIXATION (ORIF) DISTAL FEMUR FRACTURE;  Surgeon: [name], MD;  Location: [facility];  Service:  Orthopedics;  Laterality: Right;  . TONSILLECTOMY      Family History  Problem  Relation Age of Onset  . Heart disease Father   . Bladder Cancer Father   . Diabetes Father     Allergies  Allergen Reactions  . Influenza Vaccines Hives    Hives/swelling/injection site war to the touch. No relief with benadryl   . Omeprazole Hives and Other (See Comments)  . Codeine Sulfate Rash    Current Outpatient Medications on File Prior to Visit  Medication Sig Dispense Refill  . furosemide (LASIX) 20 MG tablet Take 1 tablet (20 mg total) by mouth daily. 3 tablet 0  . ibuprofen (ADVIL,MOTRIN) 200 MG tablet Take 200 mg by mouth every 6 (six) hours as needed.    [name] OLANZapine (ZYPREXA) 10 MG tablet Take 10 mg by mouth at bedtime.    . ondansetron (ZOFRAN) 4 MG tablet Take 1 tablet (4 mg total) by mouth every 8 (eight) hours as needed for nausea or vomiting. 20 tablet 0  . PARoxetine (PAXIL) 40 MG tablet TAKE 1 TABLET(40 MG) BY MOUTH DAILY 90 tablet 1  . QUEtiapine (SEROQUEL) 100 MG tablet TAKE 1 TABLET(100 MG) BY MOUTH AT BEDTIME 90 tablet 1  . Vitamin D, Ergocalciferol, (DRISDOL) 1.25 MG (50000 UNIT) CAPS capsule Take 1 capsule (50,000 Units total) by mouth every 7 (seven) days. 12 capsule 1  . gabapentin (NEURONTIN) 100 MG capsule Take 1 capsule (100 mg total) by mouth 2 (two) times daily. 180 capsule 1   No current facility-administered medications on file prior to visit.    BP 132/80 (BP Location: Left Arm, Patient Position: Sitting, Cuff Size: Normal)   Pulse 71   Temp 97.8 F (36.6 C) (Oral)   Wt 92 lb 12.8 oz (42.1 kg)   SpO2 (!) 87%   BMI 16.97 kg/m       Objective:   Physical Exam Vitals and nursing note reviewed.  Constitutional:      Appearance: Normal appearance.  Cardiovascular:     Rate and Rhythm: Normal rate and regular rhythm.     Pulses: Normal pulses.     Heart sounds: Normal heart sounds.  Pulmonary:     Effort: Pulmonary effort is normal.     Breath sounds: Decreased air movement present.  Musculoskeletal:        General: Normal range of  motion.  Skin:    General: Skin is warm and dry.     Capillary Refill: Capillary refill takes less than 2 seconds.  Neurological:     General: No focal deficit present.     Mental Status: She is alert and oriented to person, place, and time.     Motor: Weakness present.     Gait: Gait abnormal.  Psychiatric:        Mood and Affect: Mood normal.        Behavior: Behavior normal.        Thought Content: Thought content normal.        Judgment: Judgment normal.       Assessment & Plan:  1. Pulmonary emphysema, unspecified emphysema type ([place]) -O2 sat at room air while at rest in the office today was 87%.  Patient appears to be at baseline.  I do believe that this patient would benefit from oxygen therapy at 2 L via nasal cannula since she has a severe lung disease resulting in hypoxia.  Symptoms may improve with oxygen therapy and she could perform more activities of  daily living as well as be able to enjoy a being outside of her home more often. - For home use only DME oxygen  [name], NP NPI [id number]

## 2020-08-15 ENCOUNTER — Telehealth: Payer: Self-pay | Admitting: Adult Health

## 2020-08-15 NOTE — Telephone Encounter (Signed)
error 

## 2020-08-17 DIAGNOSIS — J441 Chronic obstructive pulmonary disease with (acute) exacerbation: Secondary | ICD-10-CM | POA: Diagnosis not present

## 2020-09-04 ENCOUNTER — Telehealth: Payer: Self-pay | Admitting: Adult Health

## 2020-09-04 NOTE — Telephone Encounter (Signed)
[facility] called to see if we received the order for oxygen. I advised [facility] that we received it and faxed it back last week. Then they wanted to know the date and time the fax was sent and what fax number the order was sent back to.  I advised [facility] that I will have the medical assistant to contact them back with that information.  [facility] [phone]  Fax: [phone]

## 2020-09-17 DIAGNOSIS — J441 Chronic obstructive pulmonary disease with (acute) exacerbation: Secondary | ICD-10-CM | POA: Diagnosis not present

## 2020-10-01 ENCOUNTER — Encounter: Payer: Self-pay | Admitting: Adult Health

## 2020-10-17 DIAGNOSIS — J441 Chronic obstructive pulmonary disease with (acute) exacerbation: Secondary | ICD-10-CM | POA: Diagnosis not present

## 2020-10-22 ENCOUNTER — Encounter: Payer: Self-pay | Admitting: Adult Health

## 2020-10-23 ENCOUNTER — Encounter: Payer: Self-pay | Admitting: Adult Health

## 2020-10-24 ENCOUNTER — Other Ambulatory Visit: Payer: Self-pay | Admitting: Adult Health

## 2020-10-24 ENCOUNTER — Encounter: Payer: Self-pay | Admitting: Adult Health

## 2020-10-24 MED ORDER — OLANZAPINE 10 MG PO TABS: 10.0000 mg | ORAL_TABLET | Freq: Every day | ORAL | 1 refills | Status: DC

## 2020-11-03 ENCOUNTER — Encounter: Payer: Self-pay | Admitting: Adult Health

## 2020-11-04 ENCOUNTER — Encounter: Payer: Self-pay | Admitting: Adult Health

## 2020-11-05 ENCOUNTER — Encounter: Payer: Self-pay | Admitting: Adult Health

## 2020-11-05 ENCOUNTER — Telehealth (INDEPENDENT_AMBULATORY_CARE_PROVIDER_SITE_OTHER): Payer: Medicare Other | Admitting: Adult Health

## 2020-11-05 VITALS — Ht 62.0 in | Wt 92.0 lb

## 2020-11-05 DIAGNOSIS — J441 Chronic obstructive pulmonary disease with (acute) exacerbation: Secondary | ICD-10-CM | POA: Diagnosis not present

## 2020-11-05 MED ORDER — AZITHROMYCIN 250 MG PO TABS: ORAL_TABLET | ORAL | 0 refills | Status: AC

## 2020-11-05 MED ORDER — PREDNISONE 10 MG PO TABS: ORAL_TABLET | ORAL | 0 refills | Status: DC

## 2020-11-05 MED ORDER — PREDNISONE 5 MG PO TABS: 5.0000 mg | ORAL_TABLET | Freq: Every day | ORAL | 1 refills | Status: AC

## 2020-11-05 NOTE — Telephone Encounter (Signed)
Please advise 

## 2020-11-05 NOTE — Telephone Encounter (Signed)
Pts sister is calling in to give the pts vital signs  11/05/2020  Temp: 99.1  O2: 83  Bp: 114/54 and COVID test was done and it was negative (pt just wanted to have one done).  Sister would like to have a call back.

## 2020-11-05 NOTE — Progress Notes (Signed)
Virtual Visit via Video Note  I connected with@ on 11/05/20 at  5:00 PM EDT by a video enabled telemedicine application and verified that I am speaking with the correct person using two identifiers.  Location patient: home Location provider:work or home office Persons participating in the virtual visit: patient, provider,sister  I discussed the limitations of evaluation and management by telemedicine and the availability of in person appointments. The patient expressed understanding and agreed to proceed.   HPI: 80 year old female who is being evaluated today for semiproductive cough and wheezing.  She reports that her symptoms have been present for approximately 2 months but have recently become worse.  Her cough is semiproductive.  She has had episodes of shortness of breath past baseline and she has continuous wheezing.  Does have a history of pulmonary emphysema, wears her oxygen "occasionally".  Her sister has been monitoring her socks at home with reported readings between 77 and 94.  Lows are without oxygen.  She denies fevers or chills.      ROS: See pertinent positives and negatives per HPI.  Past Medical History:  Diagnosis Date  . Cervical cancer ([place])   . Depression   . Essential tremor 10/04/2013  . Femur fracture ([place]) 12/22/2016  . Femur fracture, right ([place]) 12/21/2016  . Gastroesophageal reflux disease without esophagitis 07/10/2015  . 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 Osteoporosis 07/10/2015  . 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 Reflux   . Severe episode of recurrent major depressive disorder, without psychotic features ([place]) 07/10/2015  . Severe protein-calorie malnutrition ([place]) 12/29/2016  . Situational anxiety 07/29/2016  . Traumatic compression fracture of T11 thoracic vertebra, with routine healing, subsequent encounter 12/29/2016    Past Surgical History:  Procedure Laterality Date  . ANKLE SURGERY    . CATARACT EXTRACTION Bilateral    . CERVIX SURGERY  1980   cancer-radiation   . FEMUR SURGERY  2005   fractured right femur   . HIP SURGERY  2006   fractured right hip   . IR RADIOLOGIST EVAL & MGMT  01/14/2017  . ORIF FEMUR FRACTURE Right 12/22/2016   Procedure: OPEN REDUCTION INTERNAL FIXATION (ORIF) DISTAL FEMUR FRACTURE;  Surgeon: [name], MD;  Location: [facility];  Service: Orthopedics;  Laterality: Right;  . TONSILLECTOMY      Family History  Problem Relation Age of Onset  . Heart disease Father   . Bladder Cancer Father   . Diabetes Father        Current Outpatient Medications:  .  furosemide (LASIX) 20 MG tablet, Take 1 tablet (20 mg total) by mouth daily., Disp: 3 tablet, Rfl: 0 .  ibuprofen (ADVIL,MOTRIN) 200 MG tablet, Take 200 mg by mouth every 6 (six) hours as needed., Disp: , Rfl:  .  OLANZapine (ZYPREXA) 10 MG tablet, Take 1 tablet (10 mg total) by mouth at bedtime., Disp: 90 tablet, Rfl: 1 .  ondansetron (ZOFRAN) 4 MG tablet, Take 1 tablet (4 mg total) by mouth every 8 (eight) hours as needed for nausea or vomiting., Disp: 20 tablet, Rfl: 0 .  PARoxetine (PAXIL) 40 MG tablet, TAKE 1 TABLET(40 MG) BY MOUTH DAILY, Disp: 90 tablet, Rfl: 1 .  QUEtiapine (SEROQUEL) 100 MG tablet, TAKE 1 TABLET(100 MG) BY MOUTH AT BEDTIME, Disp: 90 tablet, Rfl: 1 .  Vitamin D, Ergocalciferol, (DRISDOL) 1.25 MG (50000 UNIT) CAPS capsule, Take 1 capsule (50,000 Units total) by mouth every 7 (seven) days., Disp: 12 capsule, Rfl: 1 .  chlorhexidine (PERIDEX) 0.12 % solution, 15 mLs 2 (two) times daily., Disp: , Rfl:  .  gabapentin (NEURONTIN) 100 MG capsule, Take 1 capsule (100 mg total) by mouth 2 (two) times daily., Disp: 180 capsule, Rfl: 1  EXAM:  VITALS per patient if applicable:  GENERAL: alert, oriented, appears well and in no acute distress  HEENT: atraumatic, conjunttiva clear, no obvious abnormalities on inspection of external nose and ears  NECK: normal movements of the head and neck  LUNGS: on  inspection no signs of respiratory distress, breathing rate appears normal, no obvious gross SOB, gasping or wheezing  CV: no obvious cyanosis  MS: moves all visible extremities without noticeable abnormality  PSYCH/NEURO: pleasant and cooperative, no obvious depression or anxiety, speech and thought processing grossly intact  ASSESSMENT AND PLAN:  Discussed the following assessment and plan:  1. COPD exacerbation (HCC) - predniSONE (DELTASONE) 10 MG tablet; 40 mg x 3 days, 20 mg x 3 days, 10 mg x 3 days  Dispense: 21 tablet; Refill: 0 - predniSONE (DELTASONE) 5 MG tablet; Take 1 tablet (5 mg total) by mouth daily with breakfast. Start after taper  Dispense: 90 tablet; Refill: 1 - azithromycin (ZITHROMAX) 250 MG tablet; Take 2 tablets on day 1, then 1 tablet daily on days 2 through 5  Dispense: 6 tablet; Refill: 0     I discussed the assessment and treatment plan with the patient. The patient was provided an opportunity to ask questions and all were answered. The patient agreed with the plan and demonstrated an understanding of the instructions.   The patient was advised to call back or seek an in-person evaluation if the symptoms worsen or if the condition fails to improve as anticipated.   [name], NP

## 2020-11-06 NOTE — Telephone Encounter (Signed)
FYI

## 2020-11-17 DIAGNOSIS — J441 Chronic obstructive pulmonary disease with (acute) exacerbation: Secondary | ICD-10-CM | POA: Diagnosis not present

## 2020-12-04 ENCOUNTER — Other Ambulatory Visit: Payer: Self-pay | Admitting: Adult Health

## 2020-12-04 DIAGNOSIS — F22 Delusional disorders: Secondary | ICD-10-CM

## 2020-12-11 ENCOUNTER — Other Ambulatory Visit: Payer: Self-pay | Admitting: Adult Health

## 2020-12-11 DIAGNOSIS — F332 Major depressive disorder, recurrent severe without psychotic features: Secondary | ICD-10-CM

## 2020-12-17 DIAGNOSIS — J441 Chronic obstructive pulmonary disease with (acute) exacerbation: Secondary | ICD-10-CM | POA: Diagnosis not present

## 2021-01-04 ENCOUNTER — Other Ambulatory Visit: Payer: Self-pay | Admitting: Adult Health

## 2021-01-04 DIAGNOSIS — G250 Essential tremor: Secondary | ICD-10-CM

## 2021-01-14 DIAGNOSIS — H353131 Nonexudative age-related macular degeneration, bilateral, early dry stage: Secondary | ICD-10-CM | POA: Diagnosis not present

## 2021-01-16 ENCOUNTER — Other Ambulatory Visit: Payer: Self-pay

## 2021-01-16 ENCOUNTER — Encounter (HOSPITAL_COMMUNITY): Payer: Self-pay

## 2021-01-16 ENCOUNTER — Ambulatory Visit (HOSPITAL_COMMUNITY)
Admission: EM | Admit: 2021-01-16 | Discharge: 2021-01-16 | Disposition: A | Discharge status: Home / Self Care | Payer: Medicare Other | Attending: Emergency Medicine | Admitting: Emergency Medicine

## 2021-01-16 DIAGNOSIS — R55 Syncope and collapse: Secondary | ICD-10-CM | POA: Diagnosis not present

## 2021-01-16 DIAGNOSIS — W19XXXA Unspecified fall, initial encounter: Secondary | ICD-10-CM | POA: Diagnosis not present

## 2021-01-16 DIAGNOSIS — S0990XA Unspecified injury of head, initial encounter: Secondary | ICD-10-CM

## 2021-01-16 DIAGNOSIS — R002 Palpitations: Secondary | ICD-10-CM

## 2021-01-16 NOTE — ED Notes (Signed)
EKG result and vitals reported to medical provider

## 2021-01-16 NOTE — Discharge Instructions (Addendum)
Go immediately to the emergency department.  Her EKG is not normal and I am concerned that your heart caused her to fall last night.  Let them know immediately if your palpitations lightheadedness, dizziness return.

## 2021-01-16 NOTE — ED Notes (Signed)
Patient is being discharged from the Urgent Care and sent to the Emergency Department via [facility] . Per Dr. Melynda Ripple, patient is in need of higher level of care due to near syncope, head injury, atrial flutter. Patient is aware and verbalizes understanding of plan of care.  Vitals:   01/16/21 0830  BP: 133/72  Pulse: 71  Resp: 15  Temp: 98.5 F (36.9 C)  SpO2: (!) 88%

## 2021-01-16 NOTE — ED Provider Notes (Signed)
HPI  SUBJECTIVE:  Jody Moore is a 80 y.o. female who presents with a fall last night.  She reports preceding diaphoresis, dizziness/lightheadedness, states that she felt as if she was "about to pass out".  No associated visual changes, headache, slurred speech, arm or leg weakness.  She states that she fell, hit the back of her head onto the floor.  No loss of consciousness, amnesia.  No neck pain, chest pain, shortness of breath, abdominal pain.  She has no complaints right now, she is worried about her heart causing her fall.  She states that she does not fall.  She reports palpitations lasting minutes starting this morning.  No associated lightheadedness, dizziness, shortness of breath, nausea, diaphoresis.  There are no aggravating or alleviating factors with the palpitations.  She has had palpitations before, but has never sought medical attention for it.  She has a past medical history of alcoholic induced peripheral neuropathy, murmur, emphysema/COPD not on any home oxygen.  She states that her baseline is around 88%.  She also has a history of osteoporosis, essential tremor.  She is not on any anticoagulants, antiplatelets.  No history of arrhythmia, atrial fibrillation, SVT, falls, diabetes, hypertension, Stroke.  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   Reflux    Severe episode of recurrent major depressive disorder, without psychotic features ([place]) 07/10/2015   Severe protein-calorie malnutrition ([place]) 12/29/2016   Situational anxiety 07/29/2016   Traumatic compression fracture of T11 thoracic vertebra, with routine healing,  subsequent encounter 12/29/2016    Past Surgical History:  Procedure Laterality Date   ANKLE SURGERY     CATARACT EXTRACTION Bilateral    CERVIX SURGERY  1980   cancer-radiation    FEMUR SURGERY  2005   fractured right femur    HIP SURGERY  2006   fractured right hip    IR RADIOLOGIST EVAL & MGMT  01/14/2017   ORIF FEMUR FRACTURE Right 12/22/2016   Procedure: OPEN REDUCTION INTERNAL FIXATION (ORIF) DISTAL FEMUR FRACTURE;  Surgeon: [name], MD;  Location: [facility];  Service: Orthopedics;  Laterality: Right;   TONSILLECTOMY      Family History  Problem Relation Age of Onset   Heart disease Father    Bladder Cancer Father    Diabetes Father     Social History   Tobacco Use   Smoking status: Former    Packs/day: 2.00    Types: Cigarettes    Quit date: 12/2016    Years since quitting: 4.1   Smokeless tobacco: Never  Vaping Use   Vaping Use: Every day  Substance Use Topics   Alcohol use: No   Drug use: No    No current facility-administered medications for this encounter.  Current Outpatient Medications:    chlorhexidine (PERIDEX) 0.12 % solution, 15 mLs 2 (two) times daily., Disp: , Rfl:    furosemide (LASIX) 20 MG tablet, Take 1 tablet (20 mg total) by mouth daily., Disp: 3 tablet, Rfl: 0   gabapentin (NEURONTIN) 100 MG capsule, TAKE 1 CAPSULE(100 MG) BY MOUTH TWICE DAILY, Disp: 180 capsule, Rfl: 0   ibuprofen (ADVIL,MOTRIN) 200 MG tablet,  Take 200 mg by mouth every 6 (six) hours as needed., Disp: , Rfl:    OLANZapine (ZYPREXA) 10 MG tablet, Take 1 tablet (10 mg total) by mouth at bedtime., Disp: 90 tablet, Rfl: 1   ondansetron (ZOFRAN) 4 MG tablet, Take 1 tablet (4 mg total) by mouth every 8 (eight) hours as needed for nausea or vomiting., Disp: 20 tablet, Rfl: 0   PARoxetine (PAXIL) 40 MG tablet, TAKE 1 TABLET(40 MG) BY MOUTH DAILY, Disp: 90 tablet, Rfl: 1   predniSONE (DELTASONE) 10 MG tablet, 40 mg x 3 days, 20 mg x 3 days, 10 mg x 3 days, Disp: 21 tablet,  Rfl: 0   predniSONE (DELTASONE) 5 MG tablet, Take 1 tablet (5 mg total) by mouth daily with breakfast. Start after taper, Disp: 90 tablet, Rfl: 1   QUEtiapine (SEROQUEL) 100 MG tablet, TAKE 1 TABLET(100 MG) BY MOUTH AT BEDTIME, Disp: 90 tablet, Rfl: 1   Vitamin D, Ergocalciferol, (DRISDOL) 1.25 MG (50000 UNIT) CAPS capsule, Take 1 capsule (50,000 Units total) by mouth every 7 (seven) days., Disp: 12 capsule, Rfl: 1  Allergies  Allergen Reactions   Influenza Vaccines Hives    Hives/swelling/injection site war to the touch. No relief with benadryl    Omeprazole Hives and Other (See Comments)   Codeine Sulfate Rash     ROS  As noted in HPI.   Physical Exam  BP 133/72 (BP Location: Left Arm)   Pulse 71   Temp 98.5 F (36.9 C) (Oral)   Resp 15   SpO2 (!) 88%   Constitutional: Well developed, well nourished, no acute distress Eyes:  EOMI, conjunctiva normal bilaterally PERRLA, no direct or consensual photophobia HENT: Normocephalic, atraumatic,mucus membranes moist Respiratory: Normal inspiratory effort, fair air movement.  No appreciable wheezing Cardiovascular: Normal rate, regular rhythm, loud murmur.  No chest wall tenderness GI: nondistended soft, nontender. skin: No rash, skin intact Musculoskeletal: No C, T, L-spine tenderness.  No deformities, no tenderness upper extremities, moving all extremities equally.  She is able to bear weight on her lower extremities. Neurologic: Alert & oriented x 3, GCS 15, cranial nerves III through XII grossly intact, strength upper and lower extremities 5/5 and equal bilaterally.  Speech fluent. Psychiatric: Speech and behavior appropriate   ED Course   Medications - No data to display  Orders Placed This Encounter  Procedures   ED EKG    palpitations    Standing Status:   Standing    Number of Occurrences:   1    Order Specific Question:   Reason for Exam    Answer:   Other (See Comments)    No results found for this or any  previous visit (from the past 24 hour(s)). No results found.  ED Clinical Impression  1. Near syncope   2. Palpitations   3. Fall, initial encounter   4. Traumatic injury of head, initial encounter      ED Assessment/Plan  Previous records reviewed.  Seems at her baseline is in the high 80s, low 90s.  She has been as low as 80% in February.  She was supposed to start on O2 therapy at 2 L in March.   Oxygen saturation fluctuates between 84 and 88% on room air.  She has no respiratory complaints.  Initially had her on 2 L of oxygen which improved her O2 sat to 97%, but discontinued it due to absence of respiratory complaints.  EKG #1: Ventricular rate 70, regular, left axis deviation, sawtooth  atrial waves concerning for atrial flutter.  Patient was symptomatic with EKG  EKG #2: Normal sinus rhythm, rate 71.  Left axis deviation.  No A-flutter.  Patient asymptomatic.  Patient with a near syncopal episode last night.  She hit her head, however, she has no focal neurologic deficits.  GCS 15.  I am concerned that she is going in and out of atrial flutter causing her fall.  Her vitals are stable, her oxygen saturation is at baseline, she peers well, her second EKG has returned to normal, she has no complaints, I feel that she is stable to go to the ED by private vehicle for comprehensive work-up and imaging.  Discussed medical decision making, rationale for transfer into the emergency department and importance of going immediately.  Patient and daughter agree with plan.   No orders of the defined types were placed in this encounter.     *This clinic note was created using Dragon dictation software. Therefore, there may be occasional mistakes despite careful proofreading.  ?    Melynda Ripple, MD 01/16/21 [phone]

## 2021-01-16 NOTE — ED Triage Notes (Signed)
Pt reports falling last night at her home in her living room. States she injured her right wrist. States she has a few bruises on her hand and arms. States her heart was fluttering this morning.

## 2021-01-17 DIAGNOSIS — J441 Chronic obstructive pulmonary disease with (acute) exacerbation: Secondary | ICD-10-CM | POA: Diagnosis not present

## 2021-01-22 ENCOUNTER — Encounter: Payer: Self-pay | Admitting: Adult Health

## 2021-01-23 ENCOUNTER — Other Ambulatory Visit: Payer: Self-pay

## 2021-01-23 ENCOUNTER — Ambulatory Visit (INDEPENDENT_AMBULATORY_CARE_PROVIDER_SITE_OTHER): Payer: Medicare Other | Admitting: Adult Health

## 2021-01-23 ENCOUNTER — Encounter: Payer: Self-pay | Admitting: Adult Health

## 2021-01-23 ENCOUNTER — Ambulatory Visit (INDEPENDENT_AMBULATORY_CARE_PROVIDER_SITE_OTHER): Payer: Medicare Other

## 2021-01-23 VITALS — BP 122/68 | HR 73 | Temp 98.7°F

## 2021-01-23 DIAGNOSIS — R55 Syncope and collapse: Secondary | ICD-10-CM

## 2021-01-23 DIAGNOSIS — R2681 Unsteadiness on feet: Secondary | ICD-10-CM

## 2021-01-23 DIAGNOSIS — R627 Adult failure to thrive: Secondary | ICD-10-CM

## 2021-01-23 NOTE — Progress Notes (Signed)
Subjective:    Patient ID: Jody Moore, female    DOB: 06-Jun-1941, 80 y.o.   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  She presents to the office today with her sister.   She was seen at urgent care 7 days ago after falling the night before.  Per urgent care note she reported preceding diaphoresis, dizziness/lightheadedness and felt as though she was about ready to pass out.  There were no associated visual changes, headaches, slurred speech, arm or leg weakness.  She reported that she fell hit the back of her head onto the floor.  There was no loss of consciousness.  She did not have any neck pain, chest pain, shortness of breath, or abdominal pain.  Additionally, she reported palpitations lasting minutes starting the morning she went to urgent care.  There were no associated symptoms such as lightheadedness, dizziness, shortness of breath, nausea, or diaphoresis.  First EKG showed a ventricular rate of 70, left axis deviation, sawtooth atrial waves concerning for atrial flutter.  She was asymptomatic with this EKG.  Her second EKG showed normal sinus rhythm with no a flutter.  There was concern that he may be going in and out of atrial flutter and this is why she had a syncopal episode.  She was advised to go via private  vehicle to the emergency room for work-up and imaging.  Unfortunately she went home and never went to the emergency room.  Today, she reports that she is feeling better.  She had some intermittent confusion for a few days after the fall but feels as though this is resolving, her sister corroborates this statement.  She has not had any palpitations since being seen, and has not felt lightheaded, dizzy, experienced headaches, or blurred vision.  She is scared about falling again and is nervous about using her walker she does feel unsteady on her feet.  He also has not eating much.  Reports "I am living on boost".  She does not have an appetite, this has been a longstanding issue.  Have tried her on Remeron but she did not like the way it made her feel subsequently stopped taking the medication.    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   Reflux    Severe episode of recurrent major depressive disorder, without psychotic features ([place]) 07/10/2015   Severe protein-calorie malnutrition ([place]) 12/29/2016   Situational anxiety 07/29/2016   Traumatic compression fracture of T11 thoracic vertebra, with routine healing, subsequent encounter 12/29/2016    Social History   Socioeconomic History   Marital status: Widowed    Spouse name: Not on file   Number of children: 0  Years of education: Not on file   Highest education level: Not on file  Occupational History   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 yes   Marital status:  Married                            What year were you married? 2015   Do you live in a house, apartment, assisted living, condo, trailer, etc)? house   Is it one or more stories? yes   How many persons live in your home? 2                                                                                               Do you have any pets in your home?  2 cats   Current or past profession: Hospital doctor   Do you exercise?    no                                                 Type & how often:   Do you have a living will?  no   Do you have a DNR Form?  no   Do you have a POA/HPOA forms?  No   Admitted to [facility] 12/25/16      DNR      07/20/18: Lives alone in one-level home, has two cats.   Sister, sylvia, local and assists as needed, but pt. mostly independent with ADLs, IADLs   Relies heavily on sister for social/emotional support   Social Determinants of Health   Financial Resource Strain: Low Risk    Difficulty of Paying Living Expenses: Not hard at all  Food Insecurity: No Food Insecurity   Worried About Charity fundraiser in the Last Year: Never true   Ran Out of Food in the Last Year: Never true  Transportation Needs: No Transportation Needs   Lack of Transportation (Medical): No   Lack of Transportation (Non-Medical): No  Physical Activity: Inactive   Days of Exercise per Week: 0 days   Minutes of Exercise per Session: 0 min  Stress: No Stress Concern Present   Feeling of Stress : Only a little  Social Connections: Socially Isolated   Frequency of Communication with Friends and Family: More than three times a week   Frequency of Social Gatherings with Friends and Family: Once a  week  Attends Religious Services: Never   Active Member of Clubs or Organizations: No   Attends Archivist Meetings: Never   Marital Status: Widowed  Human resources officer Violence: Not At Risk   Fear of Current or Ex-Partner: No   Emotionally Abused: No   Physically Abused: No   Sexually Abused: No    Past Surgical History:  Procedure Laterality Date   ANKLE SURGERY     CATARACT EXTRACTION Bilateral    CERVIX SURGERY  1980   cancer-radiation    FEMUR SURGERY  2005   fractured right femur    HIP SURGERY  2006   fractured right hip    IR RADIOLOGIST EVAL & MGMT  01/14/2017   ORIF FEMUR FRACTURE Right 12/22/2016   Procedure: OPEN REDUCTION INTERNAL FIXATION (ORIF) DISTAL FEMUR FRACTURE;  Surgeon: [name], MD;  Location: [facility];  Service: Orthopedics;  Laterality: Right;   TONSILLECTOMY      Family History  Problem Relation Age of Onset   Heart disease Father    Bladder Cancer Father    Diabetes Father     Allergies  Allergen Reactions   Influenza Vaccines Hives    Hives/swelling/injection site war to the touch. No relief with benadryl    Omeprazole Hives and Other (See Comments)   Codeine Sulfate Rash    Current Outpatient Medications on File Prior to Visit  Medication Sig Dispense Refill   chlorhexidine (PERIDEX) 0.12 % solution 15 mLs 2 (two) times daily.     ibuprofen (ADVIL,MOTRIN) 200 MG tablet Take 200 mg by mouth every 6 (six) hours as needed.     OLANZapine (ZYPREXA) 10 MG tablet Take 1 tablet (10 mg total) by mouth at bedtime. 90 tablet 1   ondansetron (ZOFRAN) 4 MG tablet Take 1 tablet (4 mg total) by mouth every 8 (eight) hours as needed for nausea or vomiting. 20 tablet 0   PARoxetine (PAXIL) 40 MG tablet TAKE 1 TABLET(40 MG) BY MOUTH DAILY 90 tablet 1   predniSONE (DELTASONE) 5 MG tablet Take 1 tablet (5 mg total) by mouth daily with breakfast. Start after taper 90 tablet 1   QUEtiapine (SEROQUEL) 100 MG tablet TAKE 1 TABLET(100 MG) BY MOUTH  AT BEDTIME 90 tablet 1   Vitamin D, Ergocalciferol, (DRISDOL) 1.25 MG (50000 UNIT) CAPS capsule Take 1 capsule (50,000 Units total) by mouth every 7 (seven) days. 12 capsule 1   PFIZER-BIONT COVID-19 VAC-TRIS SUSP injection      No current facility-administered medications on file prior to visit.    BP 122/68   Pulse 73   Temp 98.7 F (37.1 C) (Oral)   SpO2 90%       Objective:   Physical Exam Constitutional:      Appearance: She is cachectic. She is ill-appearing (chronically).  Cardiovascular:     Rate and Rhythm: Normal rate and regular rhythm.     Pulses: Normal pulses.     Heart sounds: Normal heart sounds.  Pulmonary:     Effort: Pulmonary effort is normal.     Breath sounds: Normal breath sounds.  Skin:    General: Skin is warm and dry.     Capillary Refill: Capillary refill takes less than 2 seconds.  Neurological:     General: No focal deficit present.     Mental Status: She is oriented to person, place, and time.     Motor: Weakness present.     Gait: Gait abnormal.     Comments: In wheelchair  Psychiatric:        Mood and Affect: Mood normal.        Behavior: Behavior normal.        Thought Content: Thought content normal.        Judgment: Judgment normal.       Assessment & Plan:  1. Gait instability  - Ambulatory referral to Home Health  2. Syncope, unspecified syncope type  - Ambulatory referral to [facility] (3-14 DAYS); Future - Consider referral to cardiology   3. Failure to thrive in adult - Have tried remeron but she did not continue it. Will try Megace for two week trial. Side effects reviewed.  - megestrol (MEGACE) 20 MG tablet; Take 1 tablet (20 mg total) by mouth daily.  Dispense: 30 tablet; Refill: 0 - Family will follow up with me in two weeks to let me know how it is going   [name], NP

## 2021-01-23 NOTE — Progress Notes (Unsigned)
Patient enrolled for [employer] to mail a 7 day ZIO XT monitor to her address on file. 

## 2021-01-24 ENCOUNTER — Other Ambulatory Visit: Payer: Self-pay | Admitting: Adult Health

## 2021-01-24 DIAGNOSIS — R627 Adult failure to thrive: Secondary | ICD-10-CM

## 2021-01-24 MED ORDER — MEGESTROL ACETATE 20 MG PO TABS: 20.0000 mg | ORAL_TABLET | Freq: Every day | ORAL | 0 refills | Status: DC

## 2021-01-25 DIAGNOSIS — R55 Syncope and collapse: Secondary | ICD-10-CM

## 2021-01-26 ENCOUNTER — Other Ambulatory Visit: Payer: Self-pay | Admitting: Adult Health

## 2021-02-03 DIAGNOSIS — H26492 Other secondary cataract, left eye: Secondary | ICD-10-CM | POA: Diagnosis not present

## 2021-02-03 DIAGNOSIS — Z961 Presence of intraocular lens: Secondary | ICD-10-CM | POA: Diagnosis not present

## 2021-02-03 DIAGNOSIS — H35722 Serous detachment of retinal pigment epithelium, left eye: Secondary | ICD-10-CM | POA: Diagnosis not present

## 2021-02-03 DIAGNOSIS — H353131 Nonexudative age-related macular degeneration, bilateral, early dry stage: Secondary | ICD-10-CM | POA: Diagnosis not present

## 2021-02-03 DIAGNOSIS — H353221 Exudative age-related macular degeneration, left eye, with active choroidal neovascularization: Secondary | ICD-10-CM | POA: Diagnosis not present

## 2021-02-05 DIAGNOSIS — R55 Syncope and collapse: Secondary | ICD-10-CM | POA: Diagnosis not present

## 2021-02-06 ENCOUNTER — Encounter (INDEPENDENT_AMBULATORY_CARE_PROVIDER_SITE_OTHER): Payer: Medicare Other | Admitting: Ophthalmology

## 2021-02-17 DIAGNOSIS — J441 Chronic obstructive pulmonary disease with (acute) exacerbation: Secondary | ICD-10-CM | POA: Diagnosis not present

## 2021-02-18 ENCOUNTER — Other Ambulatory Visit: Payer: Self-pay

## 2021-02-18 ENCOUNTER — Encounter (INDEPENDENT_AMBULATORY_CARE_PROVIDER_SITE_OTHER): Payer: Medicare Other | Admitting: Ophthalmology

## 2021-02-18 ENCOUNTER — Emergency Department (HOSPITAL_COMMUNITY): Payer: Medicare Other

## 2021-02-18 ENCOUNTER — Emergency Department (HOSPITAL_COMMUNITY)
Admission: EM | Admit: 2021-02-18 | Discharge: 2021-02-18 | Disposition: A | Discharge status: Home / Self Care | Payer: Medicare Other | Attending: Emergency Medicine | Admitting: Emergency Medicine

## 2021-02-18 DIAGNOSIS — Z20822 Contact with and (suspected) exposure to covid-19: Secondary | ICD-10-CM | POA: Insufficient documentation

## 2021-02-18 DIAGNOSIS — G319 Degenerative disease of nervous system, unspecified: Secondary | ICD-10-CM | POA: Diagnosis not present

## 2021-02-18 DIAGNOSIS — R7309 Other abnormal glucose: Secondary | ICD-10-CM | POA: Diagnosis not present

## 2021-02-18 DIAGNOSIS — R55 Syncope and collapse: Secondary | ICD-10-CM | POA: Diagnosis not present

## 2021-02-18 DIAGNOSIS — Z743 Need for continuous supervision: Secondary | ICD-10-CM | POA: Diagnosis not present

## 2021-02-18 DIAGNOSIS — W19XXXA Unspecified fall, initial encounter: Secondary | ICD-10-CM | POA: Diagnosis not present

## 2021-02-18 DIAGNOSIS — M4312 Spondylolisthesis, cervical region: Secondary | ICD-10-CM | POA: Diagnosis not present

## 2021-02-18 DIAGNOSIS — Z87891 Personal history of nicotine dependence: Secondary | ICD-10-CM | POA: Diagnosis not present

## 2021-02-18 DIAGNOSIS — I739 Peripheral vascular disease, unspecified: Secondary | ICD-10-CM | POA: Diagnosis not present

## 2021-02-18 DIAGNOSIS — R410 Disorientation, unspecified: Secondary | ICD-10-CM | POA: Insufficient documentation

## 2021-02-18 DIAGNOSIS — R0902 Hypoxemia: Secondary | ICD-10-CM | POA: Insufficient documentation

## 2021-02-18 DIAGNOSIS — R6889 Other general symptoms and signs: Secondary | ICD-10-CM | POA: Diagnosis not present

## 2021-02-18 DIAGNOSIS — R0602 Shortness of breath: Secondary | ICD-10-CM | POA: Diagnosis not present

## 2021-02-18 DIAGNOSIS — Z8541 Personal history of malignant neoplasm of cervix uteri: Secondary | ICD-10-CM | POA: Diagnosis not present

## 2021-02-18 DIAGNOSIS — M2578 Osteophyte, vertebrae: Secondary | ICD-10-CM | POA: Diagnosis not present

## 2021-02-18 DIAGNOSIS — R404 Transient alteration of awareness: Secondary | ICD-10-CM | POA: Diagnosis not present

## 2021-02-18 DIAGNOSIS — S199XXA Unspecified injury of neck, initial encounter: Secondary | ICD-10-CM | POA: Diagnosis not present

## 2021-02-18 LAB — CBC WITH DIFFERENTIAL/PLATELET
Abs Immature Granulocytes: 0.04 10*3/uL (ref 0.00–0.07)
Basophils Absolute: 0 10*3/uL (ref 0.0–0.1)
Basophils Relative: 0 %
Eosinophils Absolute: 0 10*3/uL (ref 0.0–0.5)
Eosinophils Relative: 0 %
HCT: 41.4 % (ref 36.0–46.0)
Hemoglobin: 13.2 g/dL (ref 12.0–15.0)
Immature Granulocytes: 0 %
Lymphocytes Relative: 16 %
Lymphs Abs: 1.7 10*3/uL (ref 0.7–4.0)
MCH: 30.2 pg (ref 26.0–34.0)
MCHC: 31.9 g/dL (ref 30.0–36.0)
MCV: 94.7 fL (ref 80.0–100.0)
Monocytes Absolute: 0.9 10*3/uL (ref 0.1–1.0)
Monocytes Relative: 9 %
Neutro Abs: 7.8 10*3/uL — ABNORMAL HIGH (ref 1.7–7.7)
Neutrophils Relative %: 75 %
Platelets: 266 10*3/uL (ref 150–400)
RBC: 4.37 MIL/uL (ref 3.87–5.11)
RDW: 14.2 % (ref 11.5–15.5)
WBC: 10.5 10*3/uL (ref 4.0–10.5)
nRBC: 0 % (ref 0.0–0.2)

## 2021-02-18 LAB — COMPREHENSIVE METABOLIC PANEL
ALT: 26 U/L (ref 0–44)
AST: 32 U/L (ref 15–41)
Albumin: 3.9 g/dL (ref 3.5–5.0)
Alkaline Phosphatase: 63 U/L (ref 38–126)
Anion gap: 11 (ref 5–15)
BUN: 30 mg/dL — ABNORMAL HIGH (ref 8–23)
CO2: 27 mmol/L (ref 22–32)
Calcium: 11.7 mg/dL — ABNORMAL HIGH (ref 8.9–10.3)
Chloride: 104 mmol/L (ref 98–111)
Creatinine, Ser: 0.95 mg/dL (ref 0.44–1.00)
GFR, Estimated: >60 mL/min (ref >60)
Glucose, Bld: 120 mg/dL — ABNORMAL HIGH (ref 70–99)
Potassium: 4.1 mmol/L (ref 3.5–5.1)
Sodium: 142 mmol/L (ref 135–145)
Total Bilirubin: 1 mg/dL (ref 0.3–1.2)
Total Protein: 7.5 g/dL (ref 6.5–8.1)

## 2021-02-18 LAB — BLOOD GAS, VENOUS
Acid-base deficit: 10.3 mmol/L — ABNORMAL HIGH (ref 0.0–2.0)
Bicarbonate: 13.7 mmol/L — ABNORMAL LOW (ref 20.0–28.0)
FIO2: 21
O2 Saturation: 31.8 %
Patient temperature: 98.6
pCO2, Ven: 23.8 mmHg — ABNORMAL LOW (ref 44.0–60.0)
pH, Ven: 7.378 (ref 7.250–7.430)
pO2, Ven: <31 mmHg — CL (ref 32.0–45.0)

## 2021-02-18 LAB — URINALYSIS, ROUTINE W REFLEX MICROSCOPIC
Bilirubin Urine: NEGATIVE
Glucose, UA: NEGATIVE mg/dL
Hgb urine dipstick: NEGATIVE
Ketones, ur: NEGATIVE mg/dL
Nitrite: NEGATIVE
Protein, ur: NEGATIVE mg/dL
Specific Gravity, Urine: 1.01 (ref 1.005–1.030)
pH: 7 (ref 5.0–8.0)

## 2021-02-18 LAB — RESP PANEL BY RT-PCR (FLU A&B, COVID) ARPGX2
Influenza A by PCR: NEGATIVE
Influenza B by PCR: NEGATIVE
SARS Coronavirus 2 by RT PCR: NEGATIVE

## 2021-02-18 LAB — CBG MONITORING, ED: Glucose-Capillary: 137 mg/dL — ABNORMAL HIGH (ref 70–99)

## 2021-02-18 MED ORDER — SODIUM CHLORIDE 0.9 % IV BOLUS
1000.0000 mL | Freq: Once | INTRAVENOUS | Status: AC
  Administered 2021-02-18: 1000 mL via INTRAVENOUS

## 2021-02-18 NOTE — ED Notes (Signed)
Pt has been very weak and is not able to keep eyes open when talking with her. Sister states pt has baseline confusion. Pt lives alone. Pt sister is attempting to take pt home to try to care for her. Pt unable to complete any ADLs  while at Emergency room. MD aware

## 2021-02-18 NOTE — Discharge Instructions (Signed)
Your CT scan of your C-spine does not show any broken bones.  Please follow-up with your family doctor in the office.  I put orders in for someone to contact you to try and see if they can get you help and/or physical therapy at home.

## 2021-02-18 NOTE — ED Provider Notes (Addendum)
[facility] DEPT Provider Note   CSN: [id number] Arrival date & time: 02/18/21  [id number]     History No chief complaint on file.   Jody Moore is a 80 y.o. female.  80 yo F with a cc of a fall.  The patient apparently has been having issues getting around at home.  Based on her medical record review she has had some trouble recently and has not really been eating as well as she was previously.  She tells me that today she got her feet tangled up in something that was under a table and she fell.  She denies any injury in the fall.  She thinks she fell onto her stomach.  Per EMS she was more weak than normal and this was also endorsed by the family.  There is some concern with hypoxia and she was started on oxygen.  On further record review the patient is also supposed to be on 2 L of oxygen at all times.  She denies using any oxygen at home.  The history is provided by the patient.  Illness Severity:  Mild Onset quality:  Gradual Duration:  1 day Timing:  Constant Progression:  Unchanged Chronicity:  New Associated symptoms: no chest pain, no congestion, no fever, no headaches, no myalgias, no nausea, no rhinorrhea, no shortness of breath, no vomiting and no wheezing       Past Medical History:  Diagnosis Date   Cervical cancer ([place])    Depression    Essential tremor 10/04/2013   Femur fracture ([place]) 12/22/2016   Femur fracture, right ([place]) 12/21/2016   Gastroesophageal reflux disease without esophagitis 07/10/2015   Macular degeneration of both eyes 07/10/2015   Osteoporosis 07/10/2015   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   Reflux    Severe episode of recurrent major depressive disorder, 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/10/2015   Severe protein-calorie malnutrition ([place]) 12/29/2016   Situational anxiety 07/29/2016   Traumatic compression fracture of T11 thoracic vertebra, with routine healing, subsequent encounter 12/29/2016     Patient Active Problem List   Diagnosis Date Noted   Depression 02/16/2017   Closed fracture of lower end of right femur with routine healing 01/20/2017   Traumatic compression fracture of T11 thoracic vertebra, with routine healing, subsequent encounter 12/29/2016   Acute blood loss as cause of postoperative anemia 12/29/2016   Severe protein-calorie malnutrition ([place]) 12/29/2016   Allergic drug rash due to anti-infective agent 12/29/2016   Femur fracture ([place]) 12/22/2016   Femur fracture, right ([place]) 12/21/2016   Prediabetes 07/29/2016   Situational anxiety 07/29/2016   Gastroesophageal reflux disease without esophagitis 07/10/2015   Severe episode of recurrent major depressive disorder, without psychotic features ([place]) 07/10/2015   Osteoporosis 07/10/2015   Macular degeneration of both eyes 07/10/2015   Essential tremor 10/04/2013   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     Family History  Problem Relation Age of Onset   Heart disease Father    Bladder Cancer Father    Diabetes Father     Social History   Tobacco Use   Smoking status: Former    Packs/day: 2.00    Types: Cigarettes    Quit date: 12/2016    Years since quitting: 4.2   Smokeless tobacco: Never  Vaping Use   Vaping Use: Every day  Substance Use Topics   Alcohol use: No   Drug use: No    Home Medications Prior to Admission  medications   Medication Sig Start Date End Date Taking? Authorizing Provider  gabapentin (NEURONTIN) 100 MG capsule Take 100 mg by mouth in the morning and at bedtime. 02/03/21   [provider]  ibuprofen (ADVIL,MOTRIN) 200 MG tablet Take 200 mg by mouth every 6 (six) hours as needed for headache or mild pain.    [provider]  megestrol (MEGACE) 20 MG tablet Take 1 tablet (20 mg total) by mouth daily. 01/24/21   Nafziger, Tommi Rumps, NP  OLANZapine (ZYPREXA) 10 MG tablet TAKE 1 TABLET(10 MG) BY MOUTH AT BEDTIME Patient taking differently: Take 10 mg by mouth at bedtime. 01/27/21   Nafziger, Tommi Rumps, NP  PARoxetine (PAXIL) 40 MG tablet TAKE 1 TABLET(40 MG) BY MOUTH DAILY Patient taking differently: Take 40 mg by mouth daily. 12/11/20   Nafziger, Tommi Rumps, NP  predniSONE (DELTASONE) 5 MG tablet Take 1 tablet (5 mg total) by mouth daily with breakfast. Start after taper Patient taking differently: Take 5 mg by mouth daily with breakfast. 11/05/20   Nafziger, Tommi Rumps, NP  QUEtiapine (SEROQUEL) 100 MG tablet TAKE 1 TABLET(100 MG) BY MOUTH AT BEDTIME Patient taking differently: Take 100 mg by mouth at bedtime. 12/05/20   Nafziger, Tommi Rumps, NP  Vitamin D, Ergocalciferol, (DRISDOL) 1.25 MG (50000 UNIT) CAPS capsule Take 1 capsule (50,000 Units total) by mouth every 7 (seven) days. 12/07/19   Nafziger, Tommi Rumps, NP    Allergies    Influenza vaccines, Omeprazole, and Codeine sulfate  Review of Systems   Review of Systems  Constitutional:  Negative for chills and fever.  HENT:  Negative for congestion and rhinorrhea.   Eyes:  Negative for redness and visual disturbance.  Respiratory:  Negative for shortness of breath and wheezing.   Cardiovascular:  Negative for chest pain and palpitations.  Gastrointestinal:  Negative for nausea and vomiting.  Genitourinary:  Negative for dysuria and urgency.  Musculoskeletal:  Negative for arthralgias and myalgias.  Skin:  Negative for pallor and wound.  Neurological:   Negative for dizziness and headaches.   Physical Exam Updated Vital Signs BP 137/66   Pulse 81   Temp 98 F (36.7 C)   Resp (!) 36   SpO2 90%   Physical Exam Vitals and nursing note reviewed.  Constitutional:      General: She is not in acute distress.    Appearance: She is well-developed. She is not diaphoretic.     Comments: Cachectic, chronically ill-appearing.  HENT:     Head: Normocephalic and atraumatic.  Eyes:     Pupils: Pupils are equal, round, and reactive to light.  Cardiovascular:     Rate and Rhythm: Normal rate and regular rhythm.     Heart sounds: No murmur heard.   No friction rub. No gallop.  Pulmonary:     Effort: Pulmonary effort is normal.  Breath sounds: No wheezing or rales.  Abdominal:     General: There is no distension.     Palpations: Abdomen is soft.     Tenderness: There is no abdominal tenderness.  Musculoskeletal:        General: Deformity present. No tenderness.     Cervical back: Normal range of motion and neck supple.     Comments: Shortened right lower extremity.  No pain with internal and external rotation of either leg.  No pelvic instability or discomfort on compression.  Palpated from head to toe without any noted areas of bony tenderness.  Skin:    General: Skin is warm and dry.  Neurological:     Mental Status: She is alert and oriented to person, place, and time.  Psychiatric:        Behavior: Behavior normal.    ED Results / Procedures / Treatments   Labs (all labs ordered are listed, but only abnormal results are displayed) Labs Reviewed  URINALYSIS, ROUTINE W REFLEX MICROSCOPIC - Abnormal; Notable for the following components:      Result Value   APPearance CLOUDY (*)    Leukocytes,Ua TRACE (*)    Bacteria, UA RARE (*)    All other components within normal limits  CBC WITH DIFFERENTIAL/PLATELET - Abnormal; Notable for the following components:   Neutro Abs 7.8 (*)    All other components within normal limits   COMPREHENSIVE METABOLIC PANEL - Abnormal; Notable for the following components:   Glucose, Bld 120 (*)    BUN 30 (*)    Calcium 11.7 (*)    All other components within normal limits  BLOOD GAS, VENOUS - Abnormal; Notable for the following components:   pCO2, Ven 23.8 (*)    pO2, Ven <31.0 (*)    Bicarbonate 13.7 (*)    Acid-base deficit 10.3 (*)    All other components within normal limits  CBG MONITORING, ED - Abnormal; Notable for the following components:   Glucose-Capillary 137 (*)    All other components within normal limits  RESP PANEL BY RT-PCR (FLU A&B, COVID) [id number]  URINE CULTURE    EKG EKG Interpretation  Date/Time:  Tuesday February 18 2021 11:11:50 EDT Ventricular Rate:  77 PR Interval:  116 QRS Duration: 93 QT Interval:  359 QTC Calculation: 407 R Axis:   -32 Text Interpretation: Sinus rhythm Borderline short PR interval Biatrial enlargement Left axis deviation Abnormal R-wave progression, early transition Otherwise no significant change Confirmed by Deno Etienne [phone]) on 02/18/2021 12:12:41 PM  Radiology CT HEAD WO CONTRAST (5MM)  Result Date: 02/18/2021 CLINICAL DATA:  Delirium, unwitnessed fall EXAM: CT HEAD WITHOUT CONTRAST TECHNIQUE: Contiguous axial images were obtained from the base of the skull through the vertex without intravenous contrast. COMPARISON:  12/21/2016 FINDINGS: Brain: There is atrophy and chronic small vessel disease changes. No acute intracranial abnormality. Specifically, no hemorrhage, hydrocephalus, mass lesion, acute infarction, or significant intracranial injury. Vascular: No hyperdense vessel or unexpected calcification. Skull: No acute calvarial abnormality. Sinuses/Orbits: No acute findings Other: None IMPRESSION: Atrophy, chronic microvascular disease. No acute intracranial abnormality. Electronically Signed   By: Rolm Baptise M.D.   On: 02/18/2021 12:39   CT Cervical Spine Wo Contrast  Result Date: 02/18/2021 CLINICAL DATA:  Neck  trauma. EXAM: CT CERVICAL SPINE WITHOUT CONTRAST TECHNIQUE: Multidetector CT imaging of the cervical spine was performed without intravenous contrast. Multiplanar CT image reconstructions were also generated. COMPARISON:  Cervical spine CT 12/21/2016. FINDINGS: Alignment: There is 2 mm of  anterolisthesis at C4-C5 which is unchanged from prior examination and favored is degenerative. Alignment is otherwise anatomic. Skull base and vertebrae: The bones are diffusely osteopenic, unchanged. No acute fracture. No primary bone lesion or focal pathologic process. Soft tissues and spinal canal: No prevertebral fluid or swelling. No visible canal hematoma. Disc levels: Disc space narrowing and osteophyte formation is seen throughout the cervical spine most significant at C5-C6 and C6-C7, unchanged. There is no severe central canal or neural foraminal stenosis at any level. Upper chest: Negative. Other: None. IMPRESSION: No acute fracture or traumatic subluxation of the cervical spine. Electronically Signed   By: Ronney Asters M.D.   On: 02/18/2021 15:42   DG Chest Port 1 View  Result Date: 02/18/2021 CLINICAL DATA:  Shortness of breath, increased confusion EXAM: PORTABLE CHEST 1 VIEW COMPARISON:  Prior chest x-ray 08/06/2020 FINDINGS: Chronic elevation of the right hemidiaphragm. Cardiac and mediastinal contours are within normal limits. Chronic bronchitic changes and mild interstitial prominence is similar compared to prior. No new focal airspace infiltrate, pleural effusion or pneumothorax. No acute osseous abnormality. IMPRESSION: Stable chest x-ray without evidence of acute cardiopulmonary process. Electronically Signed   By: Jacqulynn Cadet M.D.   On: 02/18/2021 11:19    Procedures Procedures   Medications Ordered in ED Medications  sodium chloride 0.9 % bolus 1,000 mL (0 mLs Intravenous Stopped 02/18/21 1334)    ED Course  I have reviewed the triage vital signs and the nursing notes.  Pertinent labs &  imaging results that were available during my care of the patient were reviewed by me and considered in my medical decision making (see chart for details).    MDM Rules/Calculators/A&P                           80 yo F with a chief complaints of a fall.  Patient tells me that she got her feet tripped up on something that was under a table.  On record review it appears the patient has had a lot of falls recently.  She has seen her family doctor and there is been concern for malnutrition and she has been started on multiple different medications to try and help with this but no improvement so far.  She also is supposed to be on 2 L of oxygen at home based on her notes though she tells me she is not on oxygen at home.  She is mildly confused here and appears to be mildly confused on the notes in her chart though per EMS the daughter felt she was more confused than typical today.  We will perform an altered mental status work-up.  CT scan of the head blood work chest x-ray UA.  Reassess.  Work-up is largely unremarkable no hypercarbia no significant electrolyte abnormality no significant anemia.  Chest x-ray viewed by me without focal infiltrate.    I discussed results with the patient and family.  Family is concerned about her neck.  He states that she normally does not hold it in an extended position like she is holding it.  We will obtain a CT scan of the C-spine.  This resulted and is negative.  Discussed results with the patient who would like to go home at this time.  Encouraged her to wear her oxygen.  We will have her follow-up with her family doctor.  Patient chronically debilitated, by chart review doesn't seem to have changed significantly, patient given O2 for home  months ago.  Patient requesting to go home, telling me nothing is wrong.  Do not feel its right to hold her against her will in the hospital. Home health orders place, social work consult.   4:42 PM:  I have discussed the  diagnosis/risks/treatment options with the patient and believe the pt to be eligible for discharge home to follow-up with PCP. We also discussed returning to the ED immediately if new or worsening sx occur. We discussed the sx which are most concerning (e.g., sudden worsening pain, fever, inability to tolerate by mouth) that necessitate immediate return. Medications administered to the patient during their visit and any new prescriptions provided to the patient are listed below.  Medications given during this visit Medications  sodium chloride 0.9 % bolus 1,000 mL (0 mLs Intravenous Stopped 02/18/21 1334)     The patient appears reasonably screen and/or stabilized for discharge and I doubt any other medical condition or other [facility] requiring further screening, evaluation, or treatment in the ED at this time prior to discharge.   Final Clinical Impression(s) / ED Diagnoses Final diagnoses:  Fall, initial encounter  Hypoxia    Rx / DC Orders ED Discharge Orders          [place]        02/18/21 1331    Face-to-face encounter (required for Medicare/Medicaid patients)       Comments: I Cecilio Asper certify that this patient is under my care and that I, or a nurse practitioner or physician's assistant working with me, had a face-to-face encounter that meets the physician face-to-face encounter requirements with this patient on 02/18/2021. The encounter with the patient was in whole, or in part for the following medical condition(s) which is the primary reason for home health care (List medical condition): Worsening failure to thrive at home unsafe to leave the house without significant assistance.   02/18/21 [place], [place], DO 02/18/21 [place], DO 02/19/21 [id number]

## 2021-02-18 NOTE — ED Triage Notes (Signed)
Per EMS  pt from home, unwitnessed fall, confusion at baseline but today she is more confused according to sister. Strong smell of urine. RT foot shorting but this is normal due to prior  breakage. Complaints of back pain more than normal. No blood thinners 138/72 H78 [id number] [id number] on 3l NOW 96  18G lLFac

## 2021-02-19 ENCOUNTER — Telehealth (HOSPITAL_COMMUNITY): Payer: Self-pay | Admitting: Emergency Medicine

## 2021-02-19 ENCOUNTER — Encounter: Payer: Self-pay | Admitting: Adult Health

## 2021-02-19 LAB — URINE CULTURE: Culture: NO GROWTH

## 2021-02-19 MED ORDER — ERYTHROMYCIN 5 MG/GM OP OINT: TOPICAL_OINTMENT | OPHTHALMIC | 0 refills | Status: AC

## 2021-02-19 NOTE — Telephone Encounter (Signed)
Erythromycin ointment sent to pharmacy

## 2021-02-25 ENCOUNTER — Other Ambulatory Visit: Payer: Self-pay

## 2021-02-25 ENCOUNTER — Encounter: Payer: Self-pay | Admitting: Adult Health

## 2021-02-25 ENCOUNTER — Ambulatory Visit (INDEPENDENT_AMBULATORY_CARE_PROVIDER_SITE_OTHER): Payer: Medicare Other | Admitting: Adult Health

## 2021-02-25 VITALS — BP 130/62 | HR 83 | Temp 98.5°F | Ht 62.0 in

## 2021-02-25 DIAGNOSIS — R2681 Unsteadiness on feet: Secondary | ICD-10-CM | POA: Diagnosis not present

## 2021-02-25 DIAGNOSIS — R627 Adult failure to thrive: Secondary | ICD-10-CM

## 2021-02-25 MED ORDER — RANITIDINE HCL 300 MG PO TABS: 300.0000 mg | ORAL_TABLET | Freq: Every day | ORAL | 0 refills | Status: AC

## 2021-02-25 NOTE — Progress Notes (Signed)
Subjective:    Patient ID: Jody Moore, female    DOB: 1941/05/20, 80 y.o.   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  She presents with her sister for follow up after being seen in the ER 02/18/2021.  She was seen for a chief complaint of fall at home.  Per ER report she had gotten her feet tangled up and fell onto the ground.  She denied injury in the fall.  She thinks she fell onto her stomach.  Per EMS she was more weak than normal which was also endorsed by her family.  There was some concern for hypoxia and she was started on oxygen by EMS.  On further record review she was supposed to be on 2 L of oxygen at all time but she does not use oxygen at home.  Her work-up did not notice any acute discomfort in the ER, CT head, cervical spine, and chest x-ray without acute abnormality.  She has not been eating much over the last few months, prescription for Megace was sent in but her sister forgot to start her on the medication.  Today she is adamant that this fall was not the result of syncope or hypoxia.  She reports that she was trying to move her mattress on her bed and got tripped up and fell onto the bedroom floor.  She denies any pain or discomfort today and has no acute complaints.  Sister feels as though she is at  baseline.  Review of Systems  Constitutional:  Positive for appetite change.  Respiratory: Negative.    Cardiovascular: Negative.   Gastrointestinal: Negative.   Genitourinary: Negative.   Musculoskeletal:  Positive for arthralgias.  Neurological: Negative.   Hematological: Negative.   All other systems reviewed and are negative. 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   Reflux    Severe episode of recurrent major depressive disorder, without psychotic features ([place]) 07/10/2015   Severe protein-calorie malnutrition ([place]) 12/29/2016   Situational anxiety 07/29/2016   Traumatic compression fracture of T11 thoracic vertebra, with routine healing, subsequent encounter 12/29/2016    Social History   Socioeconomic History   Marital status: Widowed    Spouse name: Not on file   Number of children: 0   Years of education: Not on file   Highest education level: Not on file  Occupational History   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 yes   Marital status:  Married                            What year were you married? 2015   Do you live in a house, apartment, assisted living, condo, trailer, etc)? house   Is it one or more stories? yes   How many persons live in your  home? 2                                                                                               Do you have any pets in your home?  2 cats   Current or past profession: Hospital doctor   Do you exercise?    no                                                 Type & how often:   Do you have a living will?  no   Do you have a DNR Form?  no   Do you have a POA/HPOA forms?  No   Admitted to [facility] 12/25/16      DNR      07/20/18: Lives alone in one-level home, has two cats.   Sister, sylvia, local and assists as needed, but pt. mostly independent with ADLs, IADLs   Relies heavily on sister for social/emotional support   Social Determinants of Health   Financial Resource Strain: Low Risk    Difficulty of Paying Living Expenses: Not hard at all  Food Insecurity: No Food Insecurity   Worried About Charity fundraiser in the Last Year: Never true   Ran Out of Food in the Last Year: Never true  Transportation Needs: No Transportation Needs   Lack of Transportation (Medical): No   Lack of Transportation (Non-Medical): No  Physical Activity: Inactive   Days of Exercise per Week: 0 days   Minutes of Exercise per Session: 0 min  Stress: No Stress Concern Present   Feeling of Stress : Only a little  Social Connections: Socially Isolated   Frequency of Communication with Friends and Family: More than three times a week   Frequency of Social Gatherings with Friends and Family: Once a week   Attends Religious Services: Never   Marine scientist or Organizations: No   Attends Archivist Meetings: Never   Marital Status: Widowed  Human resources officer Violence: Not At Risk   Fear of Current or Ex-Partner: No   Emotionally Abused: No   Physically Abused: No   Sexually Abused: No    Past Surgical History:  Procedure Laterality Date   ANKLE SURGERY     CATARACT EXTRACTION Bilateral  [place]   cancer-radiation    FEMUR SURGERY  2005   fractured  right femur    HIP SURGERY  2006   fractured right hip    IR RADIOLOGIST EVAL & MGMT  01/14/2017   ORIF FEMUR FRACTURE Right 12/22/2016   Procedure: OPEN REDUCTION INTERNAL FIXATION (ORIF) DISTAL FEMUR FRACTURE;  Surgeon: [name], MD;  Location: [facility];  Service: Orthopedics;  Laterality: Right;   TONSILLECTOMY      Family History  Problem Relation Age of Onset   Heart disease Father    Bladder Cancer Father    Diabetes Father     Allergies  Allergen Reactions   Influenza Vaccines Hives    Hives/swelling/injection site war to the touch. No relief with benadryl    Omeprazole Hives and Other (See Comments)   Codeine Sulfate Rash    Current Outpatient Medications on File Prior to Visit  Medication Sig Dispense Refill   erythromycin ophthalmic ointment Place a 1/2 inch ribbon of ointment into the lower eyelid. 3.5 g 0   gabapentin (NEURONTIN) 100 MG capsule Take 100 mg by mouth in the morning and at bedtime.     ibuprofen (ADVIL,MOTRIN) 200 MG tablet Take 200 mg by mouth every 6 (six) hours as needed for headache or mild pain.     megestrol (MEGACE) 20 MG tablet Take 1 tablet (20 mg total) by mouth daily. 30 tablet 0   OLANZapine (ZYPREXA) 10 MG tablet TAKE 1 TABLET(10 MG) BY MOUTH AT BEDTIME (Patient taking differently: Take 10 mg by mouth at bedtime.) 90 tablet 1   PARoxetine (PAXIL) 40 MG tablet TAKE 1 TABLET(40 MG) BY MOUTH DAILY (Patient taking differently: Take 40 mg by mouth daily.) 90 tablet 1   predniSONE (DELTASONE) 5 MG tablet Take 1 tablet (5 mg total) by mouth daily with breakfast. Start after taper (Patient taking differently: Take 5 mg by mouth daily with breakfast.) 90 tablet 1   QUEtiapine (SEROQUEL) 100 MG tablet TAKE 1 TABLET(100 MG) BY MOUTH AT BEDTIME (Patient taking differently: Take 100 mg by mouth at bedtime.) 90 tablet 1   Vitamin D, Ergocalciferol, (DRISDOL) 1.25 MG (50000 UNIT) CAPS capsule Take 1 capsule (50,000 Units total) by mouth every 7 (seven)  days. 12 capsule 1   No current facility-administered medications on file prior to visit.    BP 130/62   Pulse 83   Temp 98.5 F (36.9 C) (Oral)   Ht 5\' 2"  (1.575 m)   SpO2 94%   BMI 16.83 kg/m       Objective:   Physical Exam Vitals and nursing note reviewed.  Constitutional:      Appearance: Normal appearance. She is cachectic. She is ill-appearing (chronically).  Cardiovascular:     Rate and Rhythm: Normal rate and regular rhythm.     Pulses: Normal pulses.     Heart sounds: Normal heart sounds.  Skin:    General: Skin is warm and dry.  Neurological:     General: No focal deficit present.     Mental Status: She is alert and oriented to person, place, and time.     Motor: Weakness present.     Gait: Gait abnormal.     Comments: In wheelchair for exam    Psychiatric:        Mood and Affect: Mood normal.        Behavior: Behavior normal.        Thought Content: Thought content normal.  Judgment: Judgment normal.      Assessment & Plan:   1. Gait instability -Home health PT is being set up, there was an issue with insurance.  Encouraged using assistive device while ambulating.  2. Failure to thrive in adult -We will start Megace and family will let me know how she does with this  [name], NP

## 2021-02-26 ENCOUNTER — Other Ambulatory Visit: Payer: Self-pay | Admitting: Adult Health

## 2021-02-26 DIAGNOSIS — R627 Adult failure to thrive: Secondary | ICD-10-CM

## 2021-02-27 ENCOUNTER — Other Ambulatory Visit: Payer: Self-pay | Admitting: Adult Health

## 2021-02-27 DIAGNOSIS — F22 Delusional disorders: Secondary | ICD-10-CM

## 2021-02-28 ENCOUNTER — Encounter: Payer: Self-pay | Admitting: Adult Health

## 2021-02-28 ENCOUNTER — Other Ambulatory Visit: Payer: Self-pay | Admitting: Adult Health

## 2021-02-28 DIAGNOSIS — R627 Adult failure to thrive: Secondary | ICD-10-CM

## 2021-02-28 MED ORDER — MEGESTROL ACETATE 20 MG PO TABS: 20.0000 mg | ORAL_TABLET | Freq: Every day | ORAL | 0 refills | Status: DC

## 2021-03-01 ENCOUNTER — Encounter: Payer: Self-pay | Admitting: Adult Health

## 2021-03-03 ENCOUNTER — Telehealth: Payer: Self-pay

## 2021-03-03 ENCOUNTER — Encounter: Payer: Self-pay | Admitting: Adult Health

## 2021-03-03 DIAGNOSIS — R627 Adult failure to thrive: Secondary | ICD-10-CM

## 2021-03-03 NOTE — Telephone Encounter (Signed)
Please advise. Zyrtec is not on the med list. Ok to send Rx or should this be OTC?

## 2021-03-03 NOTE — Telephone Encounter (Signed)
Sister of patient called wanting to make sure virtual appt is ok for 9/27 and would like a call back.

## 2021-03-03 NOTE — Telephone Encounter (Signed)
Jody Moore has questions re: virtual visit vs in person appt; would like visit to be in person. Visit converted to in person at same time. Jody Moore denies that the patient has covid symptoms.

## 2021-03-04 ENCOUNTER — Other Ambulatory Visit: Payer: Self-pay

## 2021-03-04 ENCOUNTER — Encounter: Payer: Self-pay | Admitting: Adult Health

## 2021-03-04 ENCOUNTER — Ambulatory Visit (INDEPENDENT_AMBULATORY_CARE_PROVIDER_SITE_OTHER): Payer: Medicare Other | Admitting: Adult Health

## 2021-03-04 VITALS — BP 100/70 | HR 85 | Temp 98.5°F | Ht 62.0 in | Wt 92.0 lb

## 2021-03-04 DIAGNOSIS — R110 Nausea: Secondary | ICD-10-CM

## 2021-03-04 DIAGNOSIS — R2681 Unsteadiness on feet: Secondary | ICD-10-CM

## 2021-03-04 DIAGNOSIS — G250 Essential tremor: Secondary | ICD-10-CM

## 2021-03-04 MED ORDER — MEGESTROL ACETATE 20 MG PO TABS: 20.0000 mg | ORAL_TABLET | Freq: Every day | ORAL | 0 refills | Status: AC

## 2021-03-04 MED ORDER — GABAPENTIN 100 MG PO CAPS: 100.0000 mg | ORAL_CAPSULE | Freq: Three times a day (TID) | ORAL | 1 refills | Status: AC

## 2021-03-04 MED ORDER — ONDANSETRON HCL 4 MG PO TABS: 4.0000 mg | ORAL_TABLET | Freq: Three times a day (TID) | ORAL | 0 refills | Status: AC | PRN

## 2021-03-04 NOTE — Progress Notes (Signed)
Subjective:    Patient ID: Jody Moore, female    DOB: 1940/10/29, 80 y.o.   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  She presents to the office today with her sister.   Over the last few weeks she has been having worsening upper extremity tremors to the point where it is hard for her to hold eating utensils or drinks.  In the past she has been on gabapentin 100 mg BID - TID.  She did very well with this dose but for some reason stopped taking the medication and never requested a refill.  Additionally she is also been experiencing some nausea intermittently and needs a refill of Zofran  Not picked up her prescription for Megace yet but plans to do so today and start this Today or tomorrow to see if we can stimulate her appetite  Home health is not reached out yet to help with strengthening exercises.  Looks like the referral was closed as it was sent to somebody out of network.  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   Reflux    Severe episode of recurrent major depressive disorder, without psychotic features ([place]) 07/10/2015   Severe protein-calorie malnutrition ([place]) 12/29/2016   Situational anxiety 07/29/2016   Traumatic compression fracture of T11 thoracic vertebra, with routine healing, subsequent encounter 12/29/2016    Social History   Socioeconomic History   Marital status: Widowed    Spouse name: Not on file   Number of children: 0   Years of education: Not on file   Highest education level: Not on file  Occupational History   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 yes   Marital status:  Married                            What year were you married? 2015   Do you live in a house, apartment, assisted living, condo, trailer, etc)? house   Is it one or more stories? yes   How many persons live in your home? 2  Do you have any pets in your home?  2 cats   Current or past profession: Hospital doctor   Do you exercise?    no                                                 Type & how often:   Do you have a living will?  no   Do you have a DNR Form?  no   Do you have a POA/HPOA forms?  No   Admitted to [facility] 12/25/16      DNR      07/20/18: Lives alone in one-level home, has two cats.   Sister, sylvia, local and assists as needed, but pt. mostly independent with ADLs, IADLs   Relies heavily on  sister for social/emotional support   Social Determinants of Health   Financial Resource Strain: Low Risk    Difficulty of Paying Living Expenses: Not hard at all  Food Insecurity: No Food Insecurity   Worried About Charity fundraiser in the Last Year: Never true   Ran Out of Food in the Last Year: Never true  Transportation Needs: No Transportation Needs   Lack of Transportation (Medical): No   Lack of Transportation (Non-Medical): No  Physical Activity: Inactive   Days of Exercise per Week: 0 days   Minutes of Exercise per Session: 0 min  Stress: No Stress Concern Present   Feeling of Stress : Only a little  Social Connections: Socially Isolated   Frequency of Communication with Friends and Family: More than three times a week   Frequency of Social Gatherings with Friends and Family: Once a week   Attends Religious Services: Never   Marine scientist or Organizations: No   Attends Archivist Meetings: Never   Marital Status: Widowed  Human resources officer Violence: Not At Risk   Fear of Current or Ex-Partner: No   Emotionally Abused: No   Physically Abused: No   Sexually Abused: No    Past Surgical History:  Procedure Laterality Date   ANKLE SURGERY     CATARACT EXTRACTION Bilateral    CERVIX SURGERY  1980   cancer-radiation    FEMUR SURGERY  2005   fractured right femur    HIP SURGERY  2006   fractured right hip    IR RADIOLOGIST EVAL & MGMT  01/14/2017   ORIF FEMUR FRACTURE Right 12/22/2016   Procedure: OPEN REDUCTION INTERNAL FIXATION (ORIF) DISTAL FEMUR FRACTURE;  Surgeon: [name], MD;  Location: [facility];  Service: Orthopedics;  Laterality: Right;   TONSILLECTOMY      Family History  Problem Relation Age of Onset   Heart disease Father    Bladder Cancer Father    Diabetes Father     Allergies  Allergen Reactions   Influenza Vaccines Hives    Hives/swelling/injection site war to the touch. No relief with benadryl    Omeprazole Hives and  Other (See Comments)   Codeine Sulfate Rash    Current Outpatient Medications on File Prior to Visit  Medication Sig Dispense Refill   erythromycin ophthalmic ointment Place a 1/2 inch ribbon of ointment into the lower eyelid. 3.5 g 0   ibuprofen (ADVIL,MOTRIN) 200 MG tablet Take 200 mg by mouth every 6 (six) hours as needed for headache or mild  pain.     megestrol (MEGACE) 20 MG tablet Take 1 tablet (20 mg total) by mouth daily. 90 tablet 0   OLANZapine (ZYPREXA) 10 MG tablet TAKE 1 TABLET(10 MG) BY MOUTH AT BEDTIME (Patient taking differently: Take 10 mg by mouth at bedtime.) 90 tablet 1   PARoxetine (PAXIL) 40 MG tablet TAKE 1 TABLET(40 MG) BY MOUTH DAILY (Patient taking differently: Take 40 mg by mouth daily.) 90 tablet 1   predniSONE (DELTASONE) 5 MG tablet Take 1 tablet (5 mg total) by mouth daily with breakfast. Start after taper (Patient taking differently: Take 5 mg by mouth daily with breakfast.) 90 tablet 1   QUEtiapine (SEROQUEL) 100 MG tablet TAKE 1 TABLET(100 MG) BY MOUTH AT BEDTIME 90 tablet 1   ranitidine (ZANTAC) 300 MG tablet Take 1 tablet (300 mg total) by mouth at bedtime. 90 tablet 0   Vitamin D, Ergocalciferol, (DRISDOL) 1.25 MG (50000 UNIT) CAPS capsule Take 1 capsule (50,000 Units total) by mouth every 7 (seven) days. 12 capsule 1   No current facility-administered medications on file prior to visit.    BP 100/70   Pulse 85   Temp 98.5 F (36.9 C) (Oral)   Ht 5\' 2"  (1.575 m)   Wt 92 lb (41.7 kg)   SpO2 91%   BMI 16.83 kg/m       Objective:   Physical Exam Vitals and nursing note reviewed.  Constitutional:      Appearance: She is cachectic.  Cardiovascular:     Rate and Rhythm: Normal rate and regular rhythm.     Pulses: Normal pulses.     Heart sounds: Normal heart sounds.  Pulmonary:     Effort: Pulmonary effort is normal.     Breath sounds: Normal breath sounds.  Skin:    General: Skin is warm and dry.     Capillary Refill: Capillary refill takes  less than 2 seconds.  Neurological:     General: No focal deficit present.     Mental Status: She is oriented to person, place, and time.     Motor: Weakness and tremor present.     Coordination: Coordination is intact.     Gait: Gait abnormal (in wheelchair during exam).     Comments: BLE essential tremor. Worse on left then right   Psychiatric:        Mood and Affect: Mood normal.        Behavior: Behavior normal.        Thought Content: Thought content normal.      Assessment & Plan:   1. Essential tremor - Restart on Gabapentin as she did well with this in the past.  - gabapentin (NEURONTIN) 100 MG capsule; Take 1 capsule (100 mg total) by mouth 3 (three) times daily.  Dispense: 270 capsule; Refill: 1  2. Nausea  - ondansetron (ZOFRAN) 4 MG tablet; Take 1 tablet (4 mg total) by mouth every 8 (eight) hours as needed for nausea or vomiting.  Dispense: 20 tablet; Refill: 0  3. Gait instability  - Ambulatory referral to [facility], NP

## 2021-03-05 NOTE — Telephone Encounter (Signed)
This is Cory's patient  

## 2021-03-11 ENCOUNTER — Encounter: Payer: Self-pay | Admitting: Adult Health

## 2021-03-11 ENCOUNTER — Other Ambulatory Visit: Payer: Self-pay | Admitting: Adult Health

## 2021-03-11 DIAGNOSIS — R627 Adult failure to thrive: Secondary | ICD-10-CM

## 2021-03-11 NOTE — Telephone Encounter (Signed)
Please advise 

## 2021-03-18 ENCOUNTER — Telehealth: Payer: Self-pay

## 2021-03-19 ENCOUNTER — Telehealth: Payer: Self-pay | Admitting: Adult Health

## 2021-03-19 DIAGNOSIS — J441 Chronic obstructive pulmonary disease with (acute) exacerbation: Secondary | ICD-10-CM | POA: Diagnosis not present

## 2021-03-19 NOTE — Telephone Encounter (Signed)
Spoke to patients sister Sunday Spillers) and expressed my condolences.

## 2021-04-08 ENCOUNTER — Ambulatory Visit: Payer: Medicare Other

## 2021-04-08 NOTE — Telephone Encounter (Signed)
FYI

## 2021-04-08 NOTE — Telephone Encounter (Signed)
Jody Moore from [facility] called to inform PCP patient died @ 8:58 am

## 2021-04-08 DEATH — deceased

## 2022-03-09 IMAGING — CT CT HEAD W/O CM
3 of 5 series · 14 of 47 positions shown, 16 images · non-contrast
Comparison: 12/21/2016

CLINICAL DATA: Delirium, unwitnessed fall

EXAM:
CT HEAD WITHOUT CONTRAST
TECHNIQUE: Contiguous axial images were obtained from the base of the skull
through the vertex without intravenous contrast.

[Series 4: coronal soft tissue · coronal · 0.38mm/px · 3 of 68 slices shown]
[im 23/68  brain]
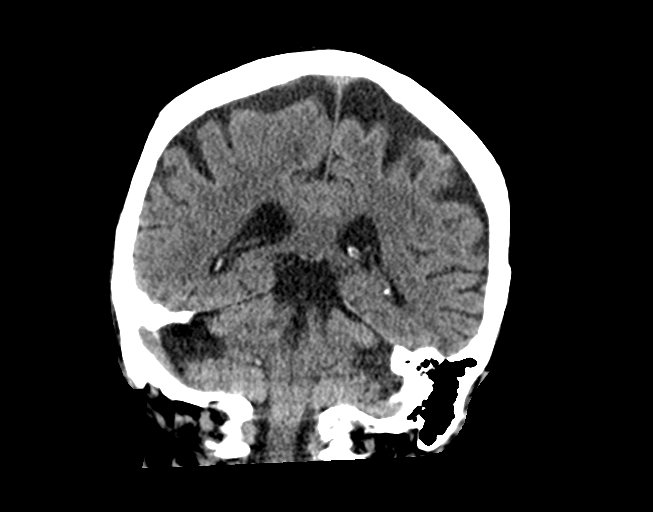
[im 30/68  brain]
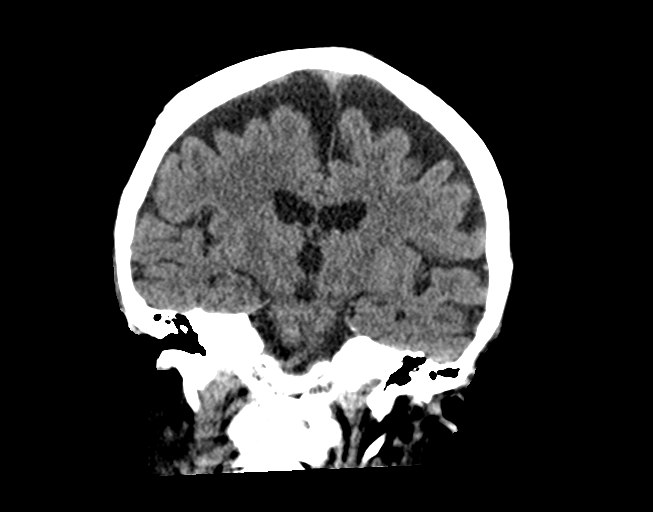
[im 38/68  brain]
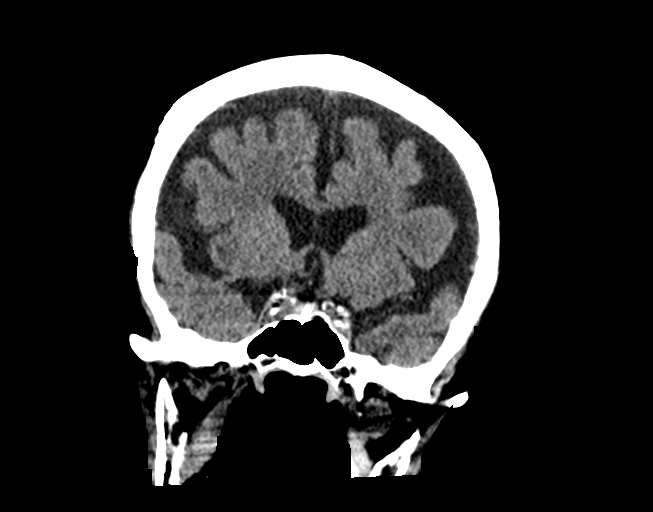

[Series 5: sagittal soft tissue · sagittal · 0.39mm/px · 3 of 54 slices shown]
[im 19/54  brain]
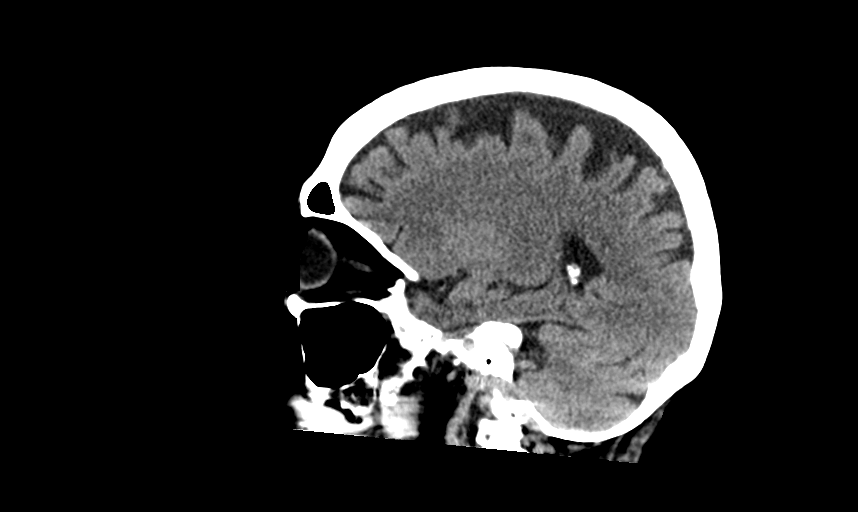
[im 27/54  brain]
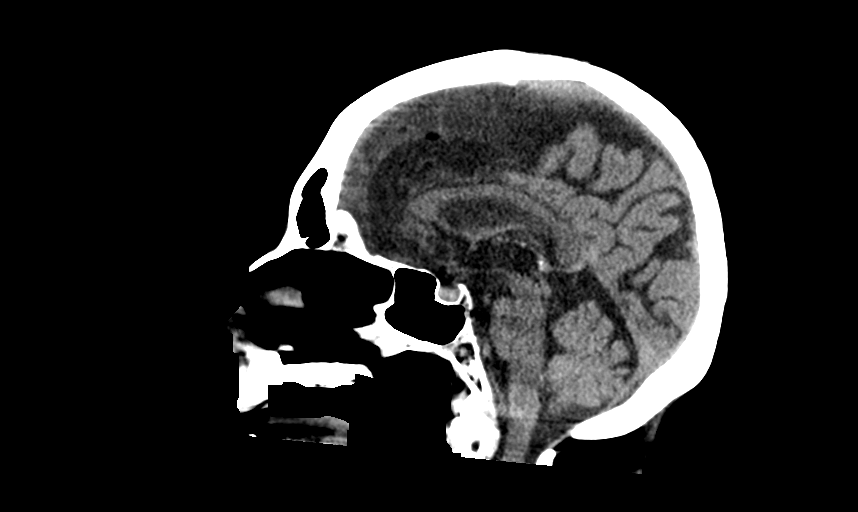
[im 35/54  brain]
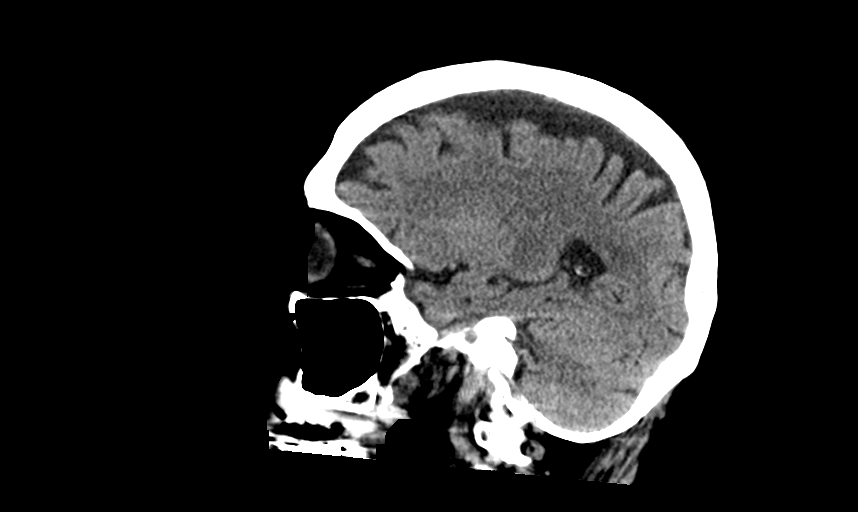

[Series 7: ang.axial soft tissue · axial · 0.56mm/px · z∈[-110,+10]mm · 8 of 53 slices shown, 10 images]
[im 6/53  brain]
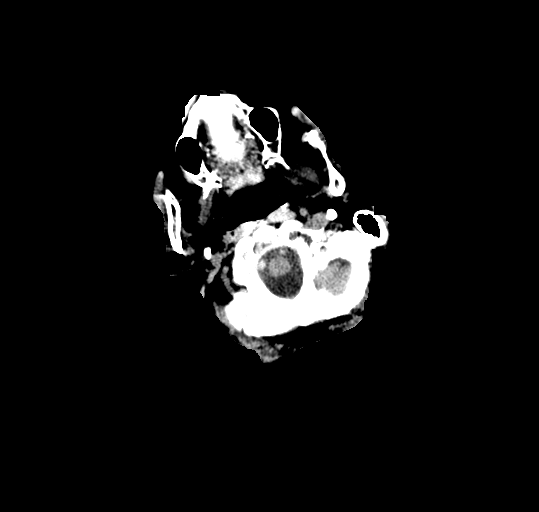
[im 6/53  bone]
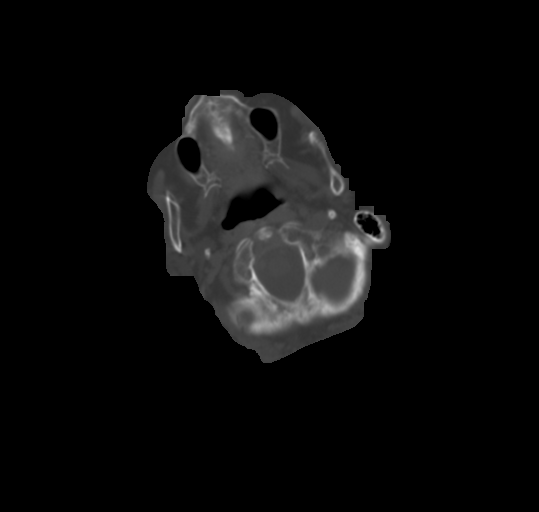
[im 12/53  brain]
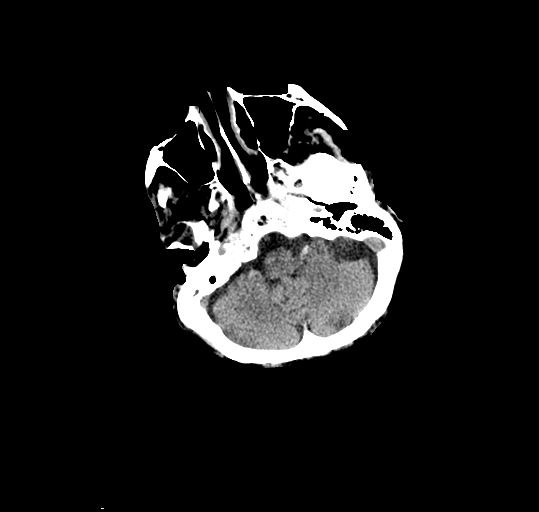
[im 18/53  brain]
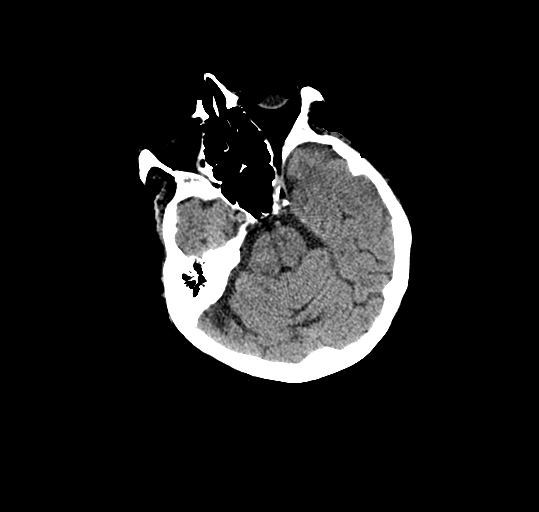
[im 24/53  brain]
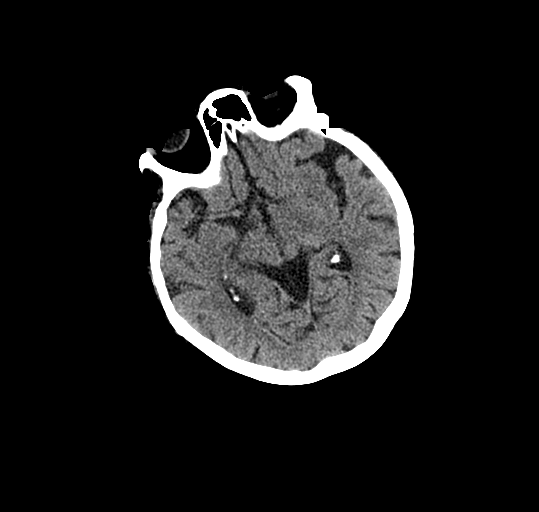
[im 29/53  brain]
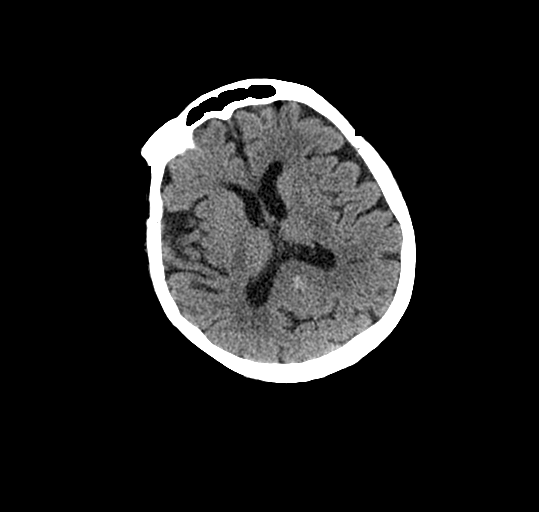
[im 29/53  bone]
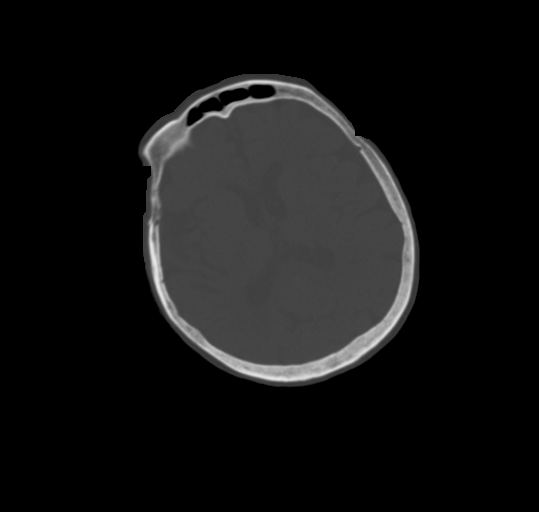
[im 35/53  brain]
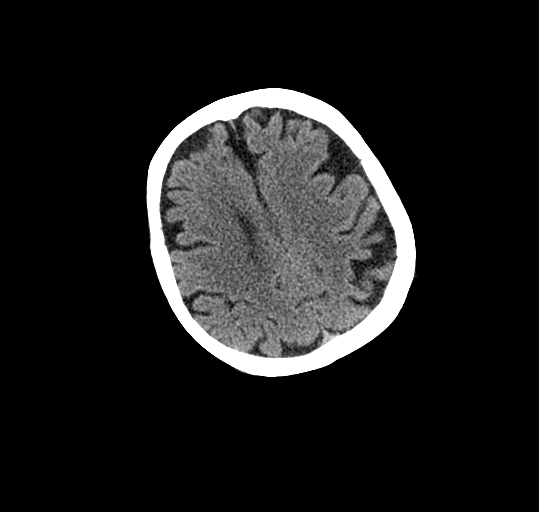
[im 41/53  brain]
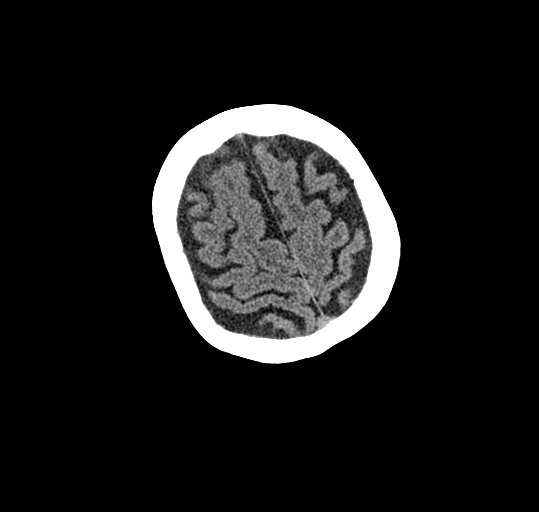
[im 47/53  brain]
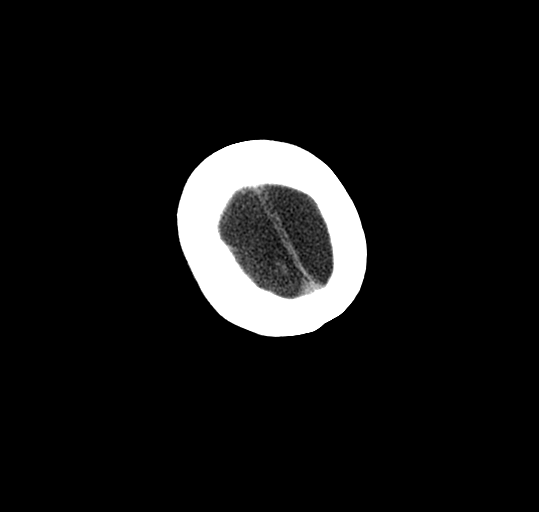

[14 of 47 positions shown; findings below may reference images not displayed]

FINDINGS: Brain: There is atrophy and chronic small vessel disease changes. No
acute intracranial abnormality. Specifically, no hemorrhage,
hydrocephalus, mass lesion, acute infarction, or significant
intracranial injury.

Vascular: No hyperdense vessel or unexpected calcification.

Skull: No acute calvarial abnormality.

Sinuses/Orbits: No acute findings

Other: None
IMPRESSION: Atrophy, chronic microvascular disease.

No acute intracranial abnormality.

## 2022-03-09 IMAGING — CT CT CERVICAL SPINE W/O CM
3 of 4 series · 12 of 33 positions shown, 14 images · non-contrast
Comparison: Cervical spine CT 12/21/2016.

CLINICAL DATA: Neck trauma.

EXAM:
CT CERVICAL SPINE WITHOUT CONTRAST
TECHNIQUE: Multidetector CT imaging of the cervical spine was performed without
intravenous contrast. Multiplanar CT image reconstructions were also
generated.

[Series 6: orthogonal bone · axial · 0.23mm/px · z∈[+1383,+1491]mm · 4 of 87 slices shown, 5 images]
[im 13/87  soft-tissue]
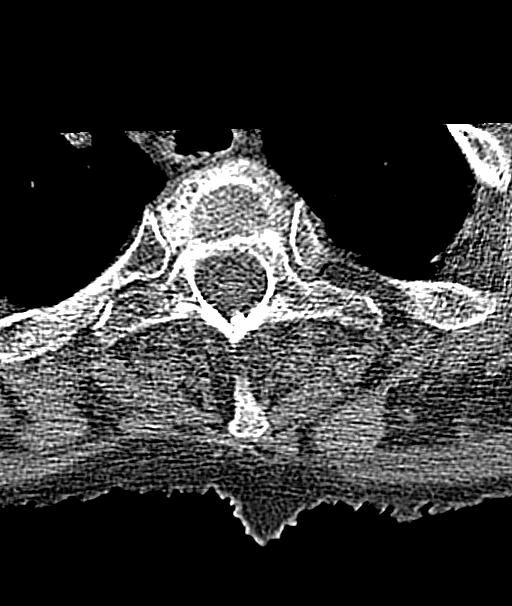
[im 13/87  bone]
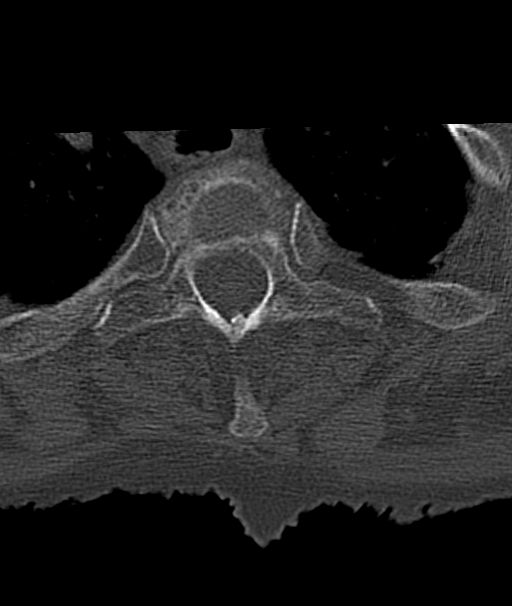
[im 37/87  bone]
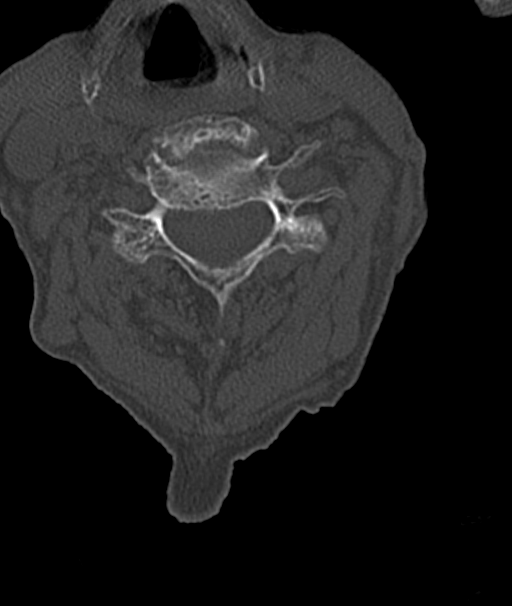
[im 50/87  bone]
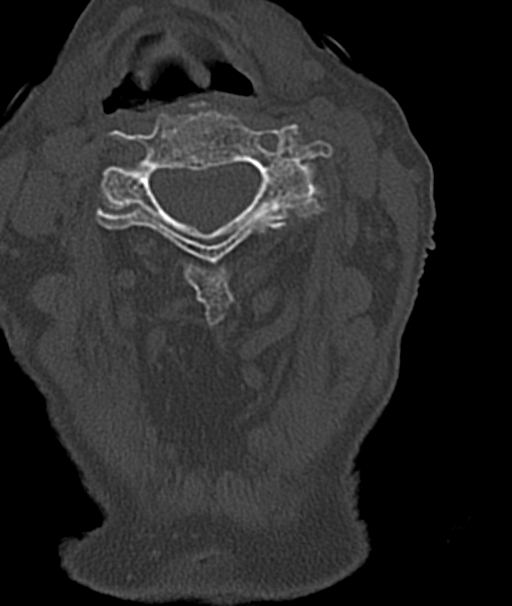
[im 74/87  bone]
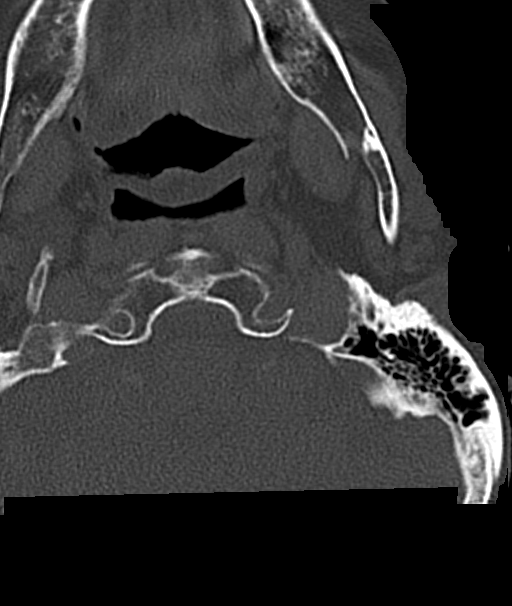

[Series 7: coronal bone · coronal · 0.25mm/px · 3 of 66 slices shown]
[im 19/66  bone]
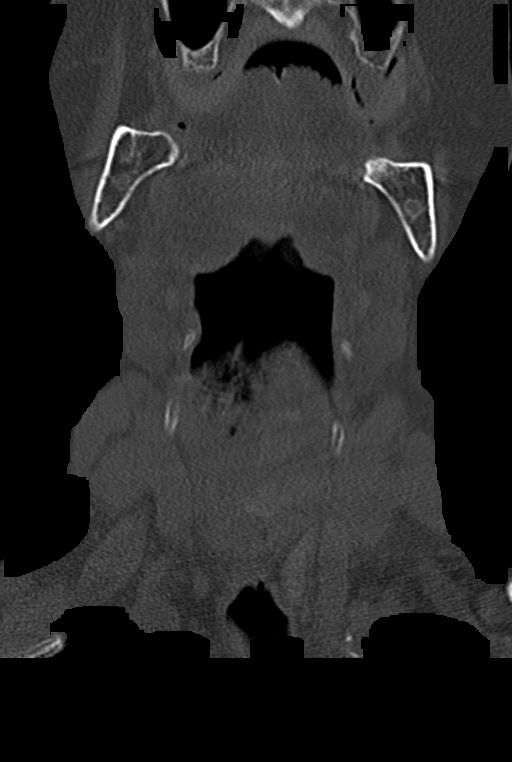
[im 28/66  bone]
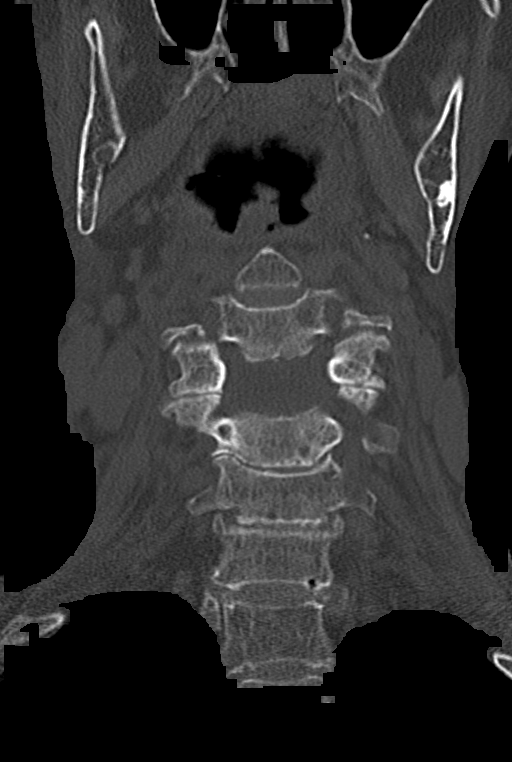
[im 38/66  bone]
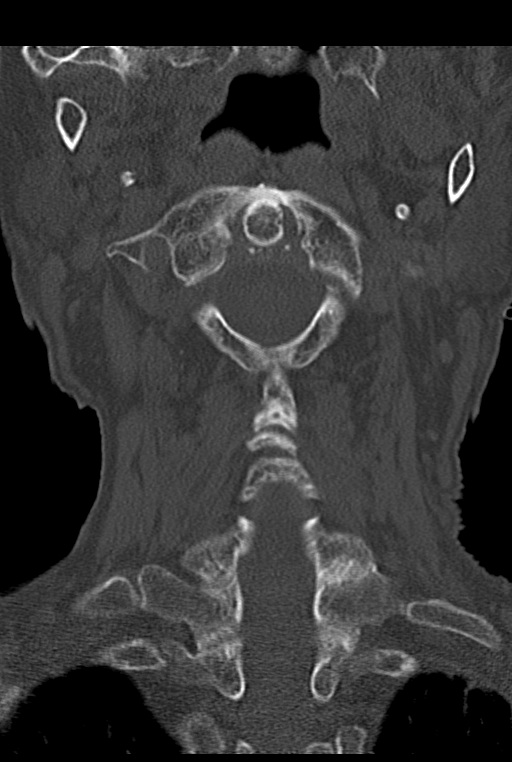

[Series 8: sagittal bone · sagittal · 0.29mm/px · 5 of 61 slices shown, 6 images]
[im 21/61  bone]
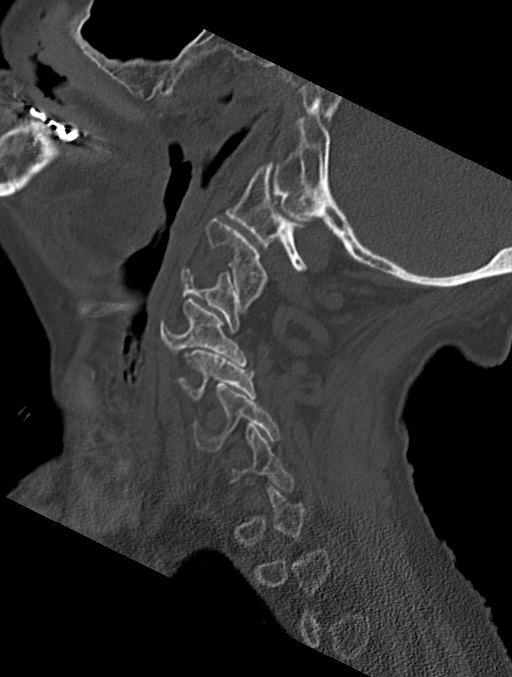
[im 26/61  bone]
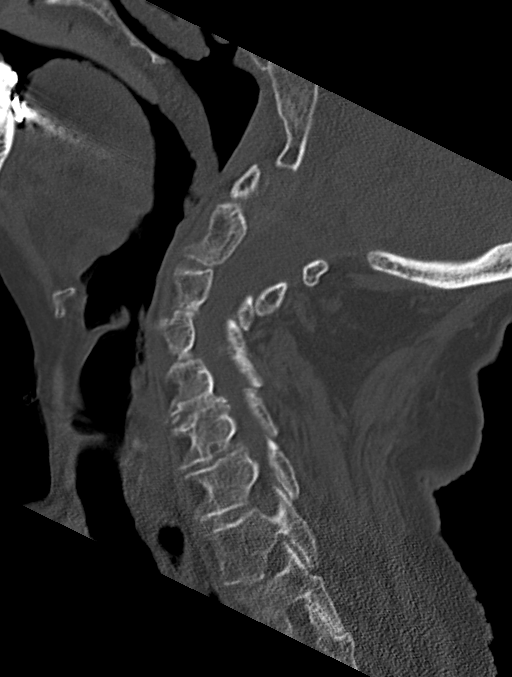
[im 31/61  soft-tissue]
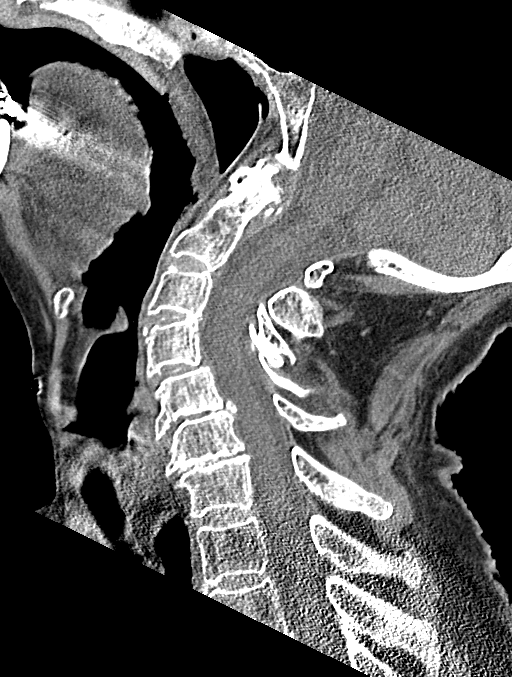
[im 31/61  bone]
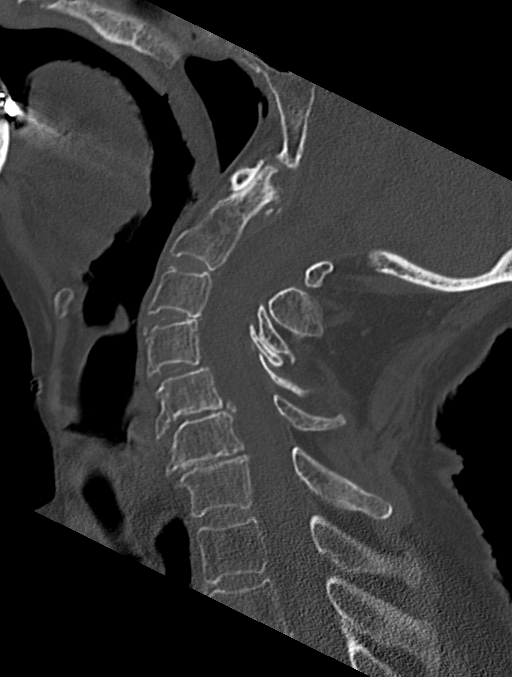
[im 36/61  bone]
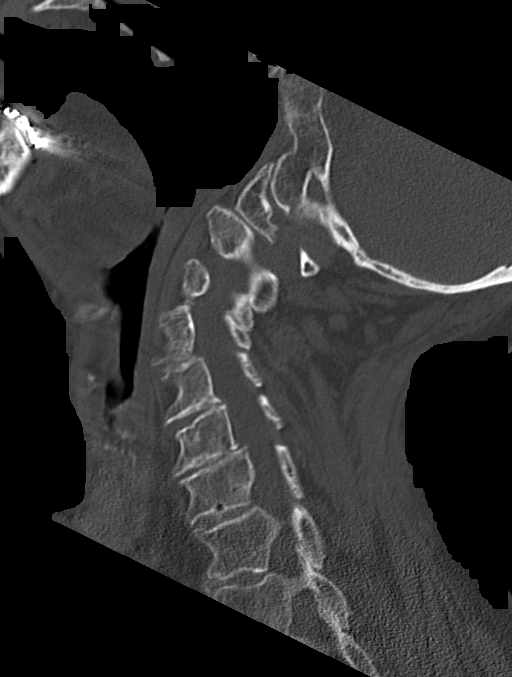
[im 41/61  bone]
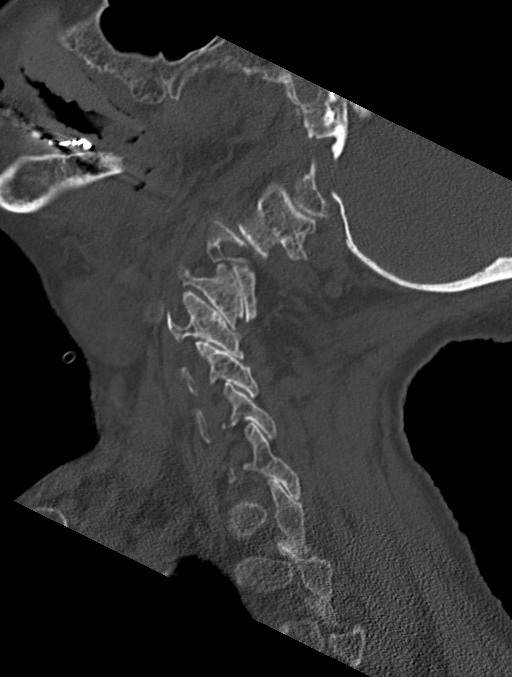

[12 of 33 positions shown; findings below may reference images not displayed]

FINDINGS: Alignment: There is 2 mm of anterolisthesis at C4-C5 which is
unchanged from prior examination and favored is degenerative.
Alignment is otherwise anatomic.

Skull base and vertebrae: The bones are diffusely osteopenic,
unchanged. No acute fracture. No primary bone lesion or focal
pathologic process.

Soft tissues and spinal canal: No prevertebral fluid or swelling. No
visible canal hematoma.

Disc levels: Disc space narrowing and osteophyte formation is seen
throughout the cervical spine most significant at C5-C6 and C6-C7,
unchanged. There is no severe central canal or neural foraminal
stenosis at any level.

Upper chest: Negative.

Other: None.
IMPRESSION: No acute fracture or traumatic subluxation of the cervical spine.

## 2022-03-09 IMAGING — DX DG CHEST 1V PORT
1 series · 1 of 1 positions shown · non-contrast
Comparison: Prior chest x-ray 08/06/2020

CLINICAL DATA: Shortness of breath, increased confusion

EXAM:
PORTABLE CHEST 1 VIEW

[chest ap]
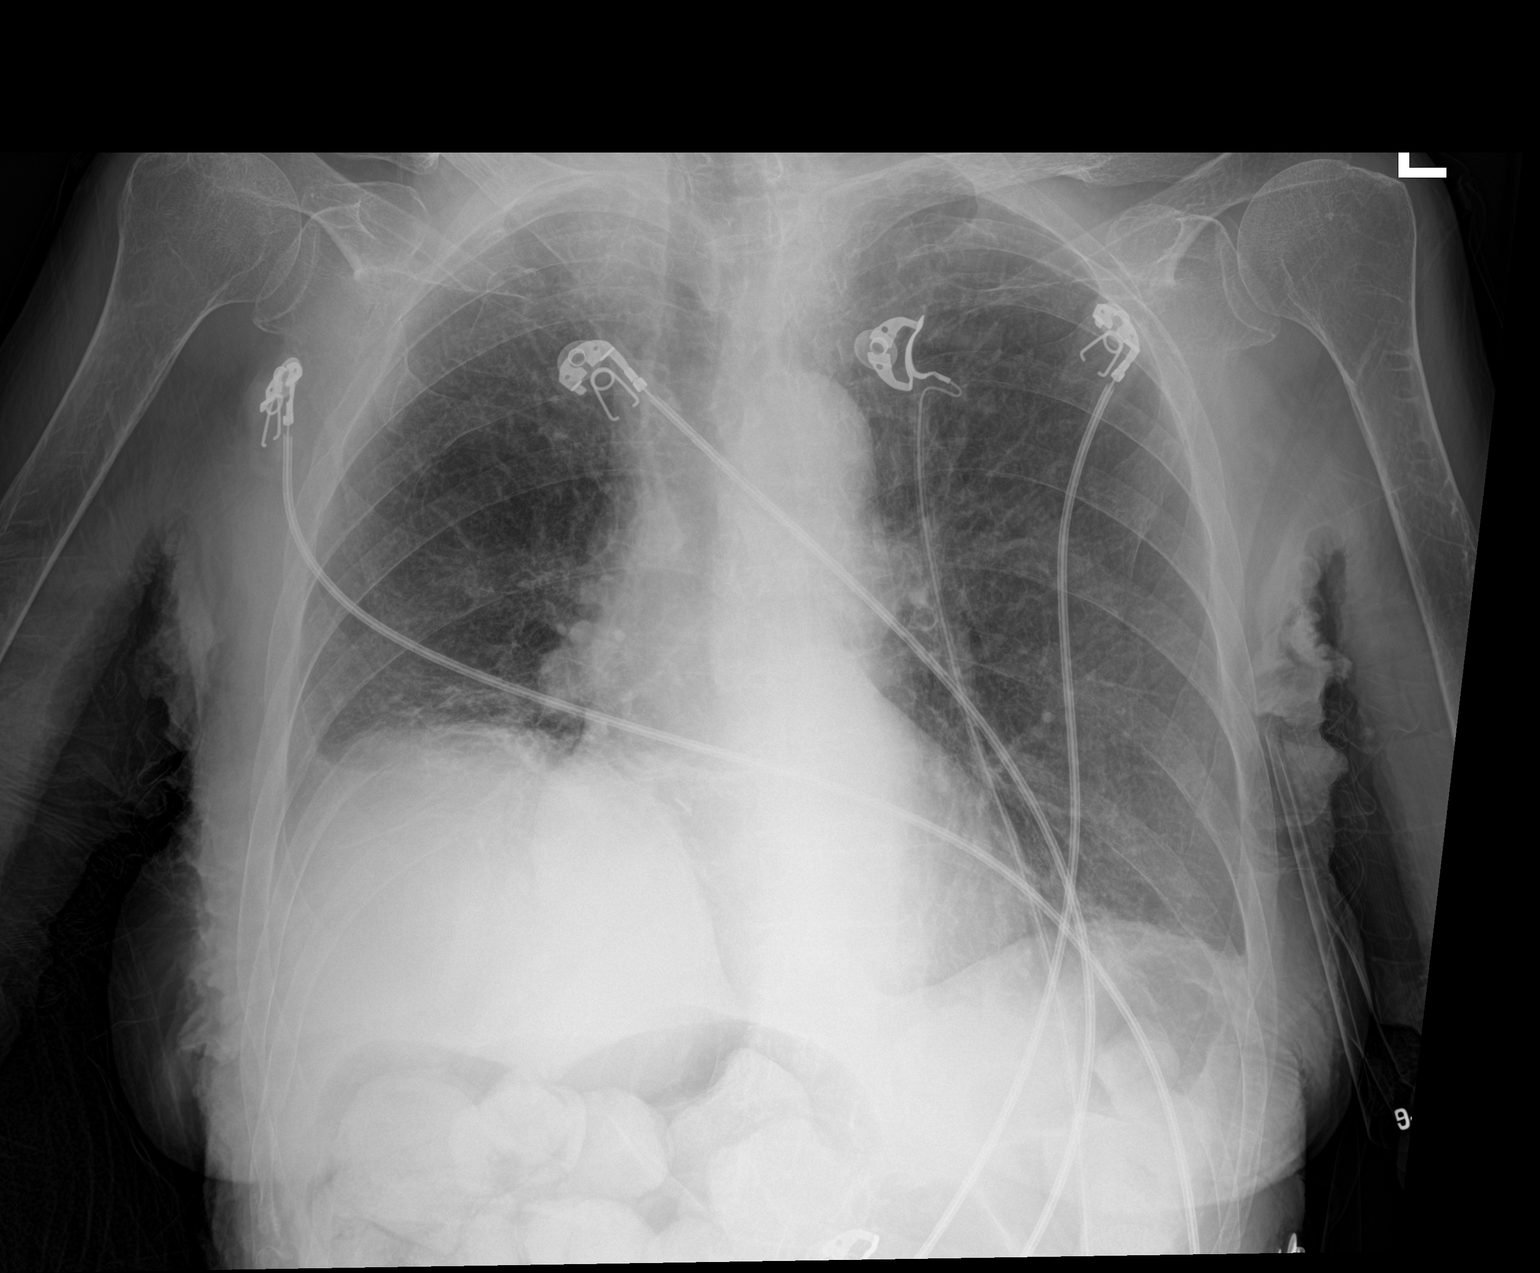

[1 of 1 positions shown; findings below may reference images not displayed]

FINDINGS: Chronic elevation of the right hemidiaphragm. Cardiac and
mediastinal contours are within normal limits. Chronic bronchitic
changes and mild interstitial prominence is similar compared to
prior. No new focal airspace infiltrate, pleural effusion or
pneumothorax. No acute osseous abnormality.
IMPRESSION: Stable chest x-ray without evidence of acute cardiopulmonary
process.
